# Patient Record
Sex: Male | Born: 1935 | Race: White | Hispanic: No | Marital: Married | Smoking: Former smoker
Health system: Southern US, Community
[De-identification: ages and names within clinical notes are randomized; demographics above are authoritative.]

## PROBLEM LIST (undated history)

## (undated) DIAGNOSIS — I5023 Acute on chronic systolic (congestive) heart failure: Secondary | ICD-10-CM

## (undated) DIAGNOSIS — J449 Chronic obstructive pulmonary disease, unspecified: Secondary | ICD-10-CM

## (undated) DIAGNOSIS — C189 Malignant neoplasm of colon, unspecified: Secondary | ICD-10-CM

## (undated) DIAGNOSIS — I219 Acute myocardial infarction, unspecified: Secondary | ICD-10-CM

## (undated) DIAGNOSIS — E785 Hyperlipidemia, unspecified: Secondary | ICD-10-CM

## (undated) DIAGNOSIS — I2510 Atherosclerotic heart disease of native coronary artery without angina pectoris: Secondary | ICD-10-CM

## (undated) DIAGNOSIS — I2589 Other forms of chronic ischemic heart disease: Secondary | ICD-10-CM

## (undated) HISTORY — DX: Hyperlipidemia, unspecified: E78.5

## (undated) HISTORY — DX: Other forms of chronic ischemic heart disease: I25.89

## (undated) HISTORY — PX: APPENDECTOMY: SHX54

## (undated) HISTORY — DX: Acute on chronic systolic (congestive) heart failure: I50.23

## (undated) HISTORY — DX: Atherosclerotic heart disease of native coronary artery without angina pectoris: I25.10

## (undated) HISTORY — PX: OTHER SURGICAL HISTORY: SHX169

## (undated) HISTORY — DX: Acute myocardial infarction, unspecified: I21.9

## (undated) HISTORY — DX: Malignant neoplasm of colon, unspecified: C18.9

## (undated) HISTORY — PX: TONSILLECTOMY: SUR1361

## (undated) HISTORY — DX: Chronic obstructive pulmonary disease, unspecified: J44.9

---

## 2006-07-06 DIAGNOSIS — C189 Malignant neoplasm of colon, unspecified: Secondary | ICD-10-CM

## 2006-07-06 HISTORY — DX: Malignant neoplasm of colon, unspecified: C18.9

## 2007-02-14 ENCOUNTER — Encounter: Admission: RE | Admit: 2007-02-14 | Discharge: 2007-02-14 | Payer: Self-pay | Admitting: Endocrinology

## 2007-04-29 ENCOUNTER — Encounter: Admission: RE | Admit: 2007-04-29 | Discharge: 2007-05-03 | Payer: Self-pay | Admitting: Surgery

## 2007-05-03 ENCOUNTER — Encounter (INDEPENDENT_AMBULATORY_CARE_PROVIDER_SITE_OTHER): Payer: Self-pay | Admitting: Surgery

## 2007-05-03 ENCOUNTER — Inpatient Hospital Stay (HOSPITAL_COMMUNITY): Admission: RE | Admit: 2007-05-03 | Discharge: 2007-05-07 | Payer: Self-pay | Admitting: Surgery

## 2007-05-12 ENCOUNTER — Ambulatory Visit: Payer: Self-pay | Admitting: Hematology & Oncology

## 2007-09-20 ENCOUNTER — Ambulatory Visit: Payer: Self-pay | Admitting: Hematology & Oncology

## 2007-09-22 LAB — CBC WITH DIFFERENTIAL/PLATELET
Eosinophils Absolute: 0.1 10*3/uL (ref 0.0–0.5)
HCT: 40.7 % (ref 38.7–49.9)
HGB: 14.1 g/dL (ref 13.0–17.1)
LYMPH%: 55.6 % — ABNORMAL HIGH (ref 14.0–48.0)
MONO#: 0.5 10*3/uL (ref 0.1–0.9)
NEUT#: 1.6 10*3/uL (ref 1.5–6.5)
Platelets: 128 10*3/uL — ABNORMAL LOW (ref 145–400)
RBC: 4.49 10*6/uL (ref 4.20–5.71)
WBC: 5.2 10*3/uL (ref 4.0–10.0)

## 2007-09-22 LAB — COMPREHENSIVE METABOLIC PANEL
Albumin: 3.4 g/dL — ABNORMAL LOW (ref 3.5–5.2)
CO2: 24 mEq/L (ref 19–32)
Glucose, Bld: 126 mg/dL — ABNORMAL HIGH (ref 70–99)
Sodium: 141 mEq/L (ref 135–145)
Total Bilirubin: 0.6 mg/dL (ref 0.3–1.2)
Total Protein: 6.3 g/dL (ref 6.0–8.3)

## 2007-09-22 LAB — CEA: CEA: <0.5 ng/mL (ref 0.0–5.0)

## 2007-09-26 ENCOUNTER — Ambulatory Visit (HOSPITAL_COMMUNITY): Admission: RE | Admit: 2007-09-26 | Discharge: 2007-09-26 | Payer: Self-pay | Admitting: Hematology & Oncology

## 2008-01-11 ENCOUNTER — Ambulatory Visit: Payer: Self-pay | Admitting: Hematology & Oncology

## 2008-01-16 ENCOUNTER — Ambulatory Visit (HOSPITAL_COMMUNITY): Admission: RE | Admit: 2008-01-16 | Discharge: 2008-01-16 | Payer: Self-pay | Admitting: Hematology & Oncology

## 2008-01-16 LAB — COMPREHENSIVE METABOLIC PANEL
ALT: 18 U/L (ref 0–53)
Albumin: 2.9 g/dL — ABNORMAL LOW (ref 3.5–5.2)
CO2: 29 mEq/L (ref 19–32)
Calcium: 9.3 mg/dL (ref 8.4–10.5)
Chloride: 107 mEq/L (ref 96–112)
Sodium: 140 mEq/L (ref 135–145)
Total Protein: 6.2 g/dL (ref 6.0–8.3)

## 2008-01-16 LAB — CBC WITH DIFFERENTIAL/PLATELET
BASO%: 0.7 % (ref 0.0–2.0)
Eosinophils Absolute: 0.2 10*3/uL (ref 0.0–0.5)
HCT: 41.5 % (ref 38.7–49.9)
MCHC: 34.1 g/dL (ref 32.0–35.9)
MONO#: 0.6 10*3/uL (ref 0.1–0.9)
NEUT#: 1.5 10*3/uL (ref 1.5–6.5)
RBC: 4.5 10*6/uL (ref 4.20–5.71)
WBC: 4.9 10*3/uL (ref 4.0–10.0)
lymph#: 2.6 10*3/uL (ref 0.9–3.3)

## 2008-01-16 LAB — CEA: CEA: 0.6 ng/mL (ref 0.0–5.0)

## 2008-01-24 ENCOUNTER — Ambulatory Visit: Payer: Self-pay | Admitting: Hematology & Oncology

## 2008-05-11 ENCOUNTER — Ambulatory Visit: Payer: Self-pay | Admitting: Hematology & Oncology

## 2008-05-14 ENCOUNTER — Ambulatory Visit (HOSPITAL_BASED_OUTPATIENT_CLINIC_OR_DEPARTMENT_OTHER): Admission: RE | Admit: 2008-05-14 | Discharge: 2008-05-14 | Payer: Self-pay | Admitting: Hematology & Oncology

## 2008-05-14 LAB — CBC WITH DIFFERENTIAL (CANCER CENTER ONLY)
BASO#: 0.1 10*3/uL (ref 0.0–0.2)
EOS%: 5.8 % (ref 0.0–7.0)
Eosinophils Absolute: 0.3 10*3/uL (ref 0.0–0.5)
HCT: 42.3 % (ref 38.7–49.9)
HGB: 14.2 g/dL (ref 13.0–17.1)
LYMPH#: 2.9 10*3/uL (ref 0.9–3.3)
MCH: 30.9 pg (ref 28.0–33.4)
MCHC: 33.7 g/dL (ref 32.0–35.9)
NEUT%: 29.1 % — ABNORMAL LOW (ref 40.0–80.0)
RBC: 4.6 10*6/uL (ref 4.20–5.70)

## 2008-05-14 LAB — CMP (CANCER CENTER ONLY)
ALT(SGPT): 19 U/L (ref 10–47)
CO2: 30 mEq/L (ref 18–33)
Calcium: 9.2 mg/dL (ref 8.0–10.3)
Chloride: 108 mEq/L (ref 98–108)
Creat: 1.4 mg/dl — ABNORMAL HIGH (ref 0.6–1.2)
Glucose, Bld: 93 mg/dL (ref 73–118)
Total Protein: 6.9 g/dL (ref 6.4–8.1)

## 2008-05-14 LAB — CEA: CEA: 0.5 ng/mL (ref 0.0–5.0)

## 2008-10-04 DIAGNOSIS — I219 Acute myocardial infarction, unspecified: Secondary | ICD-10-CM

## 2008-10-04 HISTORY — DX: Acute myocardial infarction, unspecified: I21.9

## 2008-10-22 ENCOUNTER — Ambulatory Visit: Payer: Self-pay | Admitting: Hematology & Oncology

## 2008-10-23 ENCOUNTER — Ambulatory Visit (HOSPITAL_BASED_OUTPATIENT_CLINIC_OR_DEPARTMENT_OTHER): Admission: RE | Admit: 2008-10-23 | Discharge: 2008-10-23 | Payer: Self-pay | Admitting: Hematology & Oncology

## 2008-10-23 ENCOUNTER — Ambulatory Visit: Payer: Self-pay | Admitting: Diagnostic Radiology

## 2008-10-23 LAB — CMP (CANCER CENTER ONLY)
AST: 27 U/L (ref 11–38)
Albumin: 3.2 g/dL — ABNORMAL LOW (ref 3.3–5.5)
Alkaline Phosphatase: 128 U/L — ABNORMAL HIGH (ref 26–84)
BUN, Bld: 18 mg/dL (ref 7–22)
Potassium: 5 mEq/L — ABNORMAL HIGH (ref 3.3–4.7)
Sodium: 144 mEq/L (ref 128–145)

## 2008-10-23 LAB — CBC WITH DIFFERENTIAL (CANCER CENTER ONLY)
BASO#: 0 10*3/uL (ref 0.0–0.2)
BASO%: 0.6 % (ref 0.0–2.0)
HCT: 46 % (ref 38.7–49.9)
HGB: 15.3 g/dL (ref 13.0–17.1)
LYMPH#: 3.4 10*3/uL — ABNORMAL HIGH (ref 0.9–3.3)
MONO#: 0.4 10*3/uL (ref 0.1–0.9)
NEUT%: 23.9 % — ABNORMAL LOW (ref 40.0–80.0)
WBC: 5.2 10*3/uL (ref 4.0–10.0)

## 2009-01-03 LAB — HM COLONOSCOPY: HM Colonoscopy: NORMAL

## 2009-03-25 ENCOUNTER — Encounter: Admission: RE | Admit: 2009-03-25 | Discharge: 2009-03-25 | Payer: Self-pay | Admitting: Endocrinology

## 2009-03-26 ENCOUNTER — Encounter: Admission: RE | Admit: 2009-03-26 | Discharge: 2009-03-26 | Payer: Self-pay | Admitting: Endocrinology

## 2009-04-05 ENCOUNTER — Ambulatory Visit: Payer: Self-pay | Admitting: Internal Medicine

## 2009-04-05 ENCOUNTER — Inpatient Hospital Stay (HOSPITAL_COMMUNITY): Admission: AD | Admit: 2009-04-05 | Discharge: 2009-04-10 | Payer: Self-pay | Admitting: Internal Medicine

## 2009-04-06 ENCOUNTER — Encounter (INDEPENDENT_AMBULATORY_CARE_PROVIDER_SITE_OTHER): Payer: Self-pay | Admitting: Internal Medicine

## 2009-04-06 LAB — CONVERTED CEMR LAB
Cholesterol: 130 mg/dL
HDL: 40 mg/dL
LDL Cholesterol: 81 mg/dL

## 2009-04-08 DIAGNOSIS — I2510 Atherosclerotic heart disease of native coronary artery without angina pectoris: Secondary | ICD-10-CM

## 2009-04-08 HISTORY — DX: Atherosclerotic heart disease of native coronary artery without angina pectoris: I25.10

## 2009-04-09 ENCOUNTER — Encounter: Payer: Self-pay | Admitting: Cardiovascular Disease

## 2009-04-11 ENCOUNTER — Telehealth: Payer: Self-pay | Admitting: Cardiovascular Disease

## 2009-04-18 ENCOUNTER — Telehealth: Payer: Self-pay | Admitting: Cardiovascular Disease

## 2009-04-23 DIAGNOSIS — Z87898 Personal history of other specified conditions: Secondary | ICD-10-CM

## 2009-04-23 DIAGNOSIS — I5042 Chronic combined systolic (congestive) and diastolic (congestive) heart failure: Secondary | ICD-10-CM | POA: Insufficient documentation

## 2009-04-23 DIAGNOSIS — E785 Hyperlipidemia, unspecified: Secondary | ICD-10-CM

## 2009-04-23 DIAGNOSIS — J439 Emphysema, unspecified: Secondary | ICD-10-CM

## 2009-04-23 DIAGNOSIS — I219 Acute myocardial infarction, unspecified: Secondary | ICD-10-CM | POA: Insufficient documentation

## 2009-04-24 ENCOUNTER — Encounter: Payer: Self-pay | Admitting: Internal Medicine

## 2009-04-29 ENCOUNTER — Ambulatory Visit: Payer: Self-pay | Admitting: Cardiovascular Disease

## 2009-04-29 DIAGNOSIS — I2510 Atherosclerotic heart disease of native coronary artery without angina pectoris: Secondary | ICD-10-CM

## 2009-05-01 ENCOUNTER — Telehealth: Payer: Self-pay | Admitting: Cardiovascular Disease

## 2009-05-06 DIAGNOSIS — J449 Chronic obstructive pulmonary disease, unspecified: Secondary | ICD-10-CM

## 2009-05-06 HISTORY — DX: Chronic obstructive pulmonary disease, unspecified: J44.9

## 2009-05-07 ENCOUNTER — Encounter: Payer: Self-pay | Admitting: Cardiovascular Disease

## 2009-05-14 ENCOUNTER — Telehealth: Payer: Self-pay | Admitting: Cardiovascular Disease

## 2009-05-20 ENCOUNTER — Ambulatory Visit: Payer: Self-pay | Admitting: Cardiovascular Disease

## 2009-05-20 ENCOUNTER — Encounter: Payer: Self-pay | Admitting: Cardiovascular Disease

## 2009-05-20 ENCOUNTER — Ambulatory Visit: Payer: Self-pay | Admitting: Pulmonary Disease

## 2009-05-20 ENCOUNTER — Inpatient Hospital Stay (HOSPITAL_COMMUNITY): Admission: AD | Admit: 2009-05-20 | Discharge: 2009-05-23 | Payer: Self-pay | Admitting: Cardiovascular Disease

## 2009-05-21 ENCOUNTER — Encounter: Payer: Self-pay | Admitting: Cardiovascular Disease

## 2009-05-22 ENCOUNTER — Encounter: Payer: Self-pay | Admitting: Cardiovascular Disease

## 2009-05-23 LAB — CONVERTED CEMR LAB
Hemoglobin: 12.4 g/dL
MCV: 94.2 fL
Platelets: 144 10*3/uL

## 2009-05-29 ENCOUNTER — Ambulatory Visit: Payer: Self-pay | Admitting: Cardiovascular Disease

## 2009-05-29 DIAGNOSIS — I5023 Acute on chronic systolic (congestive) heart failure: Secondary | ICD-10-CM | POA: Insufficient documentation

## 2009-06-04 ENCOUNTER — Encounter: Payer: Self-pay | Admitting: Cardiovascular Disease

## 2009-06-04 LAB — CONVERTED CEMR LAB
Calcium: 8.5 mg/dL (ref 8.4–10.5)
GFR calc non Af Amer: 44.21 mL/min (ref >60)
Potassium: 4.3 meq/L (ref 3.5–5.1)
Sodium: 135 meq/L (ref 135–145)

## 2009-06-06 ENCOUNTER — Ambulatory Visit: Payer: Self-pay | Admitting: Hematology & Oncology

## 2009-06-07 ENCOUNTER — Ambulatory Visit: Payer: Self-pay | Admitting: Diagnostic Radiology

## 2009-06-07 ENCOUNTER — Ambulatory Visit (HOSPITAL_BASED_OUTPATIENT_CLINIC_OR_DEPARTMENT_OTHER): Admission: RE | Admit: 2009-06-07 | Discharge: 2009-06-07 | Payer: Self-pay | Admitting: Hematology & Oncology

## 2009-06-07 LAB — CBC WITH DIFFERENTIAL (CANCER CENTER ONLY)
BASO%: 0.5 % (ref 0.0–2.0)
HCT: 41.5 % (ref 38.7–49.9)
LYMPH#: 2.2 10*3/uL (ref 0.9–3.3)
MONO#: 0.6 10*3/uL (ref 0.1–0.9)
NEUT#: 2.4 10*3/uL (ref 1.5–6.5)
Platelets: 98 10*3/uL — ABNORMAL LOW (ref 145–400)
RDW: 12.5 % (ref 10.5–14.6)
WBC: 5.6 10*3/uL (ref 4.0–10.0)

## 2009-06-07 LAB — CMP (CANCER CENTER ONLY)
AST: 28 U/L (ref 11–38)
Albumin: 3.3 g/dL (ref 3.3–5.5)
BUN, Bld: 15 mg/dL (ref 7–22)
Calcium: 9.3 mg/dL (ref 8.0–10.3)
Chloride: 100 mEq/L (ref 98–108)
Glucose, Bld: 96 mg/dL (ref 73–118)
Potassium: 4.1 mEq/L (ref 3.3–4.7)

## 2009-06-10 ENCOUNTER — Telehealth (INDEPENDENT_AMBULATORY_CARE_PROVIDER_SITE_OTHER): Payer: Self-pay | Admitting: *Deleted

## 2009-06-14 ENCOUNTER — Ambulatory Visit: Payer: Self-pay | Admitting: Pulmonary Disease

## 2009-06-14 DIAGNOSIS — J90 Pleural effusion, not elsewhere classified: Secondary | ICD-10-CM | POA: Insufficient documentation

## 2009-06-14 DIAGNOSIS — G4710 Hypersomnia, unspecified: Secondary | ICD-10-CM | POA: Insufficient documentation

## 2009-07-03 ENCOUNTER — Ambulatory Visit: Payer: Self-pay | Admitting: Internal Medicine

## 2009-07-03 DIAGNOSIS — Z87891 Personal history of nicotine dependence: Secondary | ICD-10-CM

## 2009-07-03 DIAGNOSIS — J438 Other emphysema: Secondary | ICD-10-CM

## 2009-07-10 ENCOUNTER — Ambulatory Visit (HOSPITAL_COMMUNITY): Admission: RE | Admit: 2009-07-10 | Discharge: 2009-07-10 | Payer: Self-pay | Admitting: Cardiovascular Disease

## 2009-07-10 ENCOUNTER — Encounter: Payer: Self-pay | Admitting: Cardiovascular Disease

## 2009-07-10 ENCOUNTER — Ambulatory Visit: Payer: Self-pay | Admitting: Cardiology

## 2009-07-10 ENCOUNTER — Ambulatory Visit: Payer: Self-pay

## 2009-07-10 ENCOUNTER — Ambulatory Visit: Payer: Self-pay | Admitting: Cardiovascular Disease

## 2009-07-10 LAB — CONVERTED CEMR LAB
BUN: 24 mg/dL — ABNORMAL HIGH (ref 6–23)
Chloride: 105 meq/L (ref 96–112)
GFR calc non Af Amer: 51.56 mL/min (ref >60)
Potassium: 3.9 meq/L (ref 3.5–5.1)
Sodium: 140 meq/L (ref 135–145)

## 2009-07-11 ENCOUNTER — Encounter: Payer: Self-pay | Admitting: Cardiovascular Disease

## 2009-07-17 ENCOUNTER — Telehealth: Payer: Self-pay | Admitting: Pulmonary Disease

## 2009-07-29 ENCOUNTER — Telehealth: Payer: Self-pay | Admitting: Cardiovascular Disease

## 2009-07-29 ENCOUNTER — Telehealth (INDEPENDENT_AMBULATORY_CARE_PROVIDER_SITE_OTHER): Payer: Self-pay | Admitting: *Deleted

## 2009-08-13 ENCOUNTER — Ambulatory Visit: Payer: Self-pay | Admitting: Pulmonary Disease

## 2009-08-16 ENCOUNTER — Telehealth (INDEPENDENT_AMBULATORY_CARE_PROVIDER_SITE_OTHER): Payer: Self-pay | Admitting: *Deleted

## 2009-09-16 ENCOUNTER — Telehealth: Payer: Self-pay | Admitting: Cardiovascular Disease

## 2009-10-01 ENCOUNTER — Ambulatory Visit: Payer: Self-pay | Admitting: Internal Medicine

## 2009-10-01 LAB — CONVERTED CEMR LAB
ALT: 15 units/L (ref 0–53)
AST: 22 units/L (ref 0–37)
Alkaline Phosphatase: 83 units/L (ref 39–117)
Bilirubin, Direct: 0.1 mg/dL (ref 0.0–0.3)
Total Bilirubin: 0.8 mg/dL (ref 0.3–1.2)

## 2009-12-12 ENCOUNTER — Ambulatory Visit: Payer: Self-pay | Admitting: Pulmonary Disease

## 2010-01-09 ENCOUNTER — Ambulatory Visit: Payer: Self-pay | Admitting: Cardiovascular Disease

## 2010-01-17 ENCOUNTER — Telehealth: Payer: Self-pay | Admitting: Cardiovascular Disease

## 2010-01-28 ENCOUNTER — Ambulatory Visit: Payer: Self-pay | Admitting: Internal Medicine

## 2010-04-02 ENCOUNTER — Telehealth: Payer: Self-pay | Admitting: Cardiovascular Disease

## 2010-04-09 ENCOUNTER — Telehealth: Payer: Self-pay | Admitting: Internal Medicine

## 2010-04-10 ENCOUNTER — Encounter: Payer: Self-pay | Admitting: Internal Medicine

## 2010-04-14 ENCOUNTER — Ambulatory Visit: Payer: Self-pay | Admitting: Pulmonary Disease

## 2010-05-05 ENCOUNTER — Ambulatory Visit: Payer: Self-pay | Admitting: Cardiovascular Disease

## 2010-05-20 ENCOUNTER — Ambulatory Visit: Payer: Self-pay | Admitting: Internal Medicine

## 2010-05-20 DIAGNOSIS — L259 Unspecified contact dermatitis, unspecified cause: Secondary | ICD-10-CM

## 2010-05-23 LAB — CONVERTED CEMR LAB
HDL: 44.3 mg/dL (ref >39.00)
LDL Cholesterol: 90 mg/dL (ref 0–99)
Total CHOL/HDL Ratio: 3
Triglycerides: 62 mg/dL (ref 0.0–149.0)
VLDL: 12.4 mg/dL (ref 0.0–40.0)

## 2010-06-04 IMAGING — CR DG CHEST 2V
2 series · 2 of 2 positions shown · non-contrast
Comparison: Chest radiograph 04/05/2009

CLINICAL DATA: Congestive heart failure

CHEST - 2 VIEW

[w chest pa]
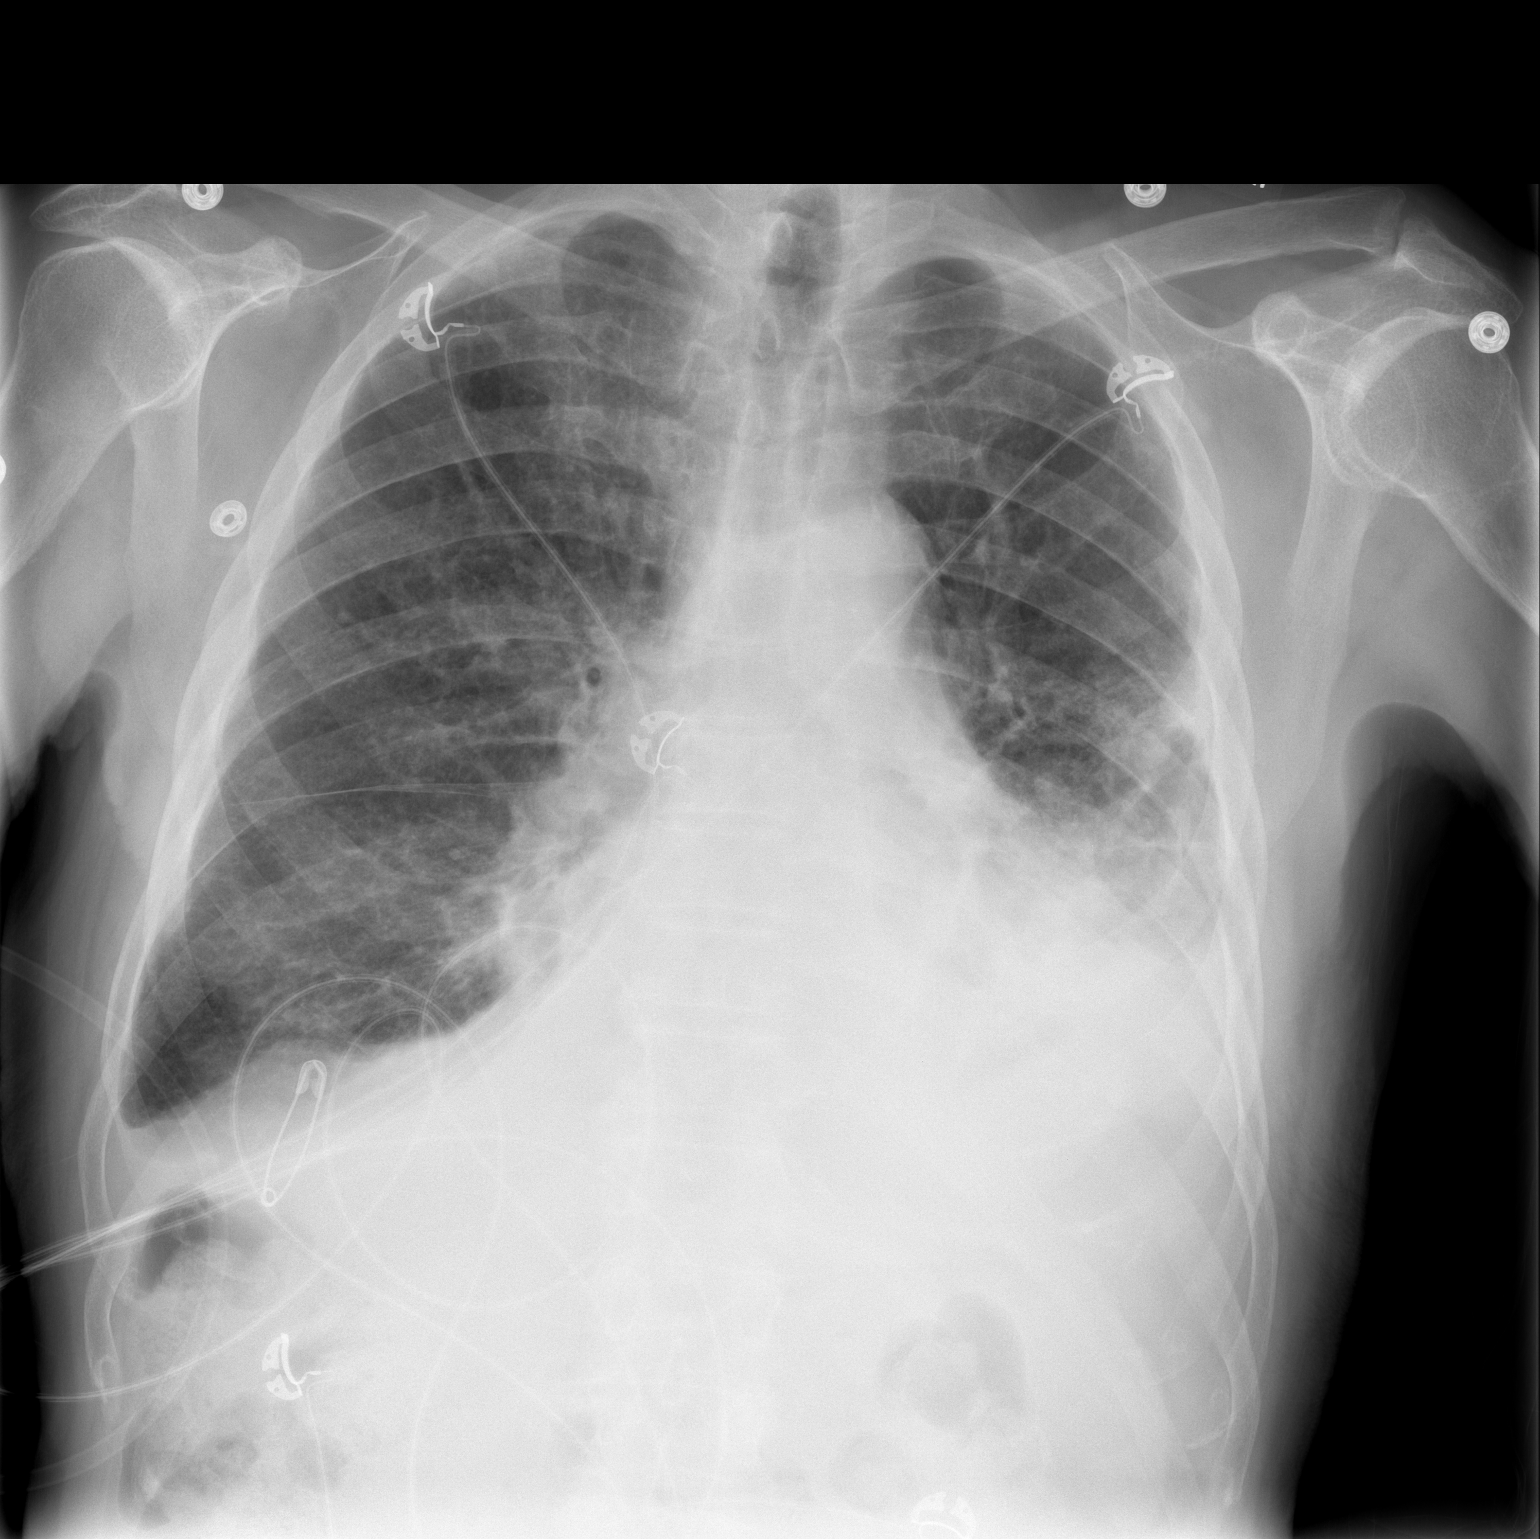

[w chest lat]
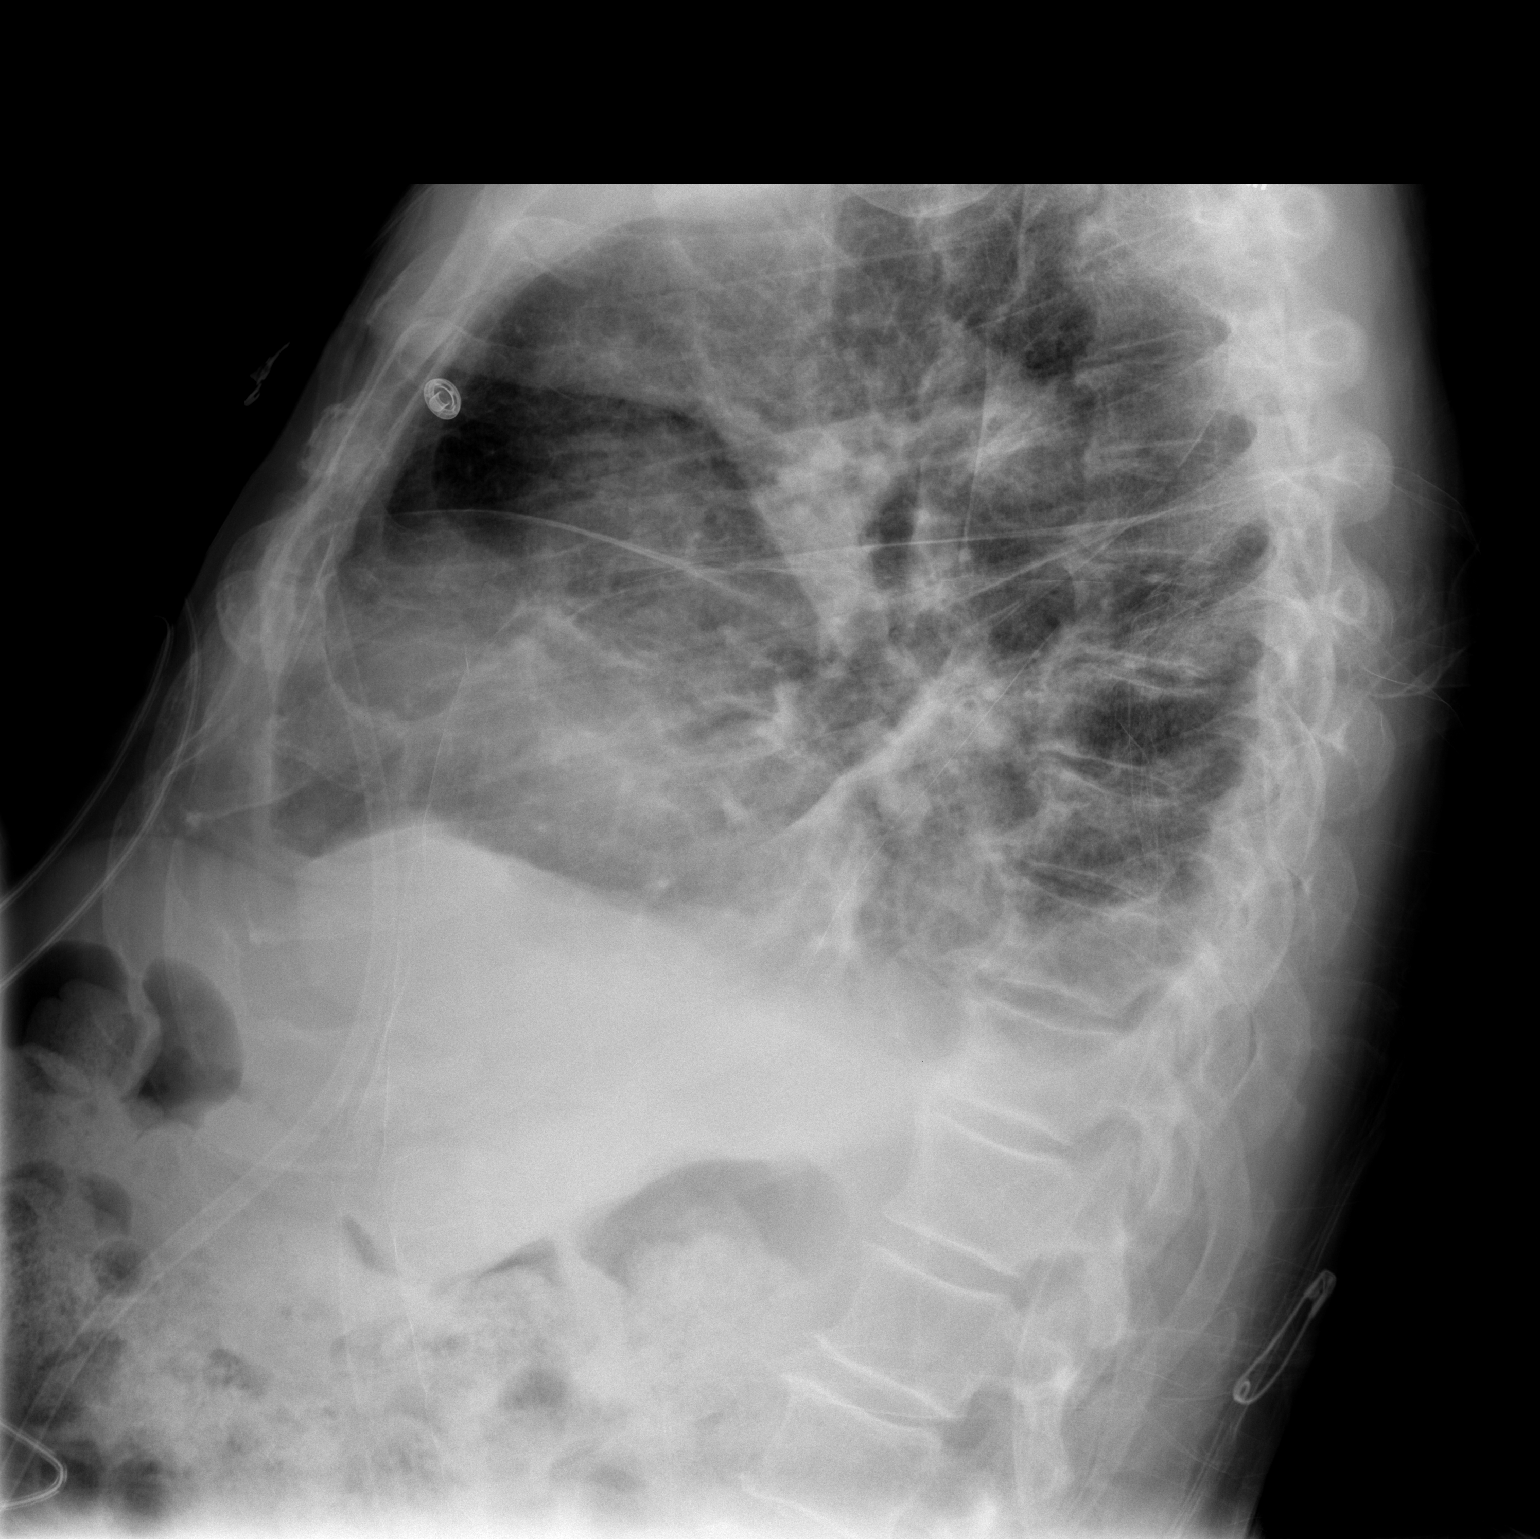

[2 of 2 positions shown; findings below may reference images not displayed]

FINDINGS: Stable enlarged cardiac silhouette.  There is left lower
lobe atelectasis and effusion not changed from prior.  Interval
improvement in atelectasis the right lung base.  No pneumothorax.
IMPRESSION: 1.  Improved aeration of the right lung base.
2.  Persistent dense left lower lobe atelectasis and effusion.

## 2010-07-04 ENCOUNTER — Telehealth: Payer: Self-pay | Admitting: Internal Medicine

## 2010-08-05 NOTE — Progress Notes (Signed)
Summary: refill  Phone Note Call from Patient   Caller: Spouse Call For: Cybil Senegal Summary of Call: Wants to know if he needs a refill on his benzonatate. Initial call taken by: Darletta Moll,  July 17, 2009 10:23 AM  Follow-up for Phone Call        Pt wife states that the pt cough is much improved, he still coughs but not very often. She wanted to know if they needed to refill benzonatate. I advised if his cough is better then he doesnt need the med. Pt wife wanted me to ask RA what his recs were. Please advise. Carron Curie CMA  July 17, 2009 11:03 AM   Additional Follow-up for Phone Call Additional follow up Details #1::        OK x 3 months Additional Follow-up by: Comer Locket. Vassie Loll MD,  July 17, 2009 12:15 PM    Additional Follow-up for Phone Call Additional follow up Details #2::    wife aware med at the pharmacy, she also wanted to know if he needed to take med even if he was not coughing and i told wife to have pt use as needed for cough and discuss further with dr Vassie Loll at [facility] in feb Follow-up by: Philipp Deputy CMA,  July 17, 2009 2:17 PM  New/Updated Medications: BENZONATATE 200 MG CAPS (BENZONATATE) 1 by mouth at bedtime as needed for cough Prescriptions: BENZONATATE 200 MG CAPS (BENZONATATE) 1 by mouth at bedtime as needed for cough  #30 x 2   Entered by:   Philipp Deputy CMA   Authorized by:   Comer Locket Vassie Loll MD   Signed by:   Philipp Deputy CMA on 07/17/2009   Method used:   Electronically to        [facility]. [phone]* (retail)       [address] [place], [place]  [address]       Ph: [phone]       Fax: [phone]   RxID:   [id number]

## 2010-08-05 NOTE — Progress Notes (Signed)
Summary: RX QUESTIONS  Phone Note From Pharmacy Call back at [phone]   Caller: [facility] Call For: [facility]  Summary of Call: QUESTIONS ABOUT PRESCRIPTIONS THAT WERE RECENTLY CALLED IN. Initial call taken by: Harlon Flor,  September 16, 2009 2:12 PM  Follow-up for Phone Call        [facility] pharmacy. They did not know why Jonny Ruiz called and will have him call us back. Dossie Arbour, RN, BSN  September 16, 2009 3:54 PM Spoke with pharmacy. Clarified that  Lasix is 20 mg by mouth daily Follow-up by: Dossie Arbour, RN, BSN,  September 16, 2009 4:43 PM

## 2010-08-05 NOTE — Progress Notes (Signed)
Summary: CALLING ABOUT MEDICATION INHALER  Phone Note Call from Patient Call back at Home Phone [phone]   Caller: Patient Summary of Call: THE MEDICATION SPIRIVA THE PT WOULD LIKE THE PILLS NOT THE INHALER Initial call taken by: Judie Grieve,  July 29, 2009 9:34 AM  Follow-up for Phone Call        Spoke with pt's wife and asked her to contact Dr. Reginia Naas office for this as it is a pulmonary medicine. She agrees to do this Follow-up by: Dossie Arbour, RN, BSN,  July 29, 2009 9:56 AM

## 2010-08-05 NOTE — Assessment & Plan Note (Signed)
Summary: 4 MOS F/U // # / CD   Vital Signs:  Patient profile:   77 year old male Height:      64 inches (162.56 cm) Weight:      156.75 pounds (71.25 kg) BMI:     27.00 O2 Sat:      92 % on Room air Temp:     98.1 degrees F (36.72 degrees C) oral Pulse rate:   75 / minute BP sitting:   108 / 60  (left arm) Cuff size:   regular  Vitals Entered By: Brenton Grills MA (January 28, 2010 9:58 AM)  O2 Flow:  Room air CC: 4 mo F/U/aj   Primary Care Provider:  Rene Paci, MD  CC:  4 mo F/U/aj.  History of Present Illness: here for 3-6 mo followup - wife and dtr asst with hx as needed   1) COPD hx - feels breathing is baseline today much improved since hosp fall 2010 -  attributes Spiriva to his improved symptoms - no need for rescue MDI use chronic mild cough; no SOB, DOE or PND reports compliance with ongoing medical treatment and no changes in medication dose or frequency. denies adverse side effects related to current therapy.   2) NSTEMI 10/10 with CAD and Isch CM (chronic HF)- denies cp, pnd; improvement in chronic peiph edema - reports compliance with ongoing medical treatment and no changes in medication dose or frequency. denies adverse side effects related to current therapy. ?if able to reduce costs (in donute hole, esp plavix)  3) hx colon ca, s/p surg resection 2008 no bowel changes or diarrhea; no weight changes or hematocezia released by onc this year -   4) dyslipidemia- reports compliance with ongoing medical treatment and no changes in medication dose or frequency. denies adverse side effects related to current therapy. no muscle or GI problems   Clinical Review Panels:  Prevention   Last Colonoscopy:  Repeat colonoscopy by Dr. Kinnie Scales Results: Normal, scedule to repeat in 3 years (01/03/2009)  Immunizations   Last Tetanus Booster:  Td (07/03/2009)   Last Flu Vaccine:  Historical (04/04/2009)   Last Pneumovax:  Pneumovax (Medicare)  (07/03/2009)  Lipid Management   Cholesterol:  130 (04/06/2009)   LDL (bad choesterol):  81 (04/06/2009)   HDL (good cholesterol):  40 (04/06/2009)   Triglycerides:  43 (04/06/2009)  CBC   WBC:  12.4 (05/23/2009)   RBC:  3.88 (05/23/2009)   Hgb:  12.4 (05/23/2009)   Hct:  36.6 (05/23/2009)   Platelets:  144 (05/23/2009)   MCV  94.2 (05/23/2009)   RDW  14.5 (05/23/2009)  Complete Metabolic Panel   Glucose:  86 (07/10/2009)   Sodium:  140 (07/10/2009)   Potassium:  4.6 (10/01/2009)   Chloride:  105 (07/10/2009)   CO2:  28 (07/10/2009)   BUN:  24 (07/10/2009)   Creatinine:  1.5 (10/01/2009)   Albumin:  3.3 (10/01/2009)   Total Protein:  6.5 (10/01/2009)   Calcium:  9.2 (07/10/2009)   Total Bili:  0.8 (10/01/2009)   Alk Phos:  83 (10/01/2009)   SGPT (ALT):  15 (10/01/2009)   SGOT (AST):  22 (10/01/2009)   Current Medications (verified): 1)  Aspirin Ec 325 Mg Tbec (Aspirin) .... Take One Tablet By Mouth Daily 2)  Plavix 75 Mg Tabs (Clopidogrel Bisulfate) .Marland Kitchen.. 1 Tab Once Daily 3)  Crestor 10 Mg Tabs (Rosuvastatin Calcium) .Marland Kitchen.. 1 Tab At Bedtime 4)  Ventolin Hfa 108 (90 Base) Mcg/act  Aers (Albuterol Sulfate) .Marland KitchenMarland KitchenMarland Kitchen  1-2 Puffs Every 4-6 Hours As Needed 5)  Vitamin B-12 100 Mcg Tabs (Cyanocobalamin) .Marland Kitchen.. 1 Tab Once Daily 6)  Vitamin C 500 Mg Tabs (Ascorbic Acid) .Marland Kitchen.. 1 Tab Once Daily 7)  Vitamin D 2000 Unit Tabs (Cholecalciferol) .... Take 1 Tablet By Mouth Once A Day 8)  Vitamin E 400 Unit Caps (Vitamin E) .Marland Kitchen.. 1 Cap Once Daily 9)  Spiriva Handihaler 18 Mcg Caps (Tiotropium Bromide Monohydrate) .... Once A Day 10)  Furosemide 20 Mg Tabs (Furosemide) .... Take One Tablet By Mouth Daily. 11)  Potassium Chloride Crys Cr 20 Meq Cr-Tabs (Potassium Chloride Crys Cr) .... Take One Tablet By Mouth Daily 12)  Benzonatate 200 Mg Caps (Benzonatate) .Marland Kitchen.. 1 By Mouth At Bedtime As Needed For Cough  Allergies (verified): No Known Drug Allergies  Past History:  Past Medical  History: NonSTEMI  10/10  CAD, Native vessel s/p overlapping Xience DES stents mid Circumflex 04/08/09. CARDIOMYOPATHY, ISCHEMIC (ICD-414.8) DYSLIPIDEMIA (ICD-272.4) COPD (ICD-496) with exacerbation 11/10  BENIGN PROSTATIC HYPERTROPHY, HX OF  ADENOCARCINOMA, COLON, HX OF    MD roster: cards- McAlhany pulm- Vassie Loll GI- Medoff onc - Ennever  Review of Systems  The patient denies weight loss, hoarseness, chest pain, and prolonged cough.    Physical Exam  General:  alert, well-developed, well-nourished, and cooperative to examination.   wife and dtr at side Lungs:  normal respiratory effort, no intercostal retractions or use of accessory muscles; decreased breath sounds at bases bilaterally - but no crackles and no wheezes.    Heart:  normal rate, regular rhythm, no murmur, and no rub. BLE with trace chronic edema.  Psych:  Oriented X3, memory intact for recent and remote, normally interactive, good eye contact, not anxious appearing, not depressed appearing, and not agitated.      Impression & Recommendations:  Problem # 1:  CAD, NATIVE VESSEL (ICD-414.01)  His updated medication list for this problem includes:    Aspirin Ec 325 Mg Tbec (Aspirin) .Marland Kitchen... Take one tablet by mouth daily    Plavix 75 Mg Tabs (Clopidogrel bisulfate) .Marland Kitchen... 1 tab once daily    Furosemide 20 Mg Tabs (Furosemide) .Marland Kitchen... Take one tablet by mouth daily.  per cards OV earlier this month: Stable. Wlll need one full year of ASA/Plavix. (stents October 2010). He has not tolerated beta blockers secondary to hypotension.  family interested in stopping Plavix asap (if able) due to cost - will ask cards to f/u in same oct when due -  Labs Reviewed: Chol: 130 (04/06/2009)   HDL: 40 (04/06/2009)   LDL: 81 (04/06/2009)   TG: 43 (04/06/2009)  Problem # 2:  DYSLIPIDEMIA (ICD-272.4)  His updated medication list for this problem includes:    Crestor 10 Mg Tabs (Rosuvastatin calcium) .Marland Kitchen... 1 tab at bedtime  Labs  Reviewed: SGOT: 22 (10/01/2009)   SGPT: 15 (10/01/2009)   HDL:40 (04/06/2009)  LDL:81 (04/06/2009)  Chol:130 (04/06/2009)  Trig:43 (04/06/2009)  Problem # 3:  COPD (ICD-496)  much improved on spiriva - no alb rescue mdi needed- f/u pulm as ongoing His updated medication list for this problem includes:    Ventolin Hfa 108 (90 Base) Mcg/act Aers (Albuterol sulfate) .Marland Kitchen... 1-2 puffs every 4-6 hours as needed    Spiriva Handihaler 18 Mcg Caps (Tiotropium bromide monohydrate) ..... Once a day  Pulmonary Functions Reviewed: O2 sat: 92 (01/28/2010)     Time spent with patient and family 25 minutes, more than 50% of this time was spent counseling patient on medication review and  last labs  Vaccines Reviewed: Pneumovax: Pneumovax (Medicare) (07/03/2009)   Flu Vax: Historical (04/04/2009)  Complete Medication List: 1)  Aspirin Ec 325 Mg Tbec (Aspirin) .... Take one tablet by mouth daily 2)  Plavix 75 Mg Tabs (Clopidogrel bisulfate) .Marland Kitchen.. 1 tab once daily 3)  Crestor 10 Mg Tabs (Rosuvastatin calcium) .Marland Kitchen.. 1 tab at bedtime 4)  Ventolin Hfa 108 (90 Base) Mcg/act Aers (Albuterol sulfate) .Marland Kitchen.. 1-2 puffs every 4-6 hours as needed 5)  Vitamin B-12 100 Mcg Tabs (Cyanocobalamin) .Marland Kitchen.. 1 tab once daily 6)  Vitamin C 500 Mg Tabs (Ascorbic acid) .Marland Kitchen.. 1 tab once daily 7)  Vitamin D 2000 Unit Tabs (Cholecalciferol) .... Take 1 tablet by mouth once a day 8)  Vitamin E 400 Unit Caps (Vitamin e) .Marland Kitchen.. 1 cap once daily 9)  Spiriva Handihaler 18 Mcg Caps (Tiotropium bromide monohydrate) .... Once a day 10)  Furosemide 20 Mg Tabs (Furosemide) .... Take one tablet by mouth daily. 11)  Potassium Chloride Crys Cr 20 Meq Cr-tabs (Potassium chloride crys cr) .... Take one tablet by mouth daily 12)  Benzonatate 200 Mg Caps (Benzonatate) .Marland Kitchen.. 1 by mouth at bedtime as needed for cough  Patient Instructions: 1)  it was good to see you today. 2)  no medication changes - keep doing what you are doing 3)  Please schedule a  follow-up appointment in 4-6 months, sooner if problems.  will plan cholesterol labs at that visit (unless done elsewhere before then) so come fasting as needed  4)  will contact your cardiologist about stopping the Plavix in October (12 months after stent placed) - call them if questions about this prior to that time

## 2010-08-05 NOTE — Assessment & Plan Note (Signed)
Summary: 3-6 MTH FU---STC   Vital Signs:  Patient profile:   77 year old male Height:      64 inches (162.56 cm) Weight:      150.6 pounds (68.45 kg) O2 Sat:      97 % on Room air Temp:     97.8 degrees F (36.56 degrees C) oral Pulse rate:   811 / minute BP sitting:   102 / 60  (left arm) Cuff size:   regular  Vitals Entered By: Orlan Leavens (October 01, 2009 10:24 AM)  O2 Flow:  Room air CC: 3-6 MONTH FOLLOW-UP Is Patient Diabetic? No Pain Assessment Patient in pain? no        Primary Care Provider:  Rene Paci, MD  CC:  3-6 MONTH FOLLOW-UP.  History of Present Illness: here for 3-6 mo followup -  1) COPD hx - feels breathing is baseline today much improved since hosp this fall -  attributes Spiriva to his improved symptoms  chorinc cough - but improved; no SOB, DOE or PND reports compliance with ongoing medical treatment and no changes in medication dose or frequency. denies adverse side effects related to current therapy.   2) NSTEMI 10/10 with CAD and Isch CM (chronic HF) denies cp, pnd; improvement in chronic peiph edema - reports compliance with ongoing medical treatment and no changes in medication dose or frequency. denies adverse side effects related to current therapy.   3) hx colon ca, s/p surg rescetion 2008 no bowel changes or diarrhea released by onc this year -   4) dyslipidemia- reports compliance with ongoing medical treatment and no changes in medication dose or frequency. denies adverse side effects related to current therapy.   Clinical Review Panels:  Prevention   Last Colonoscopy:  Repeat colonoscopy by Dr. Kinnie Scales Results: Normal, scedule to repeat in 3 years (01/03/2009)  Immunizations   Last Tetanus Booster:  Td (07/03/2009)   Last Flu Vaccine:  Historical (04/04/2009)   Last Pneumovax:  Pneumovax (Medicare) (07/03/2009)  Lipid Management   Cholesterol:  130 (04/06/2009)   LDL (bad choesterol):  81 (04/06/2009)   HDL (good  cholesterol):  40 (04/06/2009)   Triglycerides:  43 (04/06/2009)  CBC   WBC:  12.4 (05/23/2009)   RBC:  3.88 (05/23/2009)   Hgb:  12.4 (05/23/2009)   Hct:  36.6 (05/23/2009)   Platelets:  144 (05/23/2009)   MCV  94.2 (05/23/2009)   RDW  14.5 (05/23/2009)  Complete Metabolic Panel   Glucose:  86 (07/10/2009)   Sodium:  140 (07/10/2009)   Potassium:  3.9 (07/10/2009)   Chloride:  105 (07/10/2009)   CO2:  28 (07/10/2009)   BUN:  24 (07/10/2009)   Creatinine:  1.4 (07/10/2009)   Calcium:  9.2 (07/10/2009)   Current Medications (verified): 1)  Aspirin Ec 325 Mg Tbec (Aspirin) .... Take One Tablet By Mouth Daily 2)  Plavix 75 Mg Tabs (Clopidogrel Bisulfate) .Marland Kitchen.. 1 Tab Once Daily 3)  Crestor 10 Mg Tabs (Rosuvastatin Calcium) .Marland Kitchen.. 1 Tab At Bedtime 4)  Ventolin Hfa 108 (90 Base) Mcg/act  Aers (Albuterol Sulfate) .Marland Kitchen.. 1-2 Puffs Every 4-6 Hours As Needed 5)  Vitamin B-12 100 Mcg Tabs (Cyanocobalamin) .Marland Kitchen.. 1 Tab Once Daily 6)  Vitamin C 500 Mg Tabs (Ascorbic Acid) .Marland Kitchen.. 1 Tab Once Daily 7)  Vitamin D 1000 Unit Tabs (Cholecalciferol) .Marland Kitchen.. 1 Tab Once Daily 8)  Vitamin E 400 Unit Caps (Vitamin E) .Marland Kitchen.. 1 Cap Once Daily 9)  Spiriva Handihaler 18 Mcg Caps (Tiotropium  Bromide Monohydrate) .... Once A Day 10)  Furosemide 20 Mg Tabs (Furosemide) .... Take One Tablet By Mouth Daily. 11)  Potassium Chloride Crys Cr 20 Meq Cr-Tabs (Potassium Chloride Crys Cr) .... Take One Tablet By Mouth Daily 12)  Benzonatate 200 Mg Caps (Benzonatate) .Marland Kitchen.. 1 By Mouth At Bedtime As Needed For Cough  Allergies (verified): No Known Drug Allergies  Past History:  Past Medical History: NonSTEMI  10/10  CAD, Native vessel s/p overlapping Xience DES stents mid Circumflex 04/08/09. CARDIOMYOPATHY, ISCHEMIC (ICD-414.8) DYSLIPIDEMIA (ICD-272.4) COPD (ICD-496) with exacerbation 11/10 BENIGN PROSTATIC HYPERTROPHY, HX OF  ADENOCARCINOMA, COLON, HX OF    MD roster: cards- [facility] pulm- Vassie Loll GI- Medoff onc -  Ennever  Review of Systems  The patient denies fever, chest pain, syncope, and headaches.    Physical Exam  General:  alert, well-developed, well-nourished, and cooperative to examination.   wife and dtr at side Lungs:  normal respiratory effort, no intercostal retractions or use of accessory muscles; decreased breath sounds at bases bilaterally - but no crackles and no wheezes.    Heart:  normal rate, regular rhythm, no murmur, and no rub. BLE with trace chronic edema.  Neurologic:  alert & oriented X3 and cranial nerves II-XII symetrically intact.  strength normal in all extremities, sensation intact to light touch, and gait normal. speech fluent without dysarthria or aphasia; follows commands with good comprehension.  Psych:  Oriented X3, memory intact for recent and remote, normally interactive, good eye contact, not anxious appearing, not depressed appearing, and not agitated.      Impression & Recommendations:  Problem # 1:  SYSTOLIC HEART FAILURE, ACUTE ON CHRONIC (ICD-428.23)  compensated at this time -  cont med mgmt - check labs to ensure not overdiuresed given low normal BP with sig less edema than usual on exam cont mgmt as per cards His updated medication list for this problem includes:    Aspirin Ec 325 Mg Tbec (Aspirin) .Marland Kitchen... Take one tablet by mouth daily    Plavix 75 Mg Tabs (Clopidogrel bisulfate) .Marland Kitchen... 1 tab once daily    Furosemide 20 Mg Tabs (Furosemide) .Marland Kitchen... Take one tablet by mouth daily.  Orders: TLB-Creatinine, Blood ([id number]) TLB-Potassium (K+) ([id number])  Echocardiogram:  1. Left ventricle: LVEF is approximately 30 to 35% with inferior      akinesis, posterior hypokinesis; inferoseptal hypokinesis. The      cavity size was normal. Wall thickness was normal.   2. Mitral valve: Mild prolapse, involving the anterior leaflet. Mild      regurgitation.   3. Right ventricle: Systolic function was moderately reduced.   4. Pulmonary arteries: PA peak  pressure: 47mm Hg (S). (04/06/2009)  Problem # 2:  DYSLIPIDEMIA (ICD-272.4) cont same, check LFTs for saftey monitoring His updated medication list for this problem includes:    Crestor 10 Mg Tabs (Rosuvastatin calcium) .Marland Kitchen... 1 tab at bedtime    HDL:40 (04/06/2009)  LDL:81 (04/06/2009)  Chol:130 (04/06/2009)  Trig:43 (04/06/2009)  Problem # 3:  COPD (ICD-496)  His updated medication list for this problem includes:    Ventolin Hfa 108 (90 Base) Mcg/act Aers (Albuterol sulfate) .Marland Kitchen... 1-2 puffs every 4-6 hours as needed    Spiriva Handihaler 18 Mcg Caps (Tiotropium bromide monohydrate) ..... Once a day per pulm  Stay on spiriva  Take tessalon as needed for cough  Pulmonary Functions Reviewed: O2 sat: 97 (10/01/2009)     Vaccines Reviewed: Pneumovax: Pneumovax (Medicare) (07/03/2009)   Flu Vax: Historical (04/04/2009)  Complete Medication  List: 1)  Aspirin Ec 325 Mg Tbec (Aspirin) .... Take one tablet by mouth daily 2)  Plavix 75 Mg Tabs (Clopidogrel bisulfate) .Marland Kitchen.. 1 tab once daily 3)  Crestor 10 Mg Tabs (Rosuvastatin calcium) .Marland Kitchen.. 1 tab at bedtime 4)  Ventolin Hfa 108 (90 Base) Mcg/act Aers (Albuterol sulfate) .Marland Kitchen.. 1-2 puffs every 4-6 hours as needed 5)  Vitamin B-12 100 Mcg Tabs (Cyanocobalamin) .Marland Kitchen.. 1 tab once daily 6)  Vitamin C 500 Mg Tabs (Ascorbic acid) .Marland Kitchen.. 1 tab once daily 7)  Vitamin D 1000 Unit Tabs (Cholecalciferol) .Marland Kitchen.. 1 tab once daily 8)  Vitamin E 400 Unit Caps (Vitamin e) .Marland Kitchen.. 1 cap once daily 9)  Spiriva Handihaler 18 Mcg Caps (Tiotropium bromide monohydrate) .... Once a day 10)  Furosemide 20 Mg Tabs (Furosemide) .... Take one tablet by mouth daily. 11)  Potassium Chloride Crys Cr 20 Meq Cr-tabs (Potassium chloride crys cr) .... Take one tablet by mouth daily 12)  Benzonatate 200 Mg Caps (Benzonatate) .Marland Kitchen.. 1 by mouth at bedtime as needed for cough  Other Orders: TLB-Hepatic/Liver Function Pnl ([id number])  Patient Instructions: 1)  it was good to see you  today. 2)  test(s) ordered today - your results will be posted on the phone tree for review in 48-72 hours from the time of test completion; call [phone] and enter your 9 digit MRN (listed above on this page, just below your name); if any changes need to be made or there are abnormal results, you will be contacted directly.  3)  no medication changes - keep doing what you are doing 4)  Please schedule a follow-up appointment in 4-6 months, sooner if problems.

## 2010-08-05 NOTE — Assessment & Plan Note (Signed)
Summary: rov/eval to discontinue plavix/lg   Visit Type:  rov Primary Provider:  Rene Paci, MD  CC:  edema/ankles...pt c/o being broke out in rash...family wants to eval to see if pt can come off plavix....pt denies any complaints today.  History of Present Illness: 77 yo WM with history of CAD, COPD, BPH and hyperlipidemia admitted to [facility] 04/06/09 with SOB and shoulder pain. He ruled in for MI with serial enzymes. Cardiac cath 04/08/09 with sub-total occlusion of distal Circumflex. Dr. Juanda Chance placed two overlapping drug eluting stents in the mid Circumflex.  He has been taking his Plavix, ASA and statin. He was not started on a beta blocker prior to discharge because of hypotension. He was discharged on Lisinopril and Lasix but both of these medications were stopped because of hypotension. He was admitted to the hospital 05/20/09 with respiratory distress felt to be secondary to COPD exacerbation as well as CHF exacerbation. We diuresed him and pulmonary also saw him and recommended antibiotics, steroids with taper as outpatient, nebulizers, spriva. His diuretics were stopped before discharge because of renal insufficiency. I started him back on a low dose of Lasix (20 mg). He has been on Spiriva per Dr. Vassie Loll. He has been seen by both Dr. Vassie Loll from Pulmonary and Dr. Felicity Coyer.    He is doing well today. He reports that his breathing is much better.  He develops some lower extremity edema over the course of the day that resolves at night.  He has been taking all of his medications. No chest pain, near syncope or syncope. Echocardiogram this spring with continued LV dysfunction with an EF of 30-35%, moderate MR. He has been itching since starting Plavix and wishes to stop the Plavix. It has been a year since his overlapping DES were placed.   Current Medications (verified): 1)  Aspirin Ec 325 Mg Tbec (Aspirin) .... Take One Tablet By Mouth Daily 2)  Plavix 75 Mg Tabs (Clopidogrel Bisulfate) .Marland Kitchen..  1 Tab Once Daily 3)  Crestor 10 Mg Tabs (Rosuvastatin Calcium) .Marland Kitchen.. 1 Tab At Bedtime 4)  Ventolin Hfa 108 (90 Base) Mcg/act  Aers (Albuterol Sulfate) .Marland Kitchen.. 1-2 Puffs Every 4-6 Hours As Needed 5)  Vitamin B-12 100 Mcg Tabs (Cyanocobalamin) .Marland Kitchen.. 1 Tab Once Daily 6)  Vitamin C 500 Mg Tabs (Ascorbic Acid) .Marland Kitchen.. 1 Tab Once Daily 7)  Vitamin D 2000 Unit Tabs (Cholecalciferol) .... Take 1 Tablet By Mouth Once A Day 8)  Vitamin E 400 Unit Caps (Vitamin E) .Marland Kitchen.. 1 Cap Once Daily 9)  Spiriva Handihaler 18 Mcg Caps (Tiotropium Bromide Monohydrate) .... Every Other Day 10)  Furosemide 20 Mg Tabs (Furosemide) .... Take One Tablet By Mouth Daily. 11)  Potassium Chloride Crys Cr 20 Meq Cr-Tabs (Potassium Chloride Crys Cr) .... Take One Tablet By Mouth Daily 12)  Benzonatate 200 Mg Caps (Benzonatate) .Marland Kitchen.. 1 By Mouth At Bedtime As Needed For Cough 13)  Aerochamber Z-Stat Plus  Misc (Spacer/aero-Holding Chambers) .... As Directed With Mdi  Allergies (verified): No Known Drug Allergies  Past History:  Past Medical History: Reviewed history from 01/28/2010 and no changes required. NonSTEMI  10/10  CAD, Native vessel s/p overlapping Xience DES stents mid Circumflex 04/08/09. CARDIOMYOPATHY, ISCHEMIC (ICD-414.8) DYSLIPIDEMIA (ICD-272.4) COPD (ICD-496) with exacerbation 11/10  BENIGN PROSTATIC HYPERTROPHY, HX OF  ADENOCARCINOMA, COLON, HX OF    MD roster: cards- [facility] pulm- Vassie Loll GI- Medoff onc - Ennever  Social History: Reviewed history from 07/03/2009 and no changes required. 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  No  alcohol.   Remote use of tobacco, none recently He had one daughter die at 38.  Review of Systems  The patient denies fatigue, malaise, fever, weight gain/loss, vision loss, decreased hearing, hoarseness, chest pain, palpitations,  shortness of breath, prolonged cough, wheezing, sleep apnea, coughing up blood, abdominal pain, blood in stool, nausea, vomiting, diarrhea, heartburn, incontinence, blood in urine, muscle weakness, joint pain, leg swelling, rash, skin lesions, headache, fainting, dizziness, depression, anxiety, enlarged lymph nodes, easy bruising or bleeding, and environmental allergies.         Itching.   Vital Signs:  Patient profile:   77 year old male Height:      64 inches Weight:      162.4 pounds BMI:     27.98 Pulse rate:   72 / minute Pulse rhythm:   irregular BP sitting:   102 / 70  (left arm) Cuff size:   large  Vitals Entered By: Danielle Rankin, CMA (May 05, 2010 9:43 AM)  Physical Exam  General:  General: Well developed, well nourished, NAD Musculoskeletal: Muscle strength 5/5 all ext Psychiatric:Flat affect Neck: No JVD, no carotid bruits, no thyromegaly, no lymphadenopathy. Lungs:Clear bilaterally, no wheezes, rhonci, crackles.  CV: RRR, with systolic murmur at apex,  No gallops rubs Abdomen: soft, NT, ND, BS present Extremities: Trace to 1+ bilateral lower extremity  edema, pulses 1-2+.   Impression & Recommendations:  Problem # 1:  CAD, NATIVE VESSEL (ICD-414.01) Stable. It has been 13 months since drug eluting stents were placed. He wishes to stop Plavix. I think that should be safe at this time.  Will stop Plavix. Continue ASA325 mg by mouth Qdaily. He is not on a beta blocker secondary to his lung disease.  He is on a statin.   The following medications were removed from the medication list:    Plavix 75 Mg Tabs (Clopidogrel bisulfate) .Marland Kitchen... 1 tab once daily His updated medication list for this problem includes:    Aspirin Ec 325 Mg Tbec (Aspirin) .Marland Kitchen... Take one tablet by mouth daily  Problem # 2:  SYSTOLIC HEART FAILURE, ACUTE ON CHRONIC (ICD-428.23) Volume ok. Continue low dose diuretic. No beta blocker because of COPD. He is not on an Ace-inhibitor secondary to renal  insufficiency. He has not wanted to consider an ICD.   The following medications were removed from the medication list:    Plavix 75 Mg Tabs (Clopidogrel bisulfate) .Marland Kitchen... 1 tab once daily His updated medication list for this problem includes:    Aspirin Ec 325 Mg Tbec (Aspirin) .Marland Kitchen... Take one tablet by mouth daily    Furosemide 20 Mg Tabs (Furosemide) .Marland Kitchen... Take one tablet by mouth daily.  Patient Instructions: 1)  Your physician recommends that you schedule a follow-up appointment in: 6 months 2)  Your physician has recommended you make the following change in your medication: STOP PLAVIX.  Appended Document: rov/eval to discontinue plavix/lg    Clinical Lists Changes  Medications: Changed medication from CRESTOR 10 MG TABS (ROSUVASTATIN CALCIUM) 1 tab at bedtime to SIMVASTATIN 40 MG TABS (SIMVASTATIN) Take one tablet by mouth daily at bedtime - Signed Rx of SIMVASTATIN 40 MG TABS (SIMVASTATIN) Take one tablet by mouth daily at bedtime;  #30 x 8;  Signed;  Entered by: Whitney Maeola Sarah RN;  Authorized by: Verne Carrow, MD;  Method used: Electronically to  [facility] [address] [phone]*, [address] [place], [place]  [address], Ph: [phone], Fax: [phone]    Prescriptions: SIMVASTATIN 40 MG TABS (SIMVASTATIN) Take one tablet by mouth daily at bedtime  #30 x 8   Entered by:   Whitney Maeola Sarah RN   Authorized by:   Verne Carrow, MD   Signed by:   Ellender Hose RN on 05/13/2010   Method used:   Electronically to        [facility]. [phone]* (retail)       [address] [place], [place]  [address]       Ph: [phone]       Fax: [phone]   RxID:   [id number]

## 2010-08-05 NOTE — Letter (Signed)
Summary: Cardiology Admission Orders  Cardiology Admission Orders   Imported By: Kassie Mends 07/23/2009 13:55:30  _____________________________________________________________________  External Attachment:    Type:   Image     Comment:   External Document

## 2010-08-05 NOTE — Assessment & Plan Note (Signed)
Summary: NP follow up - COPD   Primary Provider/Referring Provider:  Rene Paci, MD  CC:  4 month follow up - states breahting is doing well and no complaints today.  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 Adm 11/15-18 for CHF , lt  effusion. BNP 763 Diuresed, placed on Spiriva & short burst of steroids.at discharge.  He was hyponatremic with a sodium of 122 on  admission, but this actually improved to 129 by discharge.  The TSH was  within normal limits at 1.6.   CT angio neg PE 9/10. BL pleural plaques noted. CT chest 06/07/09   1. Stable high density mediastinal and hilar lymph nodes 2.  Resolution of right pleural effusion 3.  Stable to increase in volume of left effusion 4.  New nonspecific 4 mm nodule in the right base.  June 14, 2009 9:56 AM  c/o dry cough, does not move around much, sleeps all the time. Is able to walk in the store & around the house spirometry - ratio nml, FEV1 67% Reviewed nocturnal oximetry >>desaturation for a few minutes only. No desatn on exertion today. Na has improved to 135.  August 13, 2009 2:17 PM  c/o dizziness this am when he wakes up, cough better . No wheezing,pedal edema, chest pain  December 12, 2009--Presents for 4 month follow up - states breahting is doing well, no complaints today. Wife and daughter with him today. He has been dong very well. His breathing has been much better since starting on Spiriva. They are very pleased. They are leaving for [place] in few weeks to stay with son for 3 weeks. Denies chest pain, dyspnea, orthopnea, hemoptysis, fever, n/v/d, edema, headache.    Medications Prior to Update: 1)  Aspirin Ec 325 Mg Tbec (Aspirin) .... Take One Tablet By Mouth Daily 2)  Plavix 75 Mg Tabs (Clopidogrel Bisulfate) .Marland Kitchen.. 1 Tab Once Daily 3)  Crestor 10 Mg Tabs (Rosuvastatin Calcium) .Marland Kitchen.. 1 Tab At Bedtime 4)  Ventolin Hfa 108  (90 Base) Mcg/act  Aers (Albuterol Sulfate) .Marland Kitchen.. 1-2 Puffs Every 4-6 Hours As Needed 5)  Vitamin B-12 100 Mcg Tabs (Cyanocobalamin) .Marland Kitchen.. 1 Tab Once Daily 6)  Vitamin C 500 Mg Tabs (Ascorbic Acid) .Marland Kitchen.. 1 Tab Once Daily 7)  Vitamin D 1000 Unit Tabs (Cholecalciferol) .Marland Kitchen.. 1 Tab Once Daily 8)  Vitamin E 400 Unit Caps (Vitamin E) .Marland Kitchen.. 1 Cap Once Daily 9)  Spiriva Handihaler 18 Mcg Caps (Tiotropium Bromide Monohydrate) .... Once A Day 10)  Furosemide 20 Mg Tabs (Furosemide) .... Take One Tablet By Mouth Daily. 11)  Potassium Chloride Crys Cr 20 Meq Cr-Tabs (Potassium Chloride Crys Cr) .... Take One Tablet By Mouth Daily 12)  Benzonatate 200 Mg Caps (Benzonatate) .Marland Kitchen.. 1 By Mouth At Bedtime As Needed For Cough  Current Medications (verified): 1)  Aspirin Ec 325 Mg Tbec (Aspirin) .... Take One Tablet By Mouth Daily 2)  Plavix 75 Mg Tabs (Clopidogrel Bisulfate) .Marland Kitchen.. 1 Tab Once Daily 3)  Crestor 10 Mg Tabs (Rosuvastatin Calcium) .Marland Kitchen.. 1 Tab At Bedtime 4)  Ventolin Hfa 108 (90 Base) Mcg/act  Aers (Albuterol Sulfate) .Marland Kitchen.. 1-2 Puffs Every 4-6 Hours As Needed 5)  Vitamin B-12 100 Mcg Tabs (Cyanocobalamin) .Marland Kitchen.. 1 Tab Once Daily 6)  Vitamin C 500 Mg Tabs (Ascorbic Acid) .Marland Kitchen.. 1 Tab Once Daily 7)  Vitamin D 2000 Unit Tabs (Cholecalciferol) .... Take 1 Tablet By Mouth  Once A Day 8)  Vitamin E 400 Unit Caps (Vitamin E) .Marland Kitchen.. 1 Cap Once Daily 9)  Spiriva Handihaler 18 Mcg Caps (Tiotropium Bromide Monohydrate) .... Once A Day 10)  Furosemide 20 Mg Tabs (Furosemide) .... Take One Tablet By Mouth Daily. 11)  Potassium Chloride Crys Cr 20 Meq Cr-Tabs (Potassium Chloride Crys Cr) .... Take One Tablet By Mouth Daily 12)  Benzonatate 200 Mg Caps (Benzonatate) .Marland Kitchen.. 1 By Mouth At Bedtime As Needed For Cough  Allergies (verified): No Known Drug Allergies  Past History:  Past Medical History: Last updated: 10/01/2009 NonSTEMI  10/10  CAD, Native vessel s/p overlapping Xience DES stents mid Circumflex  04/08/09. CARDIOMYOPATHY, ISCHEMIC (ICD-414.8) DYSLIPIDEMIA (ICD-272.4) COPD (ICD-496) with exacerbation 11/10 BENIGN PROSTATIC HYPERTROPHY, HX OF  ADENOCARCINOMA, COLON, HX OF    MD roster: cards- [facility] pulm- Vassie Loll GI- Medoff onc Myna Hidalgo  Past Surgical History: Last updated: 07/03/2009 Sigmoid colectomy.  SURGEON:  Velora Heckler, MD, FACS  Tonsillectomy (childhood) Appendectomy (childhood)    PTCA/stent  Family History: Last updated: 04/29/2009 Noncontributory.  Mother deceased ? cause Father deceaesed at age 8, ? 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  No  alcohol.   Remote use of tobacco, none recently He had one daughter die at 38.  Review of Systems      See HPI  Vital Signs:  Patient profile:   77 year old male Height:      64 inches Weight:      159 pounds BMI:     27.39 O2 Sat:      94 % on Room air Temp:     97.6 degrees F oral Pulse rate:   66 / minute BP sitting:   104 / 66  (left arm) Cuff size:   regular  Vitals Entered By: Boone Master CNA/MA (December 12, 2009 11:05 AM)  O2 Flow:  Room air CC: 4 month follow up - states breahting is doing well, no complaints today Comments Medications reviewed with patient Daytime contact number verified with patient. Boone Master CNA/MA  December 12, 2009 11:07 AM    Physical Exam  Additional Exam:  Gen. Pleasant, well-nourished, in no distress, normal affect, hard of hearing ENT - no lesions, no post nasal drip Neck: No JVD, no thyromegaly, no carotid bruits Lungs: no use of accessory muscles, no dullness to percussion, decreased lt base without rales or rhonchi  Cardiovascular: Rhythm regular, heart sounds  normal, no murmurs or gallops, 1+ peripheral edema Musculoskeletal: No deformities, no cyanosis or  clubbing      Impression & Recommendations:  Problem # 1:  COPD (ICD-496) Compensated on Spiriva.  follow up Dr. Vassie Loll in 4 months Pneumovax is UTD.   Medications Added to Medication List This Visit: 1)  Vitamin D 2000 Unit Tabs (Cholecalciferol) .... Take 1 tablet by mouth once a day  Complete Medication List: 1)  Aspirin Ec 325 Mg Tbec (Aspirin) .... Take one tablet by mouth daily 2)  Plavix 75 Mg Tabs (Clopidogrel bisulfate) .Marland Kitchen.. 1 tab once daily 3)  Crestor 10 Mg Tabs (Rosuvastatin calcium) .Marland Kitchen.. 1 tab at bedtime 4)  Ventolin Hfa 108 (90 Base) Mcg/act Aers (Albuterol sulfate) .Marland Kitchen.. 1-2 puffs every 4-6 hours as needed 5)  Vitamin B-12 100 Mcg Tabs (Cyanocobalamin) .Marland Kitchen.. 1 tab  once daily 6)  Vitamin C 500 Mg Tabs (Ascorbic acid) .Marland Kitchen.. 1 tab once daily 7)  Vitamin D 2000 Unit Tabs (Cholecalciferol) .... Take 1 tablet by mouth once a day 8)  Vitamin E 400 Unit Caps (Vitamin e) .Marland Kitchen.. 1 cap once daily 9)  Spiriva Handihaler 18 Mcg Caps (Tiotropium bromide monohydrate) .... Once a day 10)  Furosemide 20 Mg Tabs (Furosemide) .... Take one tablet by mouth daily. 11)  Potassium Chloride Crys Cr 20 Meq Cr-tabs (Potassium chloride crys cr) .... Take one tablet by mouth daily 12)  Benzonatate 200 Mg Caps (Benzonatate) .Marland Kitchen.. 1 by mouth at bedtime as needed for cough  Other Orders: Est. Patient Level II (29562)  Patient Instructions: 1)  Continue on Spiriva daily  2)  follow up 4 months Dr. Vassie Loll  3)  Call sooner if needed.

## 2010-08-05 NOTE — Letter (Signed)
Summary: [facility]  [facility]   Imported By: Marylou Mccoy 07/24/2009 18:46:52  _____________________________________________________________________  External Attachment:    Type:   Image     Comment:   External Document

## 2010-08-05 NOTE — Progress Notes (Signed)
Summary: PLEASE CONTACT PCP/PHAMRACY NOTIFIED  Phone Note Refill Request Call back at Home Phone [phone] Message from:  Patient on [facility] [address]  Refills Requested: Medication #1:  SPIRIVA HANDIHALER 18 MCG CAPS once a day pt wants to pick up by 11am  Initial call taken by: Omer Jack,  January 17, 2010 8:57 AM    Prescriptions: SPIRIVA HANDIHALER 18 MCG CAPS (TIOTROPIUM BROMIDE MONOHYDRATE) once a day  #0 x 0   Entered by:   Danielle Rankin, CMA   Authorized by:   Verne Carrow, MD   Signed by:   Danielle Rankin, CMA on 01/17/2010   Method used:   Telephoned to ...       [facility] [address] [phone]* (retail)       [address] [place], [place]  [address]       Ph: [phone]       Fax: [phone]   RxID:   [id number]

## 2010-08-05 NOTE — Assessment & Plan Note (Signed)
Summary: 4-6 mth fu---stc   Vital Signs:  Patient profile:   77 year old male Height:      64 inches (162.56 cm) Weight:      161.8 pounds (73.55 kg) O2 Sat:      96 % on Room air Temp:     97.9 degrees F (36.61 degrees C) oral Pulse rate:   72 / minute BP sitting:   100 / 58  (left arm) Cuff size:   regular  Vitals Entered By: Orlan Leavens RMA (May 20, 2010 10:11 AM)  O2 Flow:  Room air CC: 6 month follow-up Is Patient Diabetic? No Pain Assessment Patient in pain? no        Primary Care Provider:  Rene Paci, MD  CC:  6 month follow-up.  History of Present Illness: here for 3-6 mo followup - wife and dtr asst with hx as needed   1) COPD hx - feels breathing is baseline today much improved since hosp fall 2010 -  attributes Spiriva to his improved symptoms - no need for rescue MDI use chronic mild cough; no SOB, DOE or PND - cont mild wheeze when bending forward or prone- reports compliance with ongoing medical treatment and no changes in medication dose or frequency. denies adverse side effects related to current therapy.   2) NSTEMI 04/2009 with CAD and Isch CM (chronic HF)- denies cp, pnd; improvement in chronic peiph edema - reports compliance with ongoing medical treatment and no changes in medication dose or frequency. denies adverse side effects related to current therapy.  3) hx colon ca, s/p surg resection 2008 no bowel changes or diarrhea; no weight changes or hematocezia released by onc this year 2011-   4) dyslipidemia- reports compliance with ongoing medical treatment and no changes in medication dose or frequency. denies adverse side effects related to current therapy. no muscle or GI problems   Clinical Review Panels:  Lipid Management   Cholesterol:  130 (04/06/2009)   LDL (bad choesterol):  81 (04/06/2009)   HDL (good cholesterol):  40 (04/06/2009)   Triglycerides:  43 (04/06/2009)  CBC   WBC:  12.4 (05/23/2009)   RBC:  3.88  (05/23/2009)   Hgb:  12.4 (05/23/2009)   Hct:  36.6 (05/23/2009)   Platelets:  144 (05/23/2009)   MCV  94.2 (05/23/2009)   RDW  14.5 (05/23/2009)  Complete Metabolic Panel   Glucose:  86 (07/10/2009)   Sodium:  140 (07/10/2009)   Potassium:  4.6 (10/01/2009)   Chloride:  105 (07/10/2009)   CO2:  28 (07/10/2009)   BUN:  24 (07/10/2009)   Creatinine:  1.5 (10/01/2009)   Albumin:  3.3 (10/01/2009)   Total Protein:  6.5 (10/01/2009)   Calcium:  9.2 (07/10/2009)   Total Bili:  0.8 (10/01/2009)   Alk Phos:  83 (10/01/2009)   SGPT (ALT):  15 (10/01/2009)   SGOT (AST):  22 (10/01/2009)   Current Medications (verified): 1)  Aspirin Ec 325 Mg Tbec (Aspirin) .... Take One Tablet By Mouth Daily 2)  Simvastatin 40 Mg Tabs (Simvastatin) .... Take One Tablet By Mouth Daily At Bedtime 3)  Ventolin Hfa 108 (90 Base) Mcg/act  Aers (Albuterol Sulfate) .Marland Kitchen.. 1-2 Puffs Every 4-6 Hours As Needed 4)  Vitamin B-12 100 Mcg Tabs (Cyanocobalamin) .Marland Kitchen.. 1 Tab Once Daily 5)  Vitamin C 500 Mg Tabs (Ascorbic Acid) .Marland Kitchen.. 1 Tab Once Daily 6)  Vitamin D 2000 Unit Tabs (Cholecalciferol) .... Take 1 Tablet By Mouth Once A Day 7)  Vitamin E 400 Unit Caps (Vitamin E) .Marland Kitchen.. 1 Cap Once Daily 8)  Spiriva Handihaler 18 Mcg Caps (Tiotropium Bromide Monohydrate) .... Every Other Day 9)  Furosemide 20 Mg Tabs (Furosemide) .... Take One Tablet By Mouth Daily. 10)  Potassium Chloride Crys Cr 20 Meq Cr-Tabs (Potassium Chloride Crys Cr) .... Take One Tablet By Mouth Daily 11)  Benzonatate 200 Mg Caps (Benzonatate) .Marland Kitchen.. 1 By Mouth At Bedtime As Needed For Cough 12)  Aerochamber Z-Stat Plus  Misc (Spacer/aero-Holding Chambers) .... As Directed With Mdi  Allergies (verified): No Known Drug Allergies  Past History:  Past Medical History: NonSTEMI  10/10  CAD, Native vessel s/p overlapping Xience DES stents mid Circumflex 04/08/09. CARDIOMYOPATHY, ISCHEMIC (ICD-414.8) DYSLIPIDEMIA (ICD-272.4) COPD  with exacerbation 11/10    BENIGN PROSTATIC HYPERTROPHY, HX OF  ADENOCARCINOMA, COLON, HX OF    MD roster: cards- McAlhany pulm- Vassie Loll GI- Medoff onc - Ennever  Review of Systems  The patient denies chest pain, syncope, and headaches.         c/o dry skin and itch x 1 week on back  Physical Exam  General:  alert, well-developed, well-nourished, and cooperative to examination.   wife and dtr at side Lungs:  normal respiratory effort, no intercostal retractions or use of accessory muscles; decreased breath sounds at bases bilaterally - but no crackles and no wheezes.    Heart:  normal rate, regular rhythm, no murmur, and no rub. BLE with trace chronic edema.  Skin:  dry skin with mild excema on back/trunk   Impression & Recommendations:  Problem # 1:  DYSLIPIDEMIA (ICD-272.4)  His updated medication list for this problem includes:    Simvastatin 40 Mg Tabs (Simvastatin) .Marland Kitchen... Take one tablet by mouth daily at bedtime  Orders: TLB-Lipid Panel (80061-LIPID)  Labs Reviewed: SGOT: 22 (10/01/2009)   SGPT: 15 (10/01/2009)   HDL:40 (04/06/2009)  LDL:81 (04/06/2009)  Chol:130 (04/06/2009)  Trig:43 (04/06/2009)  Problem # 2:  ECZEMA (ICD-692.9)  His updated medication list for this problem includes:    Triamcinolone Acetonide 0.1 % Lotn (Triamcinolone acetonide) .Marland Kitchen... Apply to affected skin itch three times a day as needed  Orders: Prescription Created Electronically [phone])  Problem # 3:  COPD (ICD-496)  His updated medication list for this problem includes:    Ventolin Hfa 108 (90 Base) Mcg/act Aers (Albuterol sulfate) .Marland Kitchen... 1-2 puffs every 4-6 hours as needed    Spiriva Handihaler 18 Mcg Caps (Tiotropium bromide monohydrate) ..... Every other day  reviewed albuterol DMI with spacer for rescue ONLY OK to take spiriva every other day per pulm will also add H2B for "pseudowheeze" symptoms related to prone position to treat poss VCD/reflux  Pulmonary Functions Reviewed: O2 sat: 96 (05/20/2010)      Vaccines Reviewed: Pneumovax: Pneumovax (Medicare) (07/03/2009)   Flu Vax: Historical given @ [facility] (L) deltoid Lot # [id number] (04/10/2010)  Complete Medication List: 1)  Aspirin Ec 325 Mg Tbec (Aspirin) .... Take one tablet by mouth daily 2)  Simvastatin 40 Mg Tabs (Simvastatin) .... Take one tablet by mouth daily at bedtime 3)  Ventolin Hfa 108 (90 Base) Mcg/act Aers (Albuterol sulfate) .Marland Kitchen.. 1-2 puffs every 4-6 hours as needed 4)  Vitamin B-12 100 Mcg Tabs (Cyanocobalamin) .Marland Kitchen.. 1 tab once daily 5)  Vitamin C 500 Mg Tabs (Ascorbic acid) .Marland Kitchen.. 1 tab once daily 6)  Vitamin D 2000 Unit Tabs (Cholecalciferol) .... Take 1 tablet by mouth once a day 7)  Vitamin E 400 Unit Caps (Vitamin e) .Marland KitchenMarland KitchenMarland Kitchen  1 cap once daily 8)  Spiriva Handihaler 18 Mcg Caps (Tiotropium bromide monohydrate) .... Every other day 9)  Furosemide 20 Mg Tabs (Furosemide) .... Take one tablet by mouth daily. 10)  Potassium Chloride Crys Cr 20 Meq Cr-tabs (Potassium chloride crys cr) .... Take one tablet by mouth daily 11)  Benzonatate 200 Mg Caps (Benzonatate) .Marland Kitchen.. 1 by mouth at bedtime as needed for cough 12)  Aerochamber Z-stat Plus Misc (Spacer/aero-holding chambers) .... As directed with mdi 13)  Ranitidine Hcl 150 Mg Tabs (Ranitidine hcl) .Marland Kitchen.. 1 by mouth once daily 14)  Triamcinolone Acetonide 0.1 % Lotn (Triamcinolone acetonide) .... Apply to affected skin itch three times a day as needed  Patient Instructions: 1)  it was good to see you today. 2)  start ranitadine for reflux to see if this helps "wheeze" symptoms  3)  also steroid lotion for itchy skin as discussed - keep mosturized after baths/shower and each AM for dry skin 4)  no other medication changes -  5)  Please schedule a follow-up appointment in 4-6 months, sooner if problems.   Prescriptions: TRIAMCINOLONE ACETONIDE 0.1 % LOTN (TRIAMCINOLONE ACETONIDE) apply to affected skin itch three times a day as needed  #1 x 1   Entered and Authorized  by:   Newt Lukes MD   Signed by:   Newt Lukes MD on 05/20/2010   Method used:   Electronically to        [facility] [address] [phone]* (retail)       [address] [place], [place]  [address]       Ph: [phone]       Fax: [phone]   RxID:   [id number] RANITIDINE HCL 150 MG TABS (RANITIDINE HCL) 1 by mouth once daily  #30 x 1   Entered and Authorized by:   Newt Lukes MD   Signed by:   Newt Lukes MD on 05/20/2010   Method used:   Electronically to        [facility] [address] [phone]* (retail)       [address] [place], [place]  [address]       Ph: [phone]       Fax: [phone]   RxID:   [id number]    Orders Added: 1)  TLB-Lipid Panel [80061-LIPID] 2)  Est. Patient Level IV [16010] 3)  Prescription Created Electronically [phone]

## 2010-08-05 NOTE — Progress Notes (Signed)
Summary: refill med  Phone Note Refill Request Call back at Home Phone [phone] Message from:  Patient on August 16, 2009 2:38 PM  Refills Requested: Medication #1:  POTASSIUM CHLORIDE CRYS CR 20 MEQ CR-TABS Take one tablet by mouth daily [facility]    Method Requested: Fax to Local Pharmacy Initial call taken by: Lorne Skeens,  August 16, 2009 2:39 PM  Follow-up for Phone Call        Rx faxed to pharmacy Follow-up by: Oswald Hillock,  August 16, 2009 3:17 PM    Prescriptions: POTASSIUM CHLORIDE CRYS CR 20 MEQ CR-TABS (POTASSIUM CHLORIDE CRYS CR) Take one tablet by mouth daily  #90 x 1   Entered by:   Oswald Hillock   Authorized by:   Verne Carrow, MD   Signed by:   Oswald Hillock on 08/16/2009   Method used:   Electronically to        [facility]. [phone]* (retail)       [address] [place], [place]  [address]       Ph: [phone]       Fax: [phone]   RxID:   [id number]

## 2010-08-05 NOTE — Assessment & Plan Note (Signed)
Summary: 2 months w/cxr/apc   Primary Provider/Referring Provider:  Rene Paci, MD  CC:  2 month follow up with CXR.  states breathing is doing well overall-No complaints. .  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 Adm 11/15-18 for CHF , lt  effusion. BNP 763 Diuresed, placed on Spiriva & short burst of steroids.at discharge.  He was hyponatremic with a sodium of 122 on  admission, but this actually improved to 129 by discharge.  The TSH was  within normal limits at 1.6.   CT angio neg PE 9/10. BL pleural plaques noted. CT chest 06/07/09   1. Stable high density mediastinal and hilar lymph nodes 2.  Resolution of right pleural effusion 3.  Stable to increase in volume of left effusion 4.  New nonspecific 4 mm nodule in the right base.  June 14, 2009 9:56 AM  c/o dry cough, does not move around much, sleeps all the time. Is able to walk in the store & around the house spirometry - ratio nml, FEV1 67% Reviewed nocturnal oximetry >>desaturation for a few minutes only. No desatn on exertion today. Na has improved to 135.  August 13, 2009 2:17 PM  c/o dizziness this am when he wakes up, cough better . No wheezing,pedal edema, chest pain   Current Medications (verified): 1)  Aspirin Ec 325 Mg Tbec (Aspirin) .... Take One Tablet By Mouth Daily 2)  Plavix 75 Mg Tabs (Clopidogrel Bisulfate) .Marland Kitchen.. 1 Tab Once Daily 3)  Crestor 10 Mg Tabs (Rosuvastatin Calcium) .Marland Kitchen.. 1 Tab At Bedtime 4)  Ventolin Hfa 108 (90 Base) Mcg/act  Aers (Albuterol Sulfate) .Marland Kitchen.. 1-2 Puffs Every 4-6 Hours As Needed 5)  Vitamin B-12 100 Mcg Tabs (Cyanocobalamin) .Marland Kitchen.. 1 Tab Once Daily 6)  Vitamin C 500 Mg Tabs (Ascorbic Acid) .Marland Kitchen.. 1 Tab Once Daily 7)  Vitamin D 1000 Unit Tabs (Cholecalciferol) .Marland Kitchen.. 1 Tab Once Daily 8)  Vitamin E 400 Unit Caps (Vitamin E) .Marland Kitchen.. 1 Cap Once Daily 9)  Spiriva Handihaler 18 Mcg Caps  (Tiotropium Bromide Monohydrate) .... Once A Day 10)  Furosemide 20 Mg Tabs (Furosemide) .... Take One Tablet By Mouth Daily. 11)  Potassium Chloride Crys Cr 20 Meq Cr-Tabs (Potassium Chloride Crys Cr) .... Take One Tablet By Mouth Daily 12)  Benzonatate 200 Mg Caps (Benzonatate) .Marland Kitchen.. 1 By Mouth At Bedtime As Needed For Cough  Allergies (verified): No Known Drug Allergies  Past History:  Past Medical History: Last updated: 07/10/2009 NonSTEMI  10/10  CAD, Native vessel s/p overlapping Xience DES stents mid Circumflex 04/08/09. 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  No  alcohol.   Remote use of tobacco, none recently He had one daughter die at 53.  Review of Systems  The patient denies anorexia, fever, weight loss, weight gain, vision loss, decreased hearing, hoarseness, chest pain, syncope, dyspnea on exertion, peripheral edema, prolonged cough, headaches, hemoptysis, abdominal pain, melena, hematochezia, severe indigestion/heartburn, hematuria, muscle weakness, difficulty walking, depression, unusual weight change, and abnormal bleeding.    Vital Signs:  Patient profile:  77 year old male Height:      64 inches Weight:      154 pounds BMI:     26.53 O2 Sat:      98 % on Room air Temp:     97.6 degrees F oral Pulse rate:   72 / minute BP sitting:   104 / 56  (right arm) Cuff size:   regular  Vitals Entered By: Gweneth Dimitri RN (August 13, 2009 1:55 PM)  O2 Flow:  Room air CC: 2 month follow up with CXR.  states breathing is  doing well overall-No complaints.  Comments Medications reviewed with patient Daytime contact number verified with patient. Gweneth Dimitri RN  August 13, 2009 1:55 PM    Physical Exam  Additional Exam:  Gen. Pleasant, well-nourished, in no distress, normal affect, hard of hearing ENT - no lesions, no post nasal drip Neck: No JVD, no thyromegaly, no carotid bruits Lungs: no use of accessory muscles, no dullness to percussion, decreased lt base without rales or rhonchi  Cardiovascular: Rhythm regular, heart sounds  normal, no murmurs or gallops, 1+ peripheral edema Musculoskeletal: No deformities, no cyanosis or clubbing      CXR  Procedure date:  08/13/2009  Findings:      IMPRESSION: Small left pleural effusion.  COPD.  Impression & Recommendations:  Problem # 1:  COPD (ICD-496) Stay on spiriva  Take tessalon as needed for cough  Problem # 2:  SYSTOLIC HEART FAILURE, ACUTE ON CHRONIC (ICD-428.23)  His updated medication list for this problem includes:    Aspirin Ec 325 Mg Tbec (Aspirin) .Marland Kitchen... Take one tablet by mouth daily    Plavix 75 Mg Tabs (Clopidogrel bisulfate) .Marland Kitchen... 1 tab once daily    Furosemide 20 Mg Tabs (Furosemide) .Marland Kitchen... Take one tablet by mouth daily.  Orders: Est. Patient Level III ([address])  Problem # 3:  PLEURAL EFFUSION, LEFT (ICD-511.9) Assessment: Unchanged  Orders: Est. Patient Level III ([address])  Problem # 4:  HYPERSOMNIA, PERSISTENT (ICD-307.44)  multifactorial - age, med conditions, meds, Na corrected No obvious sleep disordered breathing on hisotry  Orders: Est. Patient Level III (09811)  Other Orders: T-2 View CXR (71020TC)  Patient Instructions: 1)  Copy sent to: 2)  Please schedule a follow-up appointment in 4 months. 3)  Stay on spiriva  4)  Take tessalon as needed for cough

## 2010-08-05 NOTE — Miscellaneous (Signed)
Summary: Flu Vaccination/[facility]  [facility]   Imported By: Sherian Rein 04/14/2010 14:27:47  _____________________________________________________________________  External Attachment:    Type:   Image     Comment:   External Document

## 2010-08-05 NOTE — Letter (Signed)
Summary: [facility]  [facility]   Imported By: Kassie Mends 07/09/2009 08:08:53  _____________________________________________________________________  External Attachment:    Type:   Image     Comment:   External Document

## 2010-08-05 NOTE — Progress Notes (Signed)
Summary: prescript  Phone Note Call from Patient   Caller: Spouse Call For: alva Summary of Call: need spiriva capules without inhaler [facility] Initial call taken by: Rickard Patience,  July 29, 2009 10:05 AM  Follow-up for Phone Call        called, spoke with pt's wife.  Per pt's wife, she was told by the hospital that the inhaler with the spiriva capsules is what costs so much not the capsules.  She is wondering if she can get the capsules without the inhaler.  Informed her that the inhaler automatically comes with the capsules and this is the only way the rx is supplied.  She was not happy with this answer, so I called [facility] (the pts pharm) to verify this.  Spoke with Gary-pharmacist, and he verified this stating that a patient cannot get the capsules without the inhaler.  I called the pt's wife back and informed her of this.  She verbalized understanding. Follow-up by: Gweneth Dimitri RN,  July 29, 2009 10:43 AM

## 2010-08-05 NOTE — Assessment & Plan Note (Signed)
Summary: 4 months/apc   Visit Type:  Follow-up Primary Provider/Referring Provider:  Rene Paci, MD  CC:  Pt here for follow up. Pt c/o wheezing and dry "hacking" cough.  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 Adm 11/15-18 for CHF , lt  effusion. BNP 763 Diuresed, placed on Spiriva & short burst of steroids.at discharge.  He was hyponatremic with a sodium of 122 on  admission, but this actually improved to 129 by discharge. TSH wnl.   CT angio neg PE 9/10. BL pleural plaques noted. CT chest 06/07/09   1. Stable high density mediastinal and hilar lymph nodes 2.  Resolution of right pleural effusion 3.  Stable to increase in volume of left effusion 4.  New nonspecific 4 mm nodule in the right base.  June 14, 2009 9:56 AM  c/o dry cough, does not move around much, sleeps all the time. Is able to walk in the store & around the house spirometry - ratio nml, FEV1 67% Reviewed nocturnal oximetry >>desaturation for a few minutes only. No desatn on exertion today. Na has improved to 135.  April 14, 2010  FU - accompanied by wife & daughter. He denies new complaints. Can we use spiriva every other day ? -too expensive.  Denies chest pain, dyspnea, orthopnea, hemoptysis, fever, n/v/d, edema, headache.  April 14, 2010 3:03 PM  spiriva too expensive,   Preventive Screening-Counseling & Management  Alcohol-Tobacco     Smoking Status: quit  Current Medications (verified): 1)  Aspirin Ec 325 Mg Tbec (Aspirin) .... Take One Tablet By Mouth Daily 2)  Plavix 75 Mg Tabs (Clopidogrel Bisulfate) .Marland Kitchen.. 1 Tab Once Daily 3)  Crestor 10 Mg Tabs (Rosuvastatin Calcium) .Marland Kitchen.. 1 Tab At Bedtime 4)  Ventolin Hfa 108 (90 Base) Mcg/act  Aers (Albuterol Sulfate) .Marland Kitchen.. 1-2 Puffs Every 4-6 Hours As Needed 5)  Vitamin B-12 100 Mcg Tabs (Cyanocobalamin) .Marland Kitchen.. 1 Tab Once Daily 6)  Vitamin C 500 Mg Tabs  (Ascorbic Acid) .Marland Kitchen.. 1 Tab Once Daily 7)  Vitamin D 2000 Unit Tabs (Cholecalciferol) .... Take 1 Tablet By Mouth Once A Day 8)  Vitamin E 400 Unit Caps (Vitamin E) .Marland Kitchen.. 1 Cap Once Daily 9)  Spiriva Handihaler 18 Mcg Caps (Tiotropium Bromide Monohydrate) .... Once A Day 10)  Furosemide 20 Mg Tabs (Furosemide) .... Take One Tablet By Mouth Daily. 11)  Potassium Chloride Crys Cr 20 Meq Cr-Tabs (Potassium Chloride Crys Cr) .... Take One Tablet By Mouth Daily 12)  Benzonatate 200 Mg Caps (Benzonatate) .Marland Kitchen.. 1 By Mouth At Bedtime As Needed For Cough 13)  Aerochamber Z-Stat Plus  Misc (Spacer/aero-Holding Chambers) .... As Directed With Mdi  Allergies (verified): No Known Drug Allergies  Past History:  Past Medical History: Last updated: 01/28/2010 NonSTEMI  10/10  CAD, Native vessel s/p overlapping Xience DES stents mid Circumflex 04/08/09. 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  No  alcohol.   Remote use of tobacco, none recently He had one daughter die at 51.  Social History: Smoking Status:  quit  Review of  Systems       The patient complains of dyspnea on exertion.  The patient denies anorexia, fever, weight loss, weight gain, vision loss, decreased hearing, hoarseness, chest pain, syncope, peripheral edema, prolonged cough, headaches, hemoptysis, abdominal pain, melena, hematochezia, severe indigestion/heartburn, hematuria, muscle weakness, suspicious skin lesions, difficulty walking, depression, unusual weight change, abnormal bleeding, enlarged lymph nodes, and  angioedema.    Vital Signs:  Patient profile:   77 year old male Height:      64 inches Weight:      161.6 pounds BMI:     27.84 O2 Sat:      94 % on Room air Temp:     97.8 degrees F oral Pulse rate:   70 / minute BP sitting:   110 / 70  (right arm) Cuff size:   regular  Vitals Entered By: Zackery Barefoot CMA (April 14, 2010 2:42 PM)  O2 Flow:  Room air CC: Pt here for follow up. Pt c/o wheezing, dry "hacking" cough Comments Medications reviewed with patient Verified contact number and pharmacy with patient Zackery Barefoot CMA  April 14, 2010 2:43 PM    Physical Exam  Additional Exam:  Gen. Pleasant, well-nourished, in no distress, normal affect, hard of hearing ENT - no lesions, no post nasal drip Neck: No JVD, no thyromegaly, no carotid bruits Lungs: no use of accessory muscles, no dullness to percussion, decreased lt base without rales or rhonchi  Cardiovascular: Rhythm regular, heart sounds  normal, no murmurs or gallops, 1+ peripheral edema Musculoskeletal: No deformities, no cyanosis or clubbing      Impression & Recommendations:  Problem # 1:  COPD (ICD-496) We discussed albuterol DMI with spacer for rescue ONLY OK to take spiriva every other day  We observed & educated family re: spacer technique  Medications Added to Medication List This Visit: 1)  Aerochamber Z-stat Plus Misc (Spacer/aero-holding chambers) .... As directed with mdi  Other Orders: Est. Patient Level III (19147)  Patient Instructions: 1)  Copy sent to: 2)  Please schedule a follow-up appointment in 4 months with TP 3)  We discussed albuterol DMI with spacer for rescue ONLY 4)  OK to take spiriva every other day  Prescriptions: AEROCHAMBER Z-STAT PLUS  MISC (SPACER/AERO-HOLDING CHAMBERS) as directed with MDI  #1 x 0   Entered by:   Zackery Barefoot CMA   Authorized by:   Comer Locket. Vassie Loll MD   Signed by:   Zackery Barefoot CMA on 04/14/2010   Method used:   Printed then faxed to  ...       [facility] [address] [phone]* (retail)       [address] [place], [place]  [address]       Ph: [phone]       Fax: [phone]   RxID:   [id number]

## 2010-08-05 NOTE — Assessment & Plan Note (Signed)
Summary: 6 month rov.sl   Visit Type:  6 mo f/u Primary Provider:  Rene Paci, MD  CC:  edema/ankles...no other complaints today.  History of Present Illness: 77 yo WM with history of CAD, COPD, BPH and hyperlipidemia admitted to [facility] 04/06/09 with SOB and shoulder pain. He ruled in for MI with serial enzymes. Cardiac cath 04/08/09 with sub-total occlusion of distal Circumflex. Dr. Juanda Chance placed two overlapping drug eluting stents in the mid Circumflex.  He has been taking his Plavix, ASA and statin. He was not started on a beta blocker prior to discharge because of hypotension. He was discharged on Lisinopril and Lasix but both of these medications were stopped because of hypotension. He was admitted to the hospital 05/20/09 with respiratory distress felt to be secondary to COPD exacerbation as well as CHF exacerbation. We diuresed him and pulmonary also saw him and recommended antibiotics, steroids with taper as outpatient, nebulizers, spriva. His diuretics were stopped before discharge because of renal insufficiency. I saw him in the office on 05/29/09 and he was looking much better. I started him back on a low dose of Lasix (20 mg). He has been on Spiriva per Dr. Vassie Loll. He has been seen by both Dr. Vassie Loll from Pulmonary and Dr. Felicity Coyer.    He is doing well today. He reports that his breathing is much better.  He develops some lower extremity edema over the course of the day that resolves at night.  He has been taking all of his medications. No chest pain, near syncope or syncope. Echocardiogram at last visit with continued LV dysfunction with an EF of 30-35%, moderate MR.    Current Medications (verified): 1)  Aspirin Ec 325 Mg Tbec (Aspirin) .... Take One Tablet By Mouth Daily 2)  Plavix 75 Mg Tabs (Clopidogrel Bisulfate) .Marland Kitchen.. 1 Tab Once Daily 3)  Crestor 10 Mg Tabs (Rosuvastatin Calcium) .Marland Kitchen.. 1 Tab At Bedtime 4)  Ventolin Hfa 108 (90 Base) Mcg/act  Aers (Albuterol Sulfate) .Marland Kitchen.. 1-2 Puffs  Every 4-6 Hours As Needed 5)  Vitamin B-12 100 Mcg Tabs (Cyanocobalamin) .Marland Kitchen.. 1 Tab Once Daily 6)  Vitamin C 500 Mg Tabs (Ascorbic Acid) .Marland Kitchen.. 1 Tab Once Daily 7)  Vitamin D 2000 Unit Tabs (Cholecalciferol) .... Take 1 Tablet By Mouth Once A Day 8)  Vitamin E 400 Unit Caps (Vitamin E) .Marland Kitchen.. 1 Cap Once Daily 9)  Spiriva Handihaler 18 Mcg Caps (Tiotropium Bromide Monohydrate) .... Once A Day 10)  Furosemide 20 Mg Tabs (Furosemide) .... Take One Tablet By Mouth Daily. 11)  Potassium Chloride Crys Cr 20 Meq Cr-Tabs (Potassium Chloride Crys Cr) .... Take One Tablet By Mouth Daily 12)  Benzonatate 200 Mg Caps (Benzonatate) .Marland Kitchen.. 1 By Mouth At Bedtime As Needed For Cough  Allergies (verified): No Known Drug Allergies  Past History:  Past Medical History: Reviewed history from 10/01/2009 and no changes required. NonSTEMI  10/10  CAD, Native vessel s/p overlapping Xience DES stents mid Circumflex 04/08/09. CARDIOMYOPATHY, ISCHEMIC (ICD-414.8) DYSLIPIDEMIA (ICD-272.4) COPD (ICD-496) with exacerbation 11/10 BENIGN PROSTATIC HYPERTROPHY, HX OF  ADENOCARCINOMA, COLON, HX OF    MD roster: cards- [facility] pulm- Vassie Loll GI- Medoff onc - Ennever  Social History: Reviewed history from 07/03/2009 and no changes required. 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  No  alcohol.   Remote use of tobacco, none recently He had one daughter die  at 51.  Review of Systems       The patient complains of leg swelling.  The patient denies fatigue, malaise, fever, weight gain/loss, vision loss, decreased hearing, hoarseness, chest pain, palpitations, shortness of breath, prolonged cough, wheezing, sleep apnea, coughing up blood, abdominal pain, blood in stool, nausea, vomiting, diarrhea, heartburn, incontinence, blood in urine, muscle weakness, joint pain, rash, skin  lesions, headache, fainting, dizziness, depression, anxiety, enlarged lymph nodes, easy bruising or bleeding, and environmental allergies.    Vital Signs:  Patient profile:   77 year old male Height:      64 inches Weight:      155 pounds BMI:     26.70 Pulse rate:   68 / minute Pulse rhythm:   irregular BP sitting:   100 / 70  (left arm) Cuff size:   small  Vitals Entered By: Danielle Rankin, CMA (January 09, 2010 12:01 PM)  Physical Exam  General:  General: Well developed, well nourished, NAD Musculoskeletal: Muscle strength 5/5 all ext Psychiatric:Flat affect Neck: No JVD, no carotid bruits, no thyromegaly, no lymphadenopathy. Lungs:Clear bilaterally, no wheezes, rhonci, crackles.  CV: RRR, with systolic murmur at apex,  No gallops rubs Abdomen: soft, NT, ND, BS present Extremities: Trace to 1+ bilateral lower extremity  edema, pulses 1-2+.    Echocardiogram  Procedure date:  07/10/2009  Findings:      Left ventricle: The cavity size was mildly dilated. Wall thickness       was normal. Systolic function was moderately to severely reduced.       The estimated ejection fraction was in the range of 30% to 35%.       The inferior, posterior, and inferoseptal walls were akinetic.       Doppler parameters are consistent with abnormal left ventricular       relaxation (grade 1 diastolic dysfunction).     - Aortic valve: There was no stenosis. Trivial regurgitation.     - Mitral valve: Mildly calcified annulus. There was eccentric       posteriorly-directed mitral regurgitation, likely due to tethering       of the posterior leaflet due to inferoposterior akinesis. The MR       appeared mild but could be moderate due to eccentricity.     - Left atrium: The atrium was mildly dilated.     - Right ventricle: The cavity size was normal. Systolic function was       normal.     - Right atrium: The atrium was mildly dilated.     - Pulmonary arteries: PA peak pressure: 28mm Hg (S).     -  Inferior vena cava: The vessel was normal in size; the       respirophasic diameter changes were in the normal range (= 50%);       findings are consistent with normal central venous pressure.     - Pericardium, extracardiac: A trivial pericardial effusion was       identified posterior to the heart.  EKG  Procedure date:  01/09/2010  Findings:      Sinus rhythm, rate 68 bpm. LAD. Poor R wave progression through the precordial leads.   Impression & Recommendations:  Problem # 1:  CAD, NATIVE VESSEL (ICD-414.01) Stable. Wlll need one full year of ASA/Plavix. (stents October 2010). He has not tolerated beta blockers secondary to hypotension.   His updated medication list for this problem includes:    Aspirin Ec 325 Mg Tbec (Aspirin) .Marland KitchenMarland KitchenMarland KitchenMarland Kitchen  Take one tablet by mouth daily    Plavix 75 Mg Tabs (Clopidogrel bisulfate) .Marland Kitchen... 1 tab once daily  Problem # 2:  SYSTOLIC HEART FAILURE, ACUTE ON CHRONIC (ICD-428.23) Volume status ok. Pt has not tolerated an ace-inhibitor secondary to hypotension. No beta blockers secondary to hypotension. Pt and family refuse consideration for ICD. Continue daily Lasix.   His updated medication list for this problem includes:    Aspirin Ec 325 Mg Tbec (Aspirin) .Marland Kitchen... Take one tablet by mouth daily    Plavix 75 Mg Tabs (Clopidogrel bisulfate) .Marland Kitchen... 1 tab once daily    Furosemide 20 Mg Tabs (Furosemide) .Marland Kitchen... Take one tablet by mouth daily.  Problem # 3:  CARDIOMYOPATHY, ISCHEMIC (ICD-414.8) See above.   His updated medication list for this problem includes:    Aspirin Ec 325 Mg Tbec (Aspirin) .Marland Kitchen... Take one tablet by mouth daily    Plavix 75 Mg Tabs (Clopidogrel bisulfate) .Marland Kitchen... 1 tab once daily    Furosemide 20 Mg Tabs (Furosemide) .Marland Kitchen... Take one tablet by mouth daily.  Problem # 4:  COPD (ICD-496) Per Dr. Vassie Loll.  Doing much better.   His updated medication list for this problem includes:    Ventolin Hfa 108 (90 Base) Mcg/act Aers (Albuterol sulfate) .Marland Kitchen...  1-2 puffs every 4-6 hours as needed    Spiriva Handihaler 18 Mcg Caps (Tiotropium bromide monohydrate) ..... Once a day  Patient Instructions: 1)  Your physician recommends that you schedule a follow-up appointment in: 6 months 2)  Your physician recommends that you continue on your current medications as directed. Please refer to the Current Medication list given to you today.

## 2010-08-05 NOTE — Miscellaneous (Signed)
[facility]   Imported By: Kassie Mends 07/08/2009 08:27:22  _____________________________________________________________________  External Attachment:    Type:   Image     Comment:   External Document

## 2010-08-05 NOTE — Progress Notes (Signed)
Summary: needs to be filled w/Valerie Felicity Coyer PCP  Phone Note Refill Request Call back at Home Phone [phone] Message from:  Patient on April 02, 2010 8:51 AM  Refills Requested: Medication #1:  Hollie Beach 18 MCG CAPS once a day [facility] [address]    Method Requested: Fax to Local Pharmacy Initial call taken by: Lorne Skeens,  April 02, 2010 8:51 AM

## 2010-08-05 NOTE — Assessment & Plan Note (Signed)
Summary: 6 WKS   Visit Type:  6 wk f/u Primary Provider:  Rene Paci, MD  CC:  edema/legs/ankles..no other complaints today.  History of Present Illness: 77 yo WM with history of CAD, COPD, BPH and hyperlipidemia admitted to [facility] 04/06/09 with SOB and shoulder pain. He ruled in for MI with serial enzymes. Cardiac cath 04/08/09 with sub-total occlusion of distal Circumflex. Dr. Juanda Chance placed two overlapping drug eluting stents in the mid Circumflex.  He has been taking his Plavix, ASA and statin. He was not started on a beta blocker prior to discharge because of hypotension. He was discharged on Lisinopril and Lasix but both of these medications were stopped because of hypotension. He was admitted ot the hospital 05/20/09 with respiratory distress felt to be secondary to COPD exacerbation as well as CHF exacerbation. We diuresed him and pulmonary also saw him and recommended antibiotics, steroids with taper as outpatient, nebulizers, spriva. His diuretics were stopped before discharge because of renal insufficiency. I saw him in the office on 05/29/09 and he was looking much better. I started him back on a low dose of Lasix (20 mg). He has been on Spiriva per Dr. Vassie Loll. He has been seen by both Dr. Vassie Loll from Pulmonary and Dr. Felicity Coyer to establish primary care.   He is doing well today. He reports that his breathing is much better although he wife notes that he gets dyspneic with minimal exertion. He develops some lower extremity edema over the course of the day that resolves at night. He has not been elevated his legs as recommended. He has been taking all of his medications. No chest pain, near syncope or syncope. Echocardiogram today was reviewed briefly during his visit (full report to follow) and shows continued LV dysfunction with an EF of 30-35%.    Current Medications (verified): 1)  Aspirin Ec 325 Mg Tbec (Aspirin) .... Take One Tablet By Mouth Daily 2)  Plavix 75 Mg Tabs (Clopidogrel  Bisulfate) .Marland Kitchen.. 1 Tab Once Daily 3)  Crestor 10 Mg Tabs (Rosuvastatin Calcium) .Marland Kitchen.. 1 Tab At Bedtime 4)  Ventolin Hfa 108 (90 Base) Mcg/act  Aers (Albuterol Sulfate) .Marland Kitchen.. 1-2 Puffs Every 4-6 Hours As Needed 5)  Vitamin B-12 100 Mcg Tabs (Cyanocobalamin) .Marland Kitchen.. 1 Tab Once Daily 6)  Vitamin C 500 Mg Tabs (Ascorbic Acid) .Marland Kitchen.. 1 Tab Once Daily 7)  Vitamin D 1000 Unit Tabs (Cholecalciferol) .Marland Kitchen.. 1 Tab Once Daily 8)  Vitamin E 400 Unit Caps (Vitamin E) .Marland Kitchen.. 1 Cap Once Daily 9)  Spiriva Handihaler 18 Mcg Caps (Tiotropium Bromide Monohydrate) .... Once A Day 10)  Furosemide 20 Mg Tabs (Furosemide) .... Take One Tablet By Mouth Daily. 11)  Potassium Chloride Crys Cr 20 Meq Cr-Tabs (Potassium Chloride Crys Cr) .... Take One Tablet By Mouth Daily 12)  Benzonatate 200 Mg Caps (Benzonatate) .... At Bedtime  Allergies: No Known Drug Allergies  Past History:  Past Medical History: NonSTEMI  10/10  CAD, Native vessel s/p overlapping Xience DES stents mid Circumflex 04/08/09. CARDIOMYOPATHY, ISCHEMIC (ICD-414.8) DYSLIPIDEMIA (ICD-272.4) COPD (ICD-496) with exacerbation 11/10 BENIGN PROSTATIC HYPERTROPHY, HX OF (ICD-V13.8) ADENOCARCINOMA, COLON, HX OF (ICD-V10.05)   MD roster: cards- [facility]if[facility] James pulm- Vassie Loll GI- Medoff onc - Ennever  Social History: Reviewed history from 07/03/2009 and no changes required. 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  No  alcohol.   Remote use of tobacco,  none recently He had one daughter die at 76.  Review of Systems       The patient complains of decreased hearing, shortness of breath, and leg swelling.  The patient denies fatigue, malaise, fever, weight gain/loss, vision loss, hoarseness, chest pain, palpitations, prolonged cough, wheezing, sleep apnea, coughing up blood, abdominal pain, blood in stool, nausea, vomiting,  diarrhea, heartburn, incontinence, blood in urine, muscle weakness, joint pain, rash, skin lesions, headache, fainting, dizziness, depression, anxiety, enlarged lymph nodes, easy bruising or bleeding, and environmental allergies.    Vital Signs:  Patient profile:   77 year old male Height:      64 inches Weight:      152 pounds BMI:     26.19 Pulse rate:   72 / minute Pulse rhythm:   regular BP sitting:   104 / 60  (left arm) Cuff size:   large  Vitals Entered By: Danielle Rankin, CMA (July 10, 2009 11:48 AM)  Physical Exam  General:  General: Well developed, well nourished, NAD HEENT: OP clear, mucus membranes moist SKIN: warm, dry Neuro: No focal deficits Musculoskeletal: Muscle strength 5/5 all ext Psychiatric:Flat affect Neck: No JVD, no carotid bruits, no thyromegaly, no lymphadenopathy. Lungs:Clear bilaterally, no wheezes, rhonci, crackles.  CV: RRR, No loud murmurs,  gallops rubs Abdomen: soft, NT, ND, BS present Extremities: Trace to 1+ bilateral lower extremity  edema, pulses 1-2+.    Impression & Recommendations:  Problem # 1:  CAD, NATIVE VESSEL (ICD-414.01) Stable. No change in therapy. He has not been on a beta blocker or Ace-inhibitor secondary to hypotension.   His updated medication list for this problem includes:    Aspirin Ec 325 Mg Tbec (Aspirin) .Marland Kitchen... Take one tablet by mouth daily    Plavix 75 Mg Tabs (Clopidogrel bisulfate) .Marland Kitchen... 1 tab once daily  Problem # 2:  SYSTOLIC HEART FAILURE, ACUTE ON CHRONIC (ICD-428.23) Volume status ok today. Will continue Lasix 20 mg per day. Will check BMET today. He is to continue to follow his weight daily and let us know if it increases by two pounds on two consecutive days or if there is a trend upward. We have discussed the possibility of an ICD given his systolic dysfunction that has not improved since his revascularization with stents 3 months ago. He will discuss this at home with his family and let us know if he  wishes to proceed with further discussions. I would refer him to one of our EP colleagues if he chooses to proceed. His wife is seen by Dr. Graciela Husbands.   His updated medication list for this problem includes:    Aspirin Ec 325 Mg Tbec (Aspirin) .Marland Kitchen... Take one tablet by mouth daily    Plavix 75 Mg Tabs (Clopidogrel bisulfate) .Marland Kitchen... 1 tab once daily    Furosemide 20 Mg Tabs (Furosemide) .Marland Kitchen... Take one tablet by mouth daily.  Problem # 3:  EMPHYSEMA (ICD-492.8) Followed by Dr. Vassie Loll. Most likely a significant part of his dyspnea.  Problem # 4:  CARDIOMYOPATHY, ISCHEMIC (ICD-414.8) See discussion in number 2.   His updated medication list for this problem includes:    Aspirin Ec 325 Mg Tbec (Aspirin) .Marland Kitchen... Take one tablet by mouth daily    Plavix 75 Mg Tabs (Clopidogrel bisulfate) .Marland Kitchen... 1 tab once daily    Furosemide 20 Mg Tabs (Furosemide) .Marland Kitchen... Take one tablet by mouth daily.  Orders: TLB-BMP (Basic Metabolic Panel-BMET) (80048-METABOL)  Patient Instructions: 1)  Your physician recommends that you schedule a  follow-up appointment in: 6 months 2)  Your physician recommends that you continue on your current medications as directed. Please refer to the Current Medication list given to you today.

## 2010-08-05 NOTE — Progress Notes (Signed)
Summary: spiriva  Phone Note Refill Request Message from:  Fax from Pharmacy on April 09, 2010 11:27 AM  Refills Requested: Medication #1:  Hollie Beach 18 MCG CAPS once a day [facility] [address]   Method Requested: Electronic Initial call taken by: Orlan Leavens RMA,  April 09, 2010 11:28 AM    Prescriptions: SPIRIVA HANDIHALER 18 MCG CAPS (TIOTROPIUM BROMIDE MONOHYDRATE) once a day  #30 x 2   Entered by:   Orlan Leavens RMA   Authorized by:   Newt Lukes MD   Signed by:   Orlan Leavens RMA on 04/09/2010   Method used:   Electronically to        [facility] [phone]* (retail)       [address] [place], [place]  [address]       Ph: [phone]       Fax: [phone]   RxID:   [id number]

## 2010-08-07 NOTE — Progress Notes (Signed)
Summary: prescription  Phone Note Call from Patient Call back at Home Phone [phone]   Caller: spouse/Beatrice Call For: Dr. Vassie Loll Summary of Call: Patients wife phoned and stated that the pharmacy told her to call our office becasue he is out of refills on his cough syrup Benzonatate. They would like a new prescription called into [facility] on [address]. Patient can be reached at [phone] Initial call taken by: Vedia Coffer,  July 04, 2010 8:57 AM  Follow-up for Phone Call         Dr. Vassie Loll pt---Pt requsting a refill on benzonatate. Last refill given in Jan 2011. Dr. Maple Hudson please advise if ok to give refill. Carron Curie CMA  July 04, 2010 9:40 AM   Additional Follow-up for Phone Call Additional follow up Details #1::        Per CDY-okay to refill x 2 .Reynaldo Minium CMA  July 04, 2010 9:55 AM   refill sent. pt awre. Carron Curie CMA  July 04, 2010 10:15 AM     Prescriptions: BENZONATATE 200 MG CAPS (BENZONATATE) 1 by mouth at bedtime as needed for cough  #30 x 2   Entered by:   Carron Curie CMA   Authorized by:   Comer Locket. Vassie Loll MD   Signed by:   Carron Curie CMA on 07/04/2010   Method used:   Electronically to        [facility] [address] [phone]* (retail)       [address] [place], [place]  [address]       Ph: [phone]       Fax: [phone]   RxID:   [id number]

## 2010-08-15 ENCOUNTER — Ambulatory Visit: Payer: Self-pay | Admitting: Adult Health

## 2010-08-18 ENCOUNTER — Telehealth: Payer: Self-pay | Admitting: Adult Health

## 2010-08-27 NOTE — Progress Notes (Signed)
Summary: nos appt  Phone Note Call from Patient   Caller: [email or web address]  Call For: Aaron [name] Summary of Call: LMTCB x2 to rsc nos from 2/10. Initial call taken by: Darletta Moll,  August 18, 2010 2:50 PM

## 2010-09-22 ENCOUNTER — Encounter: Payer: Self-pay | Admitting: Internal Medicine

## 2010-09-23 ENCOUNTER — Encounter: Payer: Self-pay | Admitting: Internal Medicine

## 2010-09-23 ENCOUNTER — Ambulatory Visit (INDEPENDENT_AMBULATORY_CARE_PROVIDER_SITE_OTHER): Payer: Medicare Other | Admitting: Internal Medicine

## 2010-09-23 DIAGNOSIS — Z6825 Body mass index (BMI) 25.0-25.9, adult: Secondary | ICD-10-CM

## 2010-09-23 DIAGNOSIS — E663 Overweight: Secondary | ICD-10-CM

## 2010-09-23 DIAGNOSIS — I2510 Atherosclerotic heart disease of native coronary artery without angina pectoris: Secondary | ICD-10-CM

## 2010-09-23 DIAGNOSIS — K219 Gastro-esophageal reflux disease without esophagitis: Secondary | ICD-10-CM

## 2010-09-23 DIAGNOSIS — C189 Malignant neoplasm of colon, unspecified: Secondary | ICD-10-CM

## 2010-09-23 DIAGNOSIS — E785 Hyperlipidemia, unspecified: Secondary | ICD-10-CM

## 2010-09-23 DIAGNOSIS — J4489 Other specified chronic obstructive pulmonary disease: Secondary | ICD-10-CM

## 2010-09-23 DIAGNOSIS — J449 Chronic obstructive pulmonary disease, unspecified: Secondary | ICD-10-CM

## 2010-09-23 MED ORDER — BENZONATATE 200 MG PO CAPS: 200.0000 mg | ORAL_CAPSULE | Freq: Every day | ORAL | Status: DC

## 2010-09-23 MED ORDER — RANITIDINE HCL 150 MG PO TABS: 150.0000 mg | ORAL_TABLET | Freq: Every day | ORAL | Status: DC

## 2010-09-23 NOTE — Assessment & Plan Note (Signed)
Cont nocturnal psuedowheeze - scheduled benzonate and H2B for acid suppression rec - Reviewed meds, no other changes

## 2010-09-23 NOTE — Patient Instructions (Addendum)
Good to see you today. Refill on medications as discussed today. Ok to increase activity as tolerated as discussed

## 2010-09-23 NOTE — Assessment & Plan Note (Signed)
Follows with cards - no active anginal symptoms - cont same meds

## 2010-09-23 NOTE — Assessment & Plan Note (Signed)
Lab Results  Component Value Date   LDLCALC 90 05/20/2010  i have reviewed last labs - no med changes rec at this time

## 2010-09-23 NOTE — Progress Notes (Signed)
  Subjective:    Patient ID: BRANT PEETS, male    DOB: August 07, 1924, 77 y.o.   MRN: [id number]  HPI  here for 3-6 mo followup - wife and dtr asst with hx as needed   1) COPD hx - feels breathing is baseline today much improved since hosp fall 2010 -  attributes Spiriva to his improved symptoms - no need for rescue MDI use chronic mild cough; no SOB, DOE or PND - cont mild wheeze when bending forward or prone- reports compliance with ongoing medical treatment and no changes in medication dose or frequency. denies adverse side effects related to current therapy.   2) NSTEMI 04/2009 with CAD and Isch CM (chronic HF)- denies cp, pnd; improvement in chronic peiph edema - reports compliance with ongoing medical treatment and no changes in medication dose or frequency. denies adverse side effects related to current therapy.  3) hx colon ca, s/p surg resection 2008 no bowel changes or diarrhea; no weight changes or hematocezia released by onc this year 2011-   4) dyslipidemia- reports compliance with ongoing medical treatment and no changes in medication dose or frequency. denies adverse side effects related to current therapy. no muscle or GI problems  Past Medical History  Diagnosis Date  . CAD (coronary artery disease) 04/08/09    Native vessel s/p overlapoing Xience DES stent mid Circumflex  . Other specified forms of chronic ischemic heart disease   . COPD (chronic obstructive pulmonary disease) 05/2009    with exacerbation  . Cancer     Colon  . Acute on chronic systolic heart failure   . Acute myocardial infarction, unspecified site, episode of care unspecified   . 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 Sigmoid colectomy     Done by Dr. Darnell Level  . Tonsillectomy     childhood  . Appendectomy     reports that he has quit smoking. He does not have any smokeless tobacco history on file. He reports that  he does not drink alcohol or use illicit drugs. family history is not on file. No Known Allergies   Review of Systems Patient denies chest pain, edema or dyspnea on exertion. No headache, rash or abdominal pain. No specific complaints in a complete review of systems (except as listed above).   Objective:   Physical Exam BP 118/68  Pulse 70  Temp(Src) 98.4 F (36.9 C) (Oral)  Ht 5\' 4"  (1.626 m)  Wt 158 lb 12.8 oz (72.031 kg)  BMI 27.26 kg/m2 General appearance: cooperative, appears stated age and no distress Lungs: diminished breath sounds bilaterally and no whheze or crackle Heart: regular rate and rhythm and no rub Psyc: sedate but easily arousable - AAOx3. Not depressed or anxious appearing  Assessment & Plan:  See problem list Time spent with pt/family today 30 minutes, greater than 50% time spent counseling patient on COPD, CAD and medication review. Also review of prior records.

## 2010-09-23 NOTE — Assessment & Plan Note (Signed)
Hx dx and resection 2008 - no longer actively following with surg or onc -

## 2010-10-08 LAB — BASIC METABOLIC PANEL WITH GFR
BUN: 14 mg/dL (ref 6–23)
CO2: 23 meq/L (ref 19–32)
Calcium: 8.5 mg/dL (ref 8.4–10.5)
Chloride: 91 meq/L — ABNORMAL LOW (ref 96–112)
Creatinine, Ser: 0.99 mg/dL (ref 0.4–1.5)
GFR calc non Af Amer: >60 mL/min
Glucose, Bld: 101 mg/dL — ABNORMAL HIGH (ref 70–99)
Potassium: 4.3 meq/L (ref 3.5–5.1)
Sodium: 122 meq/L — ABNORMAL LOW (ref 135–145)

## 2010-10-08 LAB — STREP PNEUMONIAE ANTIBODY SEROTYPES
Strep pneumo Type 12: 10.69 ug/mL
Strep pneumo Type 19: 37.77 ug/mL
Strep pneumo Type 9: 13.26 ug/mL
Strep pneumoniae Type 1 Abs: 0.61 ug/mL
Strep pneumoniae Type 23F Abs: 0.35 ug/mL
Strep pneumoniae Type 3 Abs: 3.39 ug/mL
Strep pneumoniae Type 6B Abs: 1.46 ug/mL
Strep pneumoniae Type 7F Abs: 5.95 ug/mL
Strep pneumoniae Type 8 Abs: >19.13 ug/mL

## 2010-10-08 LAB — CBC
HCT: 36.6 % — ABNORMAL LOW (ref 39.0–52.0)
HCT: 39.5 % (ref 39.0–52.0)
HCT: 36.6 % — ABNORMAL LOW (ref 39.0–52.0)
Hemoglobin: 12.4 g/dL — ABNORMAL LOW (ref 13.0–17.0)
Hemoglobin: 13.5 g/dL (ref 13.0–17.0)
MCHC: 33.9 g/dL (ref 30.0–36.0)
MCHC: 34.2 g/dL (ref 30.0–36.0)
MCHC: 33.7 g/dL (ref 30.0–36.0)
MCV: 94.1 fL (ref 78.0–100.0)
MCV: 94.2 fL (ref 78.0–100.0)
MCV: 95.1 fL (ref 78.0–100.0)
Platelets: 127 10*3/uL — ABNORMAL LOW (ref 150–400)
Platelets: 144 10*3/uL — ABNORMAL LOW (ref 150–400)
Platelets: 105 10*3/uL — ABNORMAL LOW (ref 150–400)
RBC: 3.88 MIL/uL — ABNORMAL LOW (ref 4.22–5.81)
RBC: 4.2 MIL/uL — ABNORMAL LOW (ref 4.22–5.81)
RDW: 14.5 % (ref 11.5–15.5)
RDW: 14.5 % (ref 11.5–15.5)
WBC: 12.4 10*3/uL — ABNORMAL HIGH (ref 4.0–10.5)
WBC: 3.6 10*3/uL — ABNORMAL LOW (ref 4.0–10.5)
WBC: 5.4 10*3/uL (ref 4.0–10.5)

## 2010-10-08 LAB — BASIC METABOLIC PANEL
BUN: 14 mg/dL (ref 6–23)
BUN: 17 mg/dL (ref 6–23)
BUN: 25 mg/dL — ABNORMAL HIGH (ref 6–23)
CO2: 29 mEq/L (ref 19–32)
CO2: 29 mEq/L (ref 19–32)
CO2: 30 mEq/L (ref 19–32)
Calcium: 8.8 mg/dL (ref 8.4–10.5)
Chloride: 88 mEq/L — ABNORMAL LOW (ref 96–112)
Chloride: 92 mEq/L — ABNORMAL LOW (ref 96–112)
Creatinine, Ser: 1.57 mg/dL — ABNORMAL HIGH (ref 0.4–1.5)
Creatinine, Ser: 1.59 mg/dL — ABNORMAL HIGH (ref 0.4–1.5)
GFR calc Af Amer: 51 mL/min — ABNORMAL LOW (ref >60)
GFR calc non Af Amer: 58 mL/min — ABNORMAL LOW (ref >60)
Glucose, Bld: 167 mg/dL — ABNORMAL HIGH (ref 70–99)
Glucose, Bld: 89 mg/dL (ref 70–99)
Potassium: 3.5 mEq/L (ref 3.5–5.1)
Potassium: 3.6 mEq/L (ref 3.5–5.1)

## 2010-10-08 LAB — CARDIAC PANEL(CRET KIN+CKTOT+MB+TROPI)
CK, MB: 8.2 ng/mL — ABNORMAL HIGH (ref 0.3–4.0)
Relative Index: 7 — ABNORMAL HIGH (ref 0.0–2.5)
Total CK: 117 U/L (ref 7–232)

## 2010-10-08 LAB — BRAIN NATRIURETIC PEPTIDE
Pro B Natriuretic peptide (BNP): 763 pg/mL — ABNORMAL HIGH (ref 0.0–100.0)
Pro B Natriuretic peptide (BNP): 797 pg/mL — ABNORMAL HIGH (ref 0.0–100.0)
Pro B Natriuretic peptide (BNP): 1149 pg/mL — ABNORMAL HIGH (ref 0.0–100.0)

## 2010-10-08 LAB — STREP PNEUMONIAE URINARY ANTIGEN: Strep Pneumo Urinary Antigen: NEGATIVE

## 2010-10-08 LAB — GLUCOSE, CAPILLARY: Glucose-Capillary: 95 mg/dL (ref 70–99)

## 2010-10-08 LAB — TSH: TSH: 1.6 u[IU]/mL (ref 0.350–4.500)

## 2010-10-08 LAB — MAGNESIUM: Magnesium: 2.2 mg/dL (ref 1.5–2.5)

## 2010-10-08 LAB — LEGIONELLA ANTIGEN, URINE

## 2010-10-09 LAB — COMPREHENSIVE METABOLIC PANEL
ALT: 48 U/L (ref 0–53)
AST: 27 U/L (ref 0–37)
Alkaline Phosphatase: 156 U/L — ABNORMAL HIGH (ref 39–117)
CO2: 30 mEq/L (ref 19–32)
Chloride: 104 mEq/L (ref 96–112)
GFR calc Af Amer: 55 mL/min — ABNORMAL LOW (ref >60)
GFR calc non Af Amer: 46 mL/min — ABNORMAL LOW (ref >60)
Potassium: 4.2 mEq/L (ref 3.5–5.1)
Sodium: 141 mEq/L (ref 135–145)
Total Bilirubin: 0.9 mg/dL (ref 0.3–1.2)

## 2010-10-09 LAB — POCT I-STAT 3, ART BLOOD GAS (G3+)
Acid-Base Excess: 1 mmol/L (ref 0.0–2.0)
Bicarbonate: 25.4 mEq/L — ABNORMAL HIGH (ref 20.0–24.0)
Bicarbonate: 25.7 mEq/L — ABNORMAL HIGH (ref 20.0–24.0)
TCO2: 27 mmol/L (ref 0–100)
TCO2: 27 mmol/L (ref 0–100)
pH, Arterial: 7.387 (ref 7.350–7.450)
pO2, Arterial: 34 mmHg — CL (ref 80.0–100.0)

## 2010-10-09 LAB — BASIC METABOLIC PANEL
BUN: 19 mg/dL (ref 6–23)
BUN: 25 mg/dL — ABNORMAL HIGH (ref 6–23)
BUN: 25 mg/dL — ABNORMAL HIGH (ref 6–23)
BUN: 29 mg/dL — ABNORMAL HIGH (ref 6–23)
CO2: 29 mEq/L (ref 19–32)
Calcium: 8.2 mg/dL — ABNORMAL LOW (ref 8.4–10.5)
Chloride: 101 mEq/L (ref 96–112)
Creatinine, Ser: 1.36 mg/dL (ref 0.4–1.5)
Creatinine, Ser: 1.48 mg/dL (ref 0.4–1.5)
Creatinine, Ser: 1.64 mg/dL — ABNORMAL HIGH (ref 0.4–1.5)
Creatinine, Ser: 1.85 mg/dL — ABNORMAL HIGH (ref 0.4–1.5)
GFR calc Af Amer: 43 mL/min — ABNORMAL LOW (ref >60)
GFR calc Af Amer: 45 mL/min — ABNORMAL LOW (ref >60)
GFR calc Af Amer: 49 mL/min — ABNORMAL LOW (ref >60)
GFR calc non Af Amer: 35 mL/min — ABNORMAL LOW (ref >60)
GFR calc non Af Amer: 38 mL/min — ABNORMAL LOW (ref >60)
GFR calc non Af Amer: 46 mL/min — ABNORMAL LOW (ref >60)
GFR calc non Af Amer: 50 mL/min — ABNORMAL LOW (ref >60)
Glucose, Bld: 101 mg/dL — ABNORMAL HIGH (ref 70–99)
Glucose, Bld: 92 mg/dL (ref 70–99)
Potassium: 3.7 mEq/L (ref 3.5–5.1)
Potassium: 4.2 mEq/L (ref 3.5–5.1)
Potassium: 4.5 mEq/L (ref 3.5–5.1)
Sodium: 136 mEq/L (ref 135–145)
Sodium: 141 mEq/L (ref 135–145)

## 2010-10-09 LAB — CBC
HCT: 33.7 % — ABNORMAL LOW (ref 39.0–52.0)
HCT: 38.3 % — ABNORMAL LOW (ref 39.0–52.0)
Hemoglobin: 11.4 g/dL — ABNORMAL LOW (ref 13.0–17.0)
Hemoglobin: 12.3 g/dL — ABNORMAL LOW (ref 13.0–17.0)
Hemoglobin: 13.1 g/dL (ref 13.0–17.0)
MCHC: 33.1 g/dL (ref 30.0–36.0)
MCHC: 33.5 g/dL (ref 30.0–36.0)
MCV: 95.9 fL (ref 78.0–100.0)
MCV: 96.1 fL (ref 78.0–100.0)
Platelets: 172 10*3/uL (ref 150–400)
Platelets: 186 10*3/uL (ref 150–400)
RBC: 3.52 MIL/uL — ABNORMAL LOW (ref 4.22–5.81)
RBC: 3.85 MIL/uL — ABNORMAL LOW (ref 4.22–5.81)
RBC: 3.86 MIL/uL — ABNORMAL LOW (ref 4.22–5.81)
RDW: 13.7 % (ref 11.5–15.5)
WBC: 6.4 10*3/uL (ref 4.0–10.5)
WBC: 6.5 10*3/uL (ref 4.0–10.5)
WBC: 6.8 10*3/uL (ref 4.0–10.5)
WBC: 8.8 10*3/uL (ref 4.0–10.5)

## 2010-10-09 LAB — URINALYSIS, MICROSCOPIC ONLY
Glucose, UA: NEGATIVE mg/dL
Hgb urine dipstick: NEGATIVE
Ketones, ur: NEGATIVE mg/dL
Protein, ur: NEGATIVE mg/dL
Urobilinogen, UA: 0.2 mg/dL (ref 0.0–1.0)

## 2010-10-09 LAB — URINE CULTURE
Colony Count: NO GROWTH
Culture: NO GROWTH

## 2010-10-09 LAB — CARDIAC PANEL(CRET KIN+CKTOT+MB+TROPI)
CK, MB: 2.2 ng/mL (ref 0.3–4.0)
CK, MB: 2.7 ng/mL (ref 0.3–4.0)
Relative Index: INVALID (ref 0.0–2.5)
Total CK: 74 U/L (ref 7–232)
Total CK: 95 U/L (ref 7–232)
Troponin I: 3.08 ng/mL (ref 0.00–0.06)

## 2010-10-09 LAB — APTT: aPTT: 27 seconds (ref 24–37)

## 2010-10-09 LAB — LIPID PANEL
HDL: 40 mg/dL (ref >39)
Total CHOL/HDL Ratio: 3.3 RATIO
Triglycerides: 43 mg/dL (ref <150)
VLDL: 9 mg/dL (ref 0–40)

## 2010-10-09 LAB — TSH: TSH: 1.418 u[IU]/mL (ref 0.350–4.500)

## 2010-10-09 LAB — BRAIN NATRIURETIC PEPTIDE: Pro B Natriuretic peptide (BNP): 2178 pg/mL — ABNORMAL HIGH (ref 0.0–100.0)

## 2010-11-06 ENCOUNTER — Ambulatory Visit (INDEPENDENT_AMBULATORY_CARE_PROVIDER_SITE_OTHER): Payer: Medicare Other | Admitting: Cardiovascular Disease

## 2010-11-06 ENCOUNTER — Encounter: Payer: Self-pay | Admitting: Cardiovascular Disease

## 2010-11-06 VITALS — BP 112/64 | HR 74 | Ht 61.0 in | Wt 122.0 lb

## 2010-11-06 DIAGNOSIS — I2589 Other forms of chronic ischemic heart disease: Secondary | ICD-10-CM

## 2010-11-06 DIAGNOSIS — E785 Hyperlipidemia, unspecified: Secondary | ICD-10-CM

## 2010-11-06 DIAGNOSIS — I2510 Atherosclerotic heart disease of native coronary artery without angina pectoris: Secondary | ICD-10-CM

## 2010-11-06 NOTE — Assessment & Plan Note (Signed)
Stable. Volume status ok. Continue current meds.

## 2010-11-06 NOTE — Assessment & Plan Note (Signed)
Stable No changes 

## 2010-11-06 NOTE — Progress Notes (Signed)
History of Present Illness:77 yo WM with history of CAD, COPD, BPH and hyperlipidemia admitted to [facility] 04/06/09 with SOB and shoulder pain. He ruled in for MI with serial enzymes. Cardiac cath 04/08/09 with sub-total occlusion of distal Circumflex. Dr. Juanda Chance placed two overlapping drug eluting stents in the mid Circumflex.  He has been taking his Plavix, ASA and statin. He was not started on a beta blocker prior to discharge because of hypotension. He was discharged on Lisinopril and Lasix but both of these medications were stopped because of hypotension. He was admitted to the hospital 05/20/09 with respiratory distress felt to be secondary to COPD exacerbation as well as CHF exacerbation. We diuresed him and pulmonary also saw him and recommended antibiotics, steroids with taper as outpatient, nebulizers, spriva. His diuretics were stopped before discharge because of renal insufficiency. I started him back on a low dose of Lasix (20 mg). He has been on Spiriva per Dr. Vassie Loll.  Primary Care:  Dr. Rene Paci.    He is doing well today. He reports that his breathing is much better.  He develops some lower extremity edema over the course of the day that resolves at night.  He has been taking all of his medications. No chest pain, near syncope or syncope. Echocardiogram 2011 with continued LV dysfunction with an EF of 30-35%, moderate MR.    Past Medical History  Diagnosis Date  . CAD (coronary artery disease) 04/08/09    Native vessel s/p overlapoing Xience DES stent mid Circumflex  . Other specified forms of chronic ischemic heart disease   . Acute on chronic systolic heart failure   . 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 COPD (chronic obstructive pulmonary disease) 05/2009  . Hyperlipidemia   . Colon cancer 2008    Colon  . Acute myocardial infarction, unspecified site, episode of care unspecified 10/2008    Past Surgical History  Procedure Date  . Sigmoid  colectomy     Done by Dr. Darnell Level  . Tonsillectomy     childhood  . Appendectomy     Current Outpatient Prescriptions  Medication Sig Dispense Refill  . albuterol (VENTOLIN HFA) 108 (90 BASE) MCG/ACT inhaler Inhale into the lungs every 6 (six) hours as needed.        . Ascorbic Acid (VITAMIN C) 500 MG tablet Take 500 mg by mouth daily.        Marland Kitchen aspirin 325 MG EC tablet Take 325 mg by mouth daily.        . benzonatate (TESSALON) 200 MG capsule Take 1 capsule (200 mg total) by mouth at bedtime.  30 capsule  6  . Cholecalciferol (VITAMIN D) 2000 UNIT CAPS Take by mouth daily.        . furosemide (LASIX) 20 MG tablet Take 20 mg by mouth daily.        . potassium chloride SA (K-DUR,KLOR-CON) 20 MEQ tablet Take 20 mEq by mouth daily.        . simvastatin (ZOCOR) 40 MG tablet Take 40 mg by mouth at bedtime.        Marland Kitchen Spacer/Aero-Holding Chambers (AEROCHAMBER Z-STAT PLUS CHAMBR) MISC by Does not apply route. Use as directed with MDI       . tiotropium (SPIRIVA HANDIHALER) 18 MCG inhalation capsule Place 18 mcg into inhaler and inhale every other day.        . vitamin B-12 (CYANOCOBALAMIN) 100 MCG tablet Take 100 mcg by mouth daily.        Marland Kitchen  vitamin E 400 UNIT capsule Take 400 Units by mouth daily.        . ranitidine (ZANTAC) 150 MG tablet Take 1 tablet (150 mg total) by mouth daily.  30 tablet  6    No Known Allergies  History   Social History  . Marital Status: Married    Spouse Name: N/A    Number of Children: N/A  . Years of Education: N/A   Occupational History  . Not on file.   Social History Main Topics  . Smoking status: Former Smoker    Types: Cigarettes  . Smokeless tobacco: Not on file  . Alcohol Use: No  . Drug Use: No  . Sexually Active: Not on file   Other Topics Concern  . Not on file   Social History Narrative  . No narrative on file    No family history on file.  Review of Systems:  As stated in the HPI and otherwise negative.   BP 112/64  Pulse 74   Ht 5\' 1"  (1.549 m)  Wt 122 lb (55.339 kg)  BMI 23.05 kg/m2  Physical Examination: General: Well developed, well nourished, NAD HEENT: OP clear, mucus membranes moist SKIN: warm, dry. No rashes. Neuro: No focal deficits Musculoskeletal: Muscle strength 5/5 all ext Psychiatric: Mood and affect normal Neck: No JVD, no carotid bruits, no thyromegaly, no lymphadenopathy. Lungs:Clear bilaterally, no wheezes, rhonci, crackles Cardiovascular: Regular rate and rhythm. No murmurs, gallops or rubs. Abdomen:Soft. Bowel sounds present. Non-tender.  Extremities: No lower extremity edema. Pulses are 2 + in the bilateral DP/PT.

## 2010-11-06 NOTE — Assessment & Plan Note (Signed)
Last check lipids November 2011. LDL at 90, total chol: 145. Repeat Lipids and LFTs in 6 months. Continue statin.

## 2010-11-06 NOTE — Patient Instructions (Signed)
Your physician wants you to follow-up in: 6 months. You will receive a reminder letter in the mail two months in advance. If you don't receive a letter, please call our office to schedule the follow-up appointment.  Your physician recommends that you return for FASTING lab work in: 6 months- this can be done the day of your follow up appointment with Dr. Clifton James.- lipid/liver [phone]).  Your physician recommends that you continue on your current medications as directed. Please refer to the Current Medication list given to you today.

## 2010-11-18 NOTE — Discharge Summary (Signed)
Aaron [name], Aaron [name]               ACCOUNT NO.:  [id number]   MEDICAL RECORD NO.:  [id number]          PATIENT TYPE:  INP   LOCATION:  1539                         FACILITY:  [facility]   PHYSICIAN:  Velora Heckler, MD      DATE OF BIRTH:  [birth year]   DATE OF ADMISSION:  05/03/2007  DATE OF DISCHARGE:  05/07/2007                               DISCHARGE SUMMARY   REASON FOR ADMISSION:  Sigmoid colon carcinoma.   HISTORY OF PRESENT ILLNESS:  The patient is a 77 year old white male  from [place], [place].  The patient was referred by Dr. Ritta Slot, with newly-diagnosed adenocarcinoma of the colon.  This was  found on screening colonoscopy April 21, 2007.  Dr. Kinnie Scales identified  a 4-cm mass at the rectosigmoid junction.  Biopsies confirmed invasive  adenocarcinoma.  The patient was prepared preoperatively and is now  brought to the hospital to undergo sigmoid colectomy.   HOSPITAL COURSE:  The patient was admitted on October 28.  He was  prepared and taken directly to the operating room where he underwent  sigmoid colectomy.  Postoperative course was uncomplicated.  The patient  began a clear liquid diet on the second postoperative day.  He was  advanced rapidly to a regular diet, which he tolerated well.  Final  pathology showed adenocarcinoma with negative lymph nodes, making it a  T2, N0 tumor.  The patient progressed nicely and was prepared for  discharge home, on the morning of May 07, 2007.   DISCHARGE PLANNING:  The patient discharged home May 07, 2007 in  good condition, tolerating a regular diet, and ambulating independently.   DISCHARGE MEDICATIONS:  Include Darvocet as needed for pain.  Home  health appliances were delivered to the home, including a bedside  commode and a four-point walker.  The patient will be seen back in my  office at [facility], in 5 days for wound check.   FINAL DIAGNOSIS:  Adenocarcinoma, sigmoid colon.   CONDITION  AT DISCHARGE:  Good.      Velora Heckler, MD  Electronically Signed     TMG/MEDQ  D:  05/27/2007  T:  05/27/2007  Job:  [id number]   cc:   Griffith Citron, M.D.  Fax: [phone]   Alfonse Alpers. Dagoberto Ligas, M.D.  Fax: [phone]

## 2010-11-18 NOTE — Op Note (Signed)
NAMEALEXIO, Aaron [name] NO.:  [id number]   MEDICAL RECORD NO.:  [id number]          PATIENT TYPE:  INP   LOCATION:  X007                         FACILITY:  [facility]   PHYSICIAN:  Velora Heckler, MD      DATE OF BIRTH:  08-09-1924   DATE OF PROCEDURE:  05/03/2007  DATE OF DISCHARGE:                               OPERATIVE REPORT   PREOPERATIVE DIAGNOSIS:  Sigmoid colon carcinoma.   POSTOPERATIVE DIAGNOSIS:  Sigmoid colon carcinoma.   PROCEDURE:  Sigmoid colectomy.   SURGEON:  Velora Heckler, MD, FACS   ASSISTANT:  Anselm Pancoast. Zachery Dakins, MD, FACS   ANESTHESIA:  General.   ESTIMATED BLOOD LOSS:  Minimal.   PREPARATION:  Betadine.   COMPLICATIONS:  None.   INDICATIONS:  The patient is a 77 year old white male from [place],  [place].  He presented on April 28, 2007, to my practice on  referral from Dr. Sharrell Ku with newly-diagnosed adenocarcinoma of  the colon.  The patient had undergone a screening colonoscopy on April 21, 2007.  Dr. Kinnie Scales identified a 4-cm mass at the rectosigmoid  junction.  Biopsies confirmed invasive adenocarcinoma.  The patient now  comes to surgery for resection.   BODY OF REPORT:  The procedure is done in OR #1 at the [facility].  The patient is brought to the operating room,  placed in a supine position on the operating room table.  Following  administration of general anesthesia, the patient is positioned in  lithotomy and then prepped and draped in the usual strict aseptic  fashion.  After ascertaining that an adequate level of anesthesia had  been obtained, a midline abdominal incision is made with a #10 blade.  Dissection is carried through subcutaneous tissues.  Fascia is incised  in the midline.  The peritoneal cavity is entered cautiously.  Abdomen  is explored.  Liver is normal without palpable abnormality.  Small bowel  appears normal.  Stomach is normal.  Spleen is normal.  The colon  appears to have adhesions to the cecum consistent with previous  appendectomy.  Remainder of the colon is normal to palpation.  There is  a mass, however, at the distal sigmoid colon just above the peritoneal  reflection.  This is consistent with the carcinoma identified on  colonoscopy.   A Balfour retractor is placed and small bowel is packed cephalad.  Peritoneum is incised lateral to the sigmoid colon along the white line,  allowing for mobilization.  Mesentery is also incised with the  electrocautery.  Dissection is carried into the retroperitoneum and the  left ureter is identified and preserved.  A point in the proximal  sigmoid colon is selected and transected with a GIA stapler.  Mesentery  is divided using the LigaSure.  Superior hemorrhoidal artery is divided  between [facility] clamps and ligated with 2-0 silk ties.  Dissection is  carried distally to the mesentery of the proximal rectum.  The bowel is  then transected between bowel clamps at the level of the rectosigmoid  junction, which is approximately 6-7 cm  distal to the distal margin of  the tumor.  Stay sutures are placed in the proximal rectum.  The  specimen is submitted fresh to pathology.  The stapled closure of the  specimen is the proximal end.  The opened end of the specimen is the  distal end.  Good hemostasis is noted along the mesenteric resection.   Next an end-to-end anastomosis is created between the proximal sigmoid  and the proximal rectum.  This was performed and a single layer with  interrupted 3-0 silk sutures.  There is no tension on the anastomosis.  Good hemostasis is noted.  The pelvis was irrigated copiously with warm  saline, which is evacuated.  Good hemostasis is noted.  All sponges are  removed from the peritoneal cavity.  Omentum is used to cover the bowel.  Abdominal wall fascia is closed with interrupted #1 Novofil simple  sutures.  Subcutaneous tissues are irrigated.  The patient is enrolled   in a clinical trial using a gentamicin-impregnated Gelfoam sponge.  The  sponges are cut in the fashion recommended by the manufacturer.  They  are briefly soaked in saline and allowed to remain in the bottom of a  dry basin for approximately 2 minutes prior to insertion into the  subcutaneous tissues.  The entire two sponges are inserted into the  subcutaneous tissues and then the skin is closed with stainless steel  staples.  The length of the incision measures 24.5 cm prior to closure.  Wound is washed and dried and dry gauze dressings are applied.  The  patient is awakened from anesthesia and brought to the recovery room in  stable condition.  The patient tolerated the procedure well.      Velora Heckler, MD  Electronically Signed     TMG/MEDQ  D:  05/03/2007  T:  05/04/2007  Job:  [id number]   cc:   Griffith Citron, M.D.  Fax: [phone]   Alfonse Alpers. Dagoberto Ligas, M.D.  Fax: [phone]

## 2010-11-25 ENCOUNTER — Telehealth: Payer: Self-pay | Admitting: Cardiovascular Disease

## 2010-11-25 NOTE — Telephone Encounter (Signed)
Refill fursoemide 20 mg. [facility] on [address]. [phone].

## 2010-11-26 MED ORDER — FUROSEMIDE 20 MG PO TABS: 20.0000 mg | ORAL_TABLET | Freq: Every day | ORAL | Status: DC

## 2011-01-01 ENCOUNTER — Encounter: Payer: Self-pay | Admitting: Internal Medicine

## 2011-01-20 ENCOUNTER — Encounter: Payer: Self-pay | Admitting: Internal Medicine

## 2011-01-20 ENCOUNTER — Ambulatory Visit (INDEPENDENT_AMBULATORY_CARE_PROVIDER_SITE_OTHER): Payer: Medicare Other | Admitting: Internal Medicine

## 2011-01-20 DIAGNOSIS — E785 Hyperlipidemia, unspecified: Secondary | ICD-10-CM

## 2011-01-20 DIAGNOSIS — J449 Chronic obstructive pulmonary disease, unspecified: Secondary | ICD-10-CM

## 2011-01-20 DIAGNOSIS — I2510 Atherosclerotic heart disease of native coronary artery without angina pectoris: Secondary | ICD-10-CM

## 2011-01-20 NOTE — Assessment & Plan Note (Signed)
Improved nocturnal pseudo wheeze on scheduled benzonate and H2B for acid suppression; continue same Reviewed pulm meds, no other changes  

## 2011-01-20 NOTE — Assessment & Plan Note (Signed)
MI 04/2009 - Follows with cards - no active anginal symptoms - cont same meds  

## 2011-01-20 NOTE — Assessment & Plan Note (Signed)
On simva - last lipids at goal The current medical regimen is effective;  continue present plan and medications.

## 2011-01-20 NOTE — Patient Instructions (Signed)
Good to see you today. We have reviewed your prior records including labs and tests today - next labs in November - here or with Dr. Clifton James Medications reviewed, no changes at this time. Please schedule followup in 4-6 months, call sooner if problems.

## 2011-01-20 NOTE — Progress Notes (Signed)
  Subjective:    Patient ID: Aaron [name], male    DOB: 13-Aug-1924, 77 y.o.   MRN: [id number]  HPI  here for follow up  wife and dtr asst with hx as needed   COPD -  breathing is baseline today hosp fall 2010 for exac -  on Spiriva without need for rescue MDI use chronic mild cough controlled with tessalon; no SOB, DOE or PND - reports compliance with ongoing medical treatment and no changes in medication dose or frequency. denies adverse side effects related to current therapy.   NSTEMI 04/2009 with CAD and Isch CM (chronic HF)- denies cp, pnd; mild chronic periph edema - reports compliance with ongoing medical treatment and no changes in medication dose or frequency. denies adverse side effects related to current therapy.  hx colon ca, s/p surg resection 2008 no bowel changes or diarrhea; no weight changes or hematocezia released by onc in 2011-   dyslipidemia- on simva - reports compliance with ongoing medical treatment and no changes in medication dose or frequency. denies adverse side effects related to current therapy. no muscle or GI problems  Past Medical History  Diagnosis Date  . CAD (coronary artery disease) 04/08/09    Native vessel s/p overlapoing Xience DES stent mid Circumflex  . Other specified forms of chronic ischemic heart disease   . Acute on chronic systolic heart failure   . COPD (chronic obstructive pulmonary disease) 05/2009  . Hyperlipidemia   . Colon cancer 2008    Colon  . Acute myocardial infarction, unspecified site, episode of care unspecified 10/2008    Review of Systems  Patient denies chest pain, edema or dyspnea on exertion. No headache, rash or abdominal pain.   Objective:   Physical Exam  BP 110/60  Pulse 68  Temp(Src) 97.7 F (36.5 C) (Oral)  Ht 5\' 4"  (1.626 m)  Wt 156 lb 6.4 oz (70.943 kg)  BMI 26.85 kg/m2  SpO2 93% General appearance: cooperative, appears stated age and no distress -family at side Lungs: diminished breath  sounds bilaterally and no whheze or crackle Heart: regular rate and rhythm and no rub Psyc: AAOx3. Not depressed or anxious appearing   Assessment & Plan:  See problem list. Medications and labs reviewed today.

## 2011-01-22 ENCOUNTER — Other Ambulatory Visit: Payer: Self-pay | Admitting: Internal Medicine

## 2011-04-15 ENCOUNTER — Encounter: Payer: Self-pay | Admitting: Internal Medicine

## 2011-04-15 LAB — BASIC METABOLIC PANEL
Calcium: 8.6
Creatinine, Ser: 1.27
GFR calc Af Amer: >60
GFR calc non Af Amer: 55 — ABNORMAL LOW
Glucose, Bld: 176 — ABNORMAL HIGH
Sodium: 139

## 2011-04-15 LAB — URINALYSIS, ROUTINE W REFLEX MICROSCOPIC
Hgb urine dipstick: NEGATIVE
Nitrite: NEGATIVE
Specific Gravity, Urine: 1.017
Urobilinogen, UA: 1

## 2011-04-15 LAB — COMPREHENSIVE METABOLIC PANEL
ALT: 18
AST: 25
Albumin: 3.2 — ABNORMAL LOW
Calcium: 9.4
GFR calc Af Amer: 52 — ABNORMAL LOW
Sodium: 145
Total Protein: 6.4

## 2011-04-15 LAB — DIFFERENTIAL
Eosinophils Relative: 3
Lymphocytes Relative: 53 — ABNORMAL HIGH
Lymphs Abs: 3
Monocytes Relative: 11
Neutro Abs: 1.8

## 2011-04-15 LAB — CBC
Hemoglobin: 14.2
MCHC: 33.1
MCHC: 34.4
Platelets: 134 — ABNORMAL LOW
RBC: 4.72
RDW: 13.5
RDW: 13.9

## 2011-04-15 LAB — HEMOGLOBIN A1C: Hgb A1c MFr Bld: 5.8

## 2011-04-15 LAB — TYPE AND SCREEN
ABO/RH(D): A POS
Antibody Screen: NEGATIVE

## 2011-04-15 LAB — ABO/RH: ABO/RH(D): A POS

## 2011-04-15 LAB — PROTIME-INR: INR: 1

## 2011-04-25 ENCOUNTER — Other Ambulatory Visit: Payer: Self-pay | Admitting: Internal Medicine

## 2011-05-12 ENCOUNTER — Other Ambulatory Visit: Payer: Self-pay

## 2011-05-12 MED ORDER — SIMVASTATIN 40 MG PO TABS: 40.0000 mg | ORAL_TABLET | Freq: Every day | ORAL | Status: DC

## 2011-05-12 MED ORDER — POTASSIUM CHLORIDE CRYS ER 20 MEQ PO TBCR: 20.0000 meq | EXTENDED_RELEASE_TABLET | Freq: Every day | ORAL | Status: DC

## 2011-05-19 ENCOUNTER — Other Ambulatory Visit (INDEPENDENT_AMBULATORY_CARE_PROVIDER_SITE_OTHER): Payer: Medicare Other

## 2011-05-19 ENCOUNTER — Ambulatory Visit (INDEPENDENT_AMBULATORY_CARE_PROVIDER_SITE_OTHER): Payer: Medicare Other | Admitting: Internal Medicine

## 2011-05-19 ENCOUNTER — Encounter: Payer: Self-pay | Admitting: Internal Medicine

## 2011-05-19 VITALS — BP 130/60 | HR 73 | Temp 97.9°F | Wt 163.4 lb

## 2011-05-19 DIAGNOSIS — L608 Other nail disorders: Secondary | ICD-10-CM

## 2011-05-19 DIAGNOSIS — E785 Hyperlipidemia, unspecified: Secondary | ICD-10-CM

## 2011-05-19 DIAGNOSIS — L602 Onychogryphosis: Secondary | ICD-10-CM

## 2011-05-19 DIAGNOSIS — J449 Chronic obstructive pulmonary disease, unspecified: Secondary | ICD-10-CM

## 2011-05-19 NOTE — Patient Instructions (Signed)
Good to see you today. Test(s) ordered today. Your results will be called to you after review (48-72hours after test completion). If any changes need to be made, you will be notified at that time. we'll make referral to podiatry. Our office will contact you regarding appointment(s) once made. Medications reviewed, no changes at this time. Please schedule followup in 6 months, call sooner if problems.

## 2011-05-19 NOTE — Assessment & Plan Note (Signed)
On simva - last lipids at goal The current medical regimen is effective;  continue present plan and medications. Check annually  

## 2011-05-19 NOTE — Progress Notes (Signed)
  Subjective:    Patient ID: Aaron [name], male    DOB: [birth year], 77 y.o.   MRN: [id number]  HPI  here for follow up  wife and dtr asst with hx as needed   Complains of long toenails. No toe pain or skin wounds. Unable to attend to his own nail care  COPD -  breathing is baseline today hosp fall 2010 for exac -  on Spiriva without need for rescue MDI use chronic mild cough controlled with tessalon; no shortness of breath, dyspnea on exertion or paroxysmal nocturnal dyspnea - reports compliance with ongoing medical treatment and no changes in medication dose or frequency. denies adverse side effects related to current therapy.   NSTEMI 04/2009 with CAD and Isch CM (chronic HF)- denies chest pain or angina; mild chronic periph edema - reports compliance with ongoing medical treatment and no changes in medication dose or frequency. denies adverse side effects related to current therapy.  hx colon ca, hx surg resection 2008 no bowel changes or diarrhea; no weight changes or hematocezia released by onc in 2011-   dyslipidemia- on simva - reports compliance with ongoing medical treatment and no changes in medication dose or frequency. denies adverse side effects related to current therapy. no muscle or GI problems  Past Medical History  Diagnosis Date  . CAD (coronary artery disease) 04/08/09    Native vessel s/p overlapoing Xience DES stent mid Circumflex  . Other specified forms of chronic ischemic heart disease   . Acute on chronic systolic heart failure   . COPD (chronic obstructive pulmonary disease) 05/2009  . Hyperlipidemia   . Colon cancer 2008    Colon  . Acute myocardial infarction, unspecified site, episode of care unspecified 10/2008    Review of Systems  Patient denies chest pain, edema or dyspnea on exertion. No headache, rash or abdominal pain.   Objective:   Physical Exam  BP 130/60  Pulse 73  Temp(Src) 97.9 F (36.6 C) (Oral)  Wt 163 lb 6.4 oz (74.118  kg)  SpO2 93% Wt Readings from Last 3 Encounters:  05/19/11 163 lb 6.4 oz (74.118 kg)  01/20/11 156 lb 6.4 oz (70.943 kg)  11/06/10 122 lb (55.339 kg)   General appearance: cooperative, appears stated age and no distress -family at side Lungs: diminished breath sounds bilaterally and no whheze or crackle Heart: regular rate and rhythm and no rub No BLE edema, mild varicose vein changes Psyc: AAOx3. Not depressed or anxious appearing Skin: B feet all toenails overgrown with fungal changes   Assessment & Plan:  See problem list. Medications and labs reviewed today.

## 2011-05-19 NOTE — Assessment & Plan Note (Signed)
Improved nocturnal pseudo wheeze on scheduled benzonate and H2B for acid suppression; continue same Reviewed pulm meds, no other changes

## 2011-05-20 LAB — LIPID PANEL
Cholesterol: 130 mg/dL (ref 0–200)
HDL: 41.6 mg/dL (ref >39.00)
Total CHOL/HDL Ratio: 3
Triglycerides: 64 mg/dL (ref 0.0–149.0)

## 2011-07-09 ENCOUNTER — Encounter: Payer: Self-pay | Admitting: Internal Medicine

## 2011-08-12 ENCOUNTER — Telehealth: Payer: Self-pay

## 2011-08-12 NOTE — Telephone Encounter (Signed)
Pt's daughter-in-law stating pt has been suffering with a mild cold x 1 week but he was found on the floor this morning, calling for help. Pt was extremely disoriented and c/o HA, dizziness, loss of balance/coordination and stating he "lost all his feeling". Daughter-in-law advised to take pt to local ER for Neuro eval ASAP. She agreed and will have MD notes sent to [facility] for review as well.

## 2011-08-12 NOTE — Telephone Encounter (Signed)
Agree as advised thanks 

## 2011-08-24 ENCOUNTER — Other Ambulatory Visit: Payer: Self-pay | Admitting: Internal Medicine

## 2011-09-24 ENCOUNTER — Other Ambulatory Visit: Payer: Self-pay | Admitting: Cardiovascular Disease

## 2011-09-24 MED ORDER — POTASSIUM CHLORIDE CRYS ER 20 MEQ PO TBCR: 20.0000 meq | EXTENDED_RELEASE_TABLET | Freq: Every day | ORAL | Status: DC

## 2011-10-01 ENCOUNTER — Other Ambulatory Visit: Payer: Self-pay | Admitting: Internal Medicine

## 2011-11-16 ENCOUNTER — Other Ambulatory Visit (INDEPENDENT_AMBULATORY_CARE_PROVIDER_SITE_OTHER): Payer: Medicare Other

## 2011-11-16 ENCOUNTER — Encounter: Payer: Self-pay | Admitting: Internal Medicine

## 2011-11-16 ENCOUNTER — Ambulatory Visit (INDEPENDENT_AMBULATORY_CARE_PROVIDER_SITE_OTHER): Payer: Medicare Other | Admitting: Internal Medicine

## 2011-11-16 VITALS — BP 116/62 | HR 76 | Temp 97.6°F | Wt 158.0 lb

## 2011-11-16 DIAGNOSIS — I2510 Atherosclerotic heart disease of native coronary artery without angina pectoris: Secondary | ICD-10-CM

## 2011-11-16 DIAGNOSIS — R5381 Other malaise: Secondary | ICD-10-CM

## 2011-11-16 DIAGNOSIS — E785 Hyperlipidemia, unspecified: Secondary | ICD-10-CM

## 2011-11-16 DIAGNOSIS — J4489 Other specified chronic obstructive pulmonary disease: Secondary | ICD-10-CM

## 2011-11-16 DIAGNOSIS — R5383 Other fatigue: Secondary | ICD-10-CM

## 2011-11-16 DIAGNOSIS — J449 Chronic obstructive pulmonary disease, unspecified: Secondary | ICD-10-CM

## 2011-11-16 LAB — CBC WITH DIFFERENTIAL/PLATELET
Basophils Absolute: 0 10*3/uL (ref 0.0–0.1)
Eosinophils Relative: 6.3 % — ABNORMAL HIGH (ref 0.0–5.0)
Lymphocytes Relative: 38.2 % (ref 12.0–46.0)
Lymphs Abs: 2.7 10*3/uL (ref 0.7–4.0)
Monocytes Relative: 12.9 % — ABNORMAL HIGH (ref 3.0–12.0)
Neutrophils Relative %: 42 % — ABNORMAL LOW (ref 43.0–77.0)
Platelets: 99 10*3/uL — ABNORMAL LOW (ref 150.0–400.0)
RDW: 14.4 % (ref 11.5–14.6)
WBC: 7.1 10*3/uL (ref 4.5–10.5)

## 2011-11-16 LAB — HEPATIC FUNCTION PANEL
ALT: 15 U/L (ref 0–53)
AST: 18 U/L (ref 0–37)
Alkaline Phosphatase: 103 U/L (ref 39–117)
Bilirubin, Direct: 0.1 mg/dL (ref 0.0–0.3)
Total Bilirubin: 0.5 mg/dL (ref 0.3–1.2)
Total Protein: 6.6 g/dL (ref 6.0–8.3)

## 2011-11-16 LAB — BASIC METABOLIC PANEL
BUN: 24 mg/dL — ABNORMAL HIGH (ref 6–23)
Calcium: 8.5 mg/dL (ref 8.4–10.5)
Creatinine, Ser: 1.4 mg/dL (ref 0.4–1.5)
GFR: 50.84 mL/min — ABNORMAL LOW (ref >60.00)
Glucose, Bld: 90 mg/dL (ref 70–99)
Potassium: 4.6 mEq/L (ref 3.5–5.1)

## 2011-11-16 NOTE — Patient Instructions (Signed)
Good to see you today. Test(s) ordered today. Your results will be called to you after review (48-72hours after test completion). If any changes need to be made, you will be notified at that time. Medications reviewed, no changes at this time. Please schedule followup in 6 months, call sooner if problems.

## 2011-11-16 NOTE — Assessment & Plan Note (Signed)
On simva - last lipids at goal The current medical regimen is effective;  continue present plan and medications. Check annually  

## 2011-11-16 NOTE — Assessment & Plan Note (Signed)
MI 04/2009 -Follows with cards every 6-53mo no active anginal symptoms - cont same meds

## 2011-11-16 NOTE — Assessment & Plan Note (Signed)
Improved nocturnal pseudo wheeze on scheduled benzonate and H2B for acid suppression; continue same Reviewed pulm meds, no other changes  

## 2011-11-16 NOTE — Progress Notes (Signed)
  Subjective:    Patient ID: Aaron [name], male    DOB: 1925-03-02, 78 y.o.   MRN: [id number]  HPI  here for follow up  wife and dtr asst with hx as needed   nearsync event 08/2011 in [place] in setting of head cold - local ER eval "checked out ok" and DC home from ER  Also reviewed chronic medical issues:  COPD -  breathing is baseline today hosp fall 2010 for exac -  on Spiriva without need for rescue MDI use chronic mild cough controlled with tessalon; no shortness of breath, dyspnea on exertion or paroxysmal nocturnal dyspnea - reports compliance with ongoing medical treatment and no changes in medication dose or frequency. denies adverse side effects related to current therapy.   NSTEMI 04/2009 with CAD and Isch CM (chronic HF)- denies chest pain or angina; mild chronic periph edema - reports compliance with ongoing medical treatment and no changes in medication dose or frequency. denies adverse side effects related to current therapy.  hx colon ca, hx surg resection 2008 no bowel changes or diarrhea; no weight changes or hematocezia released by onc in 2011-   dyslipidemia- on simva - reports compliance with ongoing medical treatment and no changes in medication dose or frequency. denies adverse side effects related to current therapy. no muscle or GI problems  Past Medical History  Diagnosis Date  . CAD (coronary artery disease) 04/08/09    Native vessel s/p overlapoing Xience DES stent mid Circumflex  . Other specified forms of chronic ischemic heart disease   . Acute on chronic systolic heart failure   . COPD (chronic obstructive pulmonary disease) 05/2009  . Hyperlipidemia   . Colon cancer 2008    Colon  . Acute myocardial infarction, unspecified site, episode of care unspecified 10/2008    Review of Systems  Constitutional: Positive for fatigue.  Respiratory: Negative for cough.   Cardiovascular: Negative for chest pain and palpitations.    Objective:   Physical  Exam  BP 116/62  Pulse 76  Temp(Src) 97.6 F (36.4 C) (Oral)  Wt 158 lb (71.668 kg)  SpO2 93% Wt Readings from Last 3 Encounters:  11/16/11 158 lb (71.668 kg)  05/19/11 163 lb 6.4 oz (74.118 kg)  01/20/11 156 lb 6.4 oz (70.943 kg)   General appearance: cooperative, appears stated age and no distress -wife and dtr at side Lungs: diminished breath sounds bilaterally and no whheze or crackle Heart: regular rate and rhythm and no rub trace ankle BLE edema, mild varicose vein changes Psyc: AAOx3. Not depressed or anxious appearing  Lab Results  Component Value Date   WBC 5.6 06/07/2009   HGB 14.1 06/07/2009   HCT 41.5 06/07/2009   PLT 98* 06/07/2009   GLUCOSE 86 07/10/2009   CHOL 130 05/19/2011   TRIG 64.0 05/19/2011   HDL 41.60 05/19/2011   LDLCALC 76 05/19/2011   ALT 15 10/01/2009   AST 22 10/01/2009   NA 140 07/10/2009   K 4.6 10/01/2009   CL 105 07/10/2009   CREATININE 1.5 10/01/2009   BUN 24* 07/10/2009   CO2 28 07/10/2009   TSH 1.600 *Test methodology is 3rd generation TSH** 05/20/2009   INR 1.1 04/08/2009   HGBA1C  Value: 5.8  05/03/2007     Assessment & Plan:  See problem list. Medications and labs reviewed today.  Fatigue - nonspecific - check labs

## 2011-11-19 ENCOUNTER — Other Ambulatory Visit: Payer: Self-pay | Admitting: Cardiovascular Disease

## 2011-11-19 MED ORDER — FUROSEMIDE 20 MG PO TABS: 20.0000 mg | ORAL_TABLET | Freq: Every day | ORAL | Status: DC

## 2011-12-17 ENCOUNTER — Other Ambulatory Visit: Payer: Self-pay | Admitting: *Deleted

## 2011-12-17 MED ORDER — FUROSEMIDE 20 MG PO TABS: 20.0000 mg | ORAL_TABLET | Freq: Every day | ORAL | Status: DC

## 2012-01-19 ENCOUNTER — Other Ambulatory Visit: Payer: Self-pay | Admitting: Cardiovascular Disease

## 2012-01-20 ENCOUNTER — Other Ambulatory Visit: Payer: Self-pay | Admitting: *Deleted

## 2012-01-20 MED ORDER — POTASSIUM CHLORIDE CRYS ER 20 MEQ PO TBCR: 20.0000 meq | EXTENDED_RELEASE_TABLET | Freq: Every day | ORAL | Status: DC

## 2012-02-09 ENCOUNTER — Other Ambulatory Visit: Payer: Self-pay

## 2012-02-09 MED ORDER — SIMVASTATIN 40 MG PO TABS: 40.0000 mg | ORAL_TABLET | Freq: Every day | ORAL | Status: DC

## 2012-03-15 ENCOUNTER — Other Ambulatory Visit: Payer: Self-pay | Admitting: Internal Medicine

## 2012-04-01 ENCOUNTER — Other Ambulatory Visit: Payer: Self-pay

## 2012-04-01 MED ORDER — FUROSEMIDE 20 MG PO TABS: 20.0000 mg | ORAL_TABLET | Freq: Every day | ORAL | Status: DC

## 2012-04-14 ENCOUNTER — Other Ambulatory Visit: Payer: Self-pay | Admitting: Internal Medicine

## 2012-05-16 ENCOUNTER — Encounter: Payer: Self-pay | Admitting: Internal Medicine

## 2012-05-16 ENCOUNTER — Other Ambulatory Visit (INDEPENDENT_AMBULATORY_CARE_PROVIDER_SITE_OTHER): Payer: Medicare Other

## 2012-05-16 ENCOUNTER — Ambulatory Visit (INDEPENDENT_AMBULATORY_CARE_PROVIDER_SITE_OTHER): Payer: Medicare Other | Admitting: Internal Medicine

## 2012-05-16 VITALS — BP 130/60 | HR 74 | Temp 97.7°F | Ht 64.0 in | Wt 162.6 lb

## 2012-05-16 DIAGNOSIS — E785 Hyperlipidemia, unspecified: Secondary | ICD-10-CM

## 2012-05-16 DIAGNOSIS — J4489 Other specified chronic obstructive pulmonary disease: Secondary | ICD-10-CM

## 2012-05-16 DIAGNOSIS — J449 Chronic obstructive pulmonary disease, unspecified: Secondary | ICD-10-CM

## 2012-05-16 LAB — LIPID PANEL
Cholesterol: 130 mg/dL (ref 0–200)
LDL Cholesterol: 73 mg/dL (ref 0–99)
Triglycerides: 35 mg/dL (ref 0.0–149.0)
VLDL: 7 mg/dL (ref 0.0–40.0)

## 2012-05-16 NOTE — Progress Notes (Signed)
Subjective:    Patient ID: Aaron [name], male    DOB: 15-Feb-1925, 78 y.o.   MRN: [id number]  HPI  here for follow up  wife and dtr asst with hx as needed   nearsync event 08/2011 in [place] in setting of head cold - local ER eval "checked out ok" and DC home from ER  Also reviewed chronic medical issues:  COPD -  breathing is baseline today hosp fall 2010 for exac -  on Spiriva without need for rescue MDI use chronic mild cough controlled with tessalon; no shortness of breath, dyspnea on exertion or paroxysmal nocturnal dyspnea - reports compliance with ongoing medical treatment and no changes in medication dose or frequency. denies adverse side effects related to current therapy.   NSTEMI 04/2009 with CAD and Isch CM (chronic HF)- denies chest pain or angina; mild chronic periph edema - reports compliance with ongoing medical treatment and no changes in medication dose or frequency. denies adverse side effects related to current therapy.  hx colon ca, hx surg resection 2008 no bowel changes or diarrhea; no weight changes or hematocezia released by onc in 2011-   dyslipidemia- on simva - reports compliance with ongoing medical treatment and no changes in medication dose or frequency. denies adverse side effects related to current therapy. no muscle or GI problems  Past Medical History  Diagnosis Date  . CAD (coronary artery disease) 04/08/09    Native vessel s/p overlapoing Xience DES stent mid Circumflex  . Other specified forms of chronic ischemic heart disease   . Acute on chronic systolic heart failure   . COPD (chronic obstructive pulmonary disease) 05/2009  . Hyperlipidemia   . Colon cancer 2008    Colon  . Acute myocardial infarction, unspecified site, episode of care unspecified 10/2008    Review of Systems  Constitutional: Positive for fatigue. Negative for unexpected weight change.  Respiratory: Negative for cough, shortness of breath and wheezing.     Cardiovascular: Negative for chest pain and palpitations.    Objective:   Physical Exam  BP 130/60  Pulse 74  Temp 97.7 F (36.5 C) (Oral)  Ht 5\' 4"  (1.626 m)  Wt 162 lb 9.6 oz (73.755 kg)  BMI 27.91 kg/m2  SpO2 93% Wt Readings from Last 3 Encounters:  05/16/12 162 lb 9.6 oz (73.755 kg)  11/16/11 158 lb (71.668 kg)  05/19/11 163 lb 6.4 oz (74.118 kg)   Constitutional:  He appears well-developed and well-nourished. No distress. Wife and dtr at side - HOH without change Neck: Normal range of motion. Neck supple. No JVD present. No thyromegaly present.  Cardiovascular: Normal rate, regular rhythm and normal heart sounds.  No murmur heard. trace R>L edema, no change Pulmonary/Chest: Effort normal - few rhonchi with sligt end exp wheeze L base. No respiratory distress.  Neurological: he is alert and oriented to person, place, and time. No cranial nerve deficit. Coordination normal.  Skin: Skin is warm and dry.  No erythema or ulceration.  Psychiatric: he has a normal mood and affect. behavior is normal. Judgment and thought content normal.     Lab Results  Component Value Date   WBC 7.1 11/16/2011   HGB 14.6 11/16/2011   HCT 44.4 11/16/2011   PLT 99.0* 11/16/2011   GLUCOSE 90 11/16/2011   CHOL 130 05/19/2011   TRIG 64.0 05/19/2011   HDL 41.60 05/19/2011   LDLCALC 76 05/19/2011   ALT 15 11/16/2011   AST 18 11/16/2011   NA 140 11/16/2011  K 4.6 11/16/2011   CL 102 11/16/2011   CREATININE 1.4 11/16/2011   BUN 24* 11/16/2011   CO2 27 11/16/2011   TSH 1.99 11/16/2011   INR 1.1 04/08/2009   HGBA1C  Value: 5.8 (NOTE)   The ADA recommends the following therapeutic goals for glycemic   control related to Hgb A1C measurement:   Goal of Therapy:   < 7.0% Hgb A1C   Action Suggested:  > 8.0% Hgb A1C   Ref:  Diabetes Care, 22, Suppl. 1, 1999 05/03/2007      Assessment & Plan:  See problem list. Medications and labs reviewed today.

## 2012-05-16 NOTE — Assessment & Plan Note (Signed)
Initially improved nocturnal pseudo wheeze on scheduled benzonate and H2B for acid suppression; continue same Encouraged compliance with daily spiriva and prn albuterol Reviewed pulm meds, no other changes

## 2012-05-16 NOTE — Patient Instructions (Addendum)
Good to see you today. Test(s) ordered today. Your results will be released to MyChart (or called to you) after review, usually within 72hours after test completion. If any changes need to be made, you will be notified at that same time. Medications reviewed, no changes at this time. Take Spiriva every day and use rescue Albuterol inhaler as needed - ok to stop benzoante if not helping nighttime cough Please schedule followup in 6 months, call sooner if problems.

## 2012-05-16 NOTE — Assessment & Plan Note (Signed)
On simva - last lipids at goal The current medical regimen is effective;  continue present plan and medications. Check annually  

## 2012-05-25 ENCOUNTER — Other Ambulatory Visit: Payer: Self-pay | Admitting: *Deleted

## 2012-05-25 MED ORDER — POTASSIUM CHLORIDE CRYS ER 20 MEQ PO TBCR: 20.0000 meq | EXTENDED_RELEASE_TABLET | Freq: Every day | ORAL | Status: DC

## 2012-05-30 ENCOUNTER — Ambulatory Visit
Admission: RE | Admit: 2012-05-30 | Discharge: 2012-05-30 | Disposition: A | Discharge status: Home / Self Care | Payer: Medicare Other | Source: Ambulatory Visit | Attending: Physician Assistant | Admitting: Physician Assistant

## 2012-05-30 ENCOUNTER — Encounter: Payer: Self-pay | Admitting: Physician Assistant

## 2012-05-30 ENCOUNTER — Ambulatory Visit (INDEPENDENT_AMBULATORY_CARE_PROVIDER_SITE_OTHER): Payer: Medicare Other | Admitting: Physician Assistant

## 2012-05-30 VITALS — BP 129/72 | HR 75 | Ht 62.0 in | Wt 161.0 lb

## 2012-05-30 DIAGNOSIS — M25569 Pain in unspecified knee: Secondary | ICD-10-CM

## 2012-05-30 DIAGNOSIS — I2510 Atherosclerotic heart disease of native coronary artery without angina pectoris: Secondary | ICD-10-CM

## 2012-05-30 DIAGNOSIS — I219 Acute myocardial infarction, unspecified: Secondary | ICD-10-CM

## 2012-05-30 DIAGNOSIS — E785 Hyperlipidemia, unspecified: Secondary | ICD-10-CM

## 2012-05-30 DIAGNOSIS — I2589 Other forms of chronic ischemic heart disease: Secondary | ICD-10-CM

## 2012-05-30 MED ORDER — NAPROXEN 375 MG PO TABS: 375.0000 mg | ORAL_TABLET | Freq: Two times a day (BID) | ORAL | Status: DC

## 2012-05-30 MED ORDER — TRAMADOL HCL 50 MG PO TABS: 25.0000 mg | ORAL_TABLET | Freq: Two times a day (BID) | ORAL | Status: DC | PRN

## 2012-05-30 NOTE — Progress Notes (Signed)
Appt. has been scheduled with Dr. Felicity Coyer on 11-26 @ 10:45 am, pt's daughter aware.

## 2012-05-30 NOTE — Patient Instructions (Addendum)
**Note De-Identified  Obfuscation** Your physician has recommended you make the following change in your medication: take Naproxen 375 mg twice daily for 1 week, take Ultram 25 mg twice  daily as needed for knee pain, take OTC Tylenol 500 mg every 6 to 8 hours as needed and OTC Pepsid 20 mg twice daily for 1 to 2 weeks while taking Naproxen  Your provider recommends that you have a x-ray of your knee today  Your physician recommends that you schedule a follow-up appointment in: 6 months

## 2012-05-30 NOTE — Progress Notes (Signed)
[address] [place], [place]  [address] Phone: [phone], Fax:  [phone]  Date:  05/30/2012   Name:  HAMAD WHYTE   DOB:  05-16-1925   MRN:  [id number]  PCP:  Rene Paci, MD  Primary Cardiologist:  Dr. Verne Carrow  Primary Electrophysiologist:  None    History of Present Illness: Aaron [name] is a 78 y.o. male who returns today for follow up.  He has a hx of CAD, ICM, COPD, BPH and HL. Patient suffered a non-STEMI in 04/2009.  LHC 10/10:  p[LAD 25%, CFX subtotally occluded, proximal-mid RCA 25%. PCI: Xience DES x2 to the CFX. Echo 1/11: EF 30-35% with inferior, posterior and inferoseptal HK, grade 1 diastolic dysfunction, trivial AI, mild to moderate MR-posteriorly directed, likely due to tethering of the posterior leaflet, mild LAE, mild RAE, PASP 28. Last seen by Dr. Verne Carrow 5/12.  Unfortunately, there was some confusion about scheduling his appointment today. He was under the impression that he was coming in to be seen for acute right knee pain. The patient's wife is quite upset that he was set up to see his cardiologist for this. I had the clinical services manager speak to the patient and his wife regarding this issue.  He denies chest pain, shortness of breath, syncope, orthopnea, PND.  Several days ago, he got up from a chair and developed right knee pain. He now has some swelling in his knee. Flexion seems to cause more pain. He is able to bear weight.  Labs (5/13):    K 4.6, creatinine 1.4, ALT 15, Hgb 14.6, platelet 99K, TSH 1.99  Labs (11/13):  LDL 73   Wt Readings from Last 3 Encounters:  05/30/12 161 lb (73.029 kg)  05/16/12 162 lb 9.6 oz (73.755 kg)  11/16/11 158 lb (71.668 kg)     Past Medical History  Diagnosis Date  . CAD (coronary artery disease) 04/08/09    Native vessel s/p overlapoing Xience DES stent mid Circumflex  . Other specified forms of chronic ischemic heart disease   . Acute on  chronic systolic heart failure   . COPD (chronic obstructive pulmonary disease) 05/2009  . Hyperlipidemia   . Colon cancer 2008    Colon  . Acute myocardial infarction, unspecified site, episode of care unspecified 10/2008    Current Outpatient Prescriptions  Medication Sig Dispense Refill  . albuterol (VENTOLIN HFA) 108 (90 BASE) MCG/ACT inhaler Inhale into the lungs every 6 (six) hours as needed.        . Ascorbic Acid (VITAMIN C) 500 MG tablet Take 500 mg by mouth daily.        Marland Kitchen aspirin 325 MG EC tablet Take 325 mg by mouth daily.        . Cholecalciferol (VITAMIN D) 2000 UNIT CAPS Take by mouth daily.        . furosemide (LASIX) 20 MG tablet Take 1 tablet (20 mg total) by mouth daily.  30 tablet  2  . potassium chloride SA (K-DUR,KLOR-CON) 20 MEQ tablet Take 1 tablet (20 mEq total) by mouth daily.  30 tablet  1  . simvastatin (ZOCOR) 40 MG tablet Take 1 tablet (40 mg total) by mouth at bedtime.  30 tablet  6  . SPIRIVA HANDIHALER 18 MCG inhalation capsule USE 1 INHALATION BY MOUTH DAILY  30 capsule  1  . vitamin B-12 (CYANOCOBALAMIN) 100 MCG tablet Take 100 mcg by mouth daily.        Marland Kitchen  vitamin E 400 UNIT capsule Take 400 Units by mouth daily.        Marland Kitchen Spacer/Aero-Holding Chambers (AEROCHAMBER Z-STAT PLUS CHAMBR) MISC by Does not apply route. Use as directed with MDI        Allergies:  No Known Allergies  Social History:  The patient  reports that he has quit smoking. His smoking use included Cigarettes. He does not have any smokeless tobacco history on file. He reports that he does not drink alcohol or use illicit drugs.   ROS:  Please see the history of present illness.   All other systems reviewed and negative.   PHYSICAL EXAM: VS:  BP 129/72  Pulse 75  Ht 5\' 2"  (1.575 m)  Wt 161 lb (73.029 kg)  BMI 29.45 kg/m2 Well nourished, well developed, in no acute distress HEENT: normal Neck: no JVD Cardiac:  normal S1, S2; RRR; no murmur Lungs:  clear to auscultation bilaterally,  no wheezing, rhonchi or rales Abd: soft, nontender, no hepatomegaly Ext: trace RLE edema MSK:  Mild to mod effusion R knee; increased pain with passive extension, diffuse tenderness to palpation of knee, pain limits exam Skin: warm and dry Neuro:  CNs 2-12 intact, no focal abnormalities noted  EKG:  NSR, HR 82, LAD, TWI 2, 3, aVF, V6, no change from prior tracings      ASSESSMENT AND PLAN:  1. Coronary Artery Disease:   Stable. No angina. Continue aspirin and statin.   2. Ischemic Cardiomyopathy:   Volume stable. Continue current therapy.  3. Hyperlipidemia:   Recent LDL optimal. Continue current therapy.  4. Knee Pain:   Suspect he has developed some type of internal derangement. He is in quite a bit of pain. I given him a prescription for naproxen 375 mg twice daily. He should take this only for one week. I have asked him to get Pepcid 20 mg BID and take for 1-2 weeks while taking naproxen.  I have also recommended Tylenol. I have given him a prescription for Ultram 25 mg twice daily as needed for severe pain. I will set him up for right knee x-ray. We will also arrange early followup with his PCP for further evaluation and management.  SignedTereso Newcomer, PA-C  11:44 AM 05/30/2012

## 2012-05-31 ENCOUNTER — Ambulatory Visit (INDEPENDENT_AMBULATORY_CARE_PROVIDER_SITE_OTHER): Payer: Medicare Other | Admitting: Internal Medicine

## 2012-05-31 ENCOUNTER — Encounter: Payer: Self-pay | Admitting: Internal Medicine

## 2012-05-31 ENCOUNTER — Telehealth: Payer: Self-pay | Admitting: *Deleted

## 2012-05-31 VITALS — BP 116/62 | HR 94 | Temp 97.0°F | Resp 14 | Ht 62.0 in | Wt 159.8 lb

## 2012-05-31 DIAGNOSIS — M1710 Unilateral primary osteoarthritis, unspecified knee: Secondary | ICD-10-CM

## 2012-05-31 DIAGNOSIS — M25569 Pain in unspecified knee: Secondary | ICD-10-CM

## 2012-05-31 DIAGNOSIS — M25561 Pain in right knee: Secondary | ICD-10-CM

## 2012-05-31 NOTE — Telephone Encounter (Signed)
Message copied by Tarri Fuller on Tue May 31, 2012  8:44 AM ------      Message from: [place], [place] T      Created: Tue May 31, 2012  8:40 AM       Fluid in knee joint.      Arthritic changes.      Nothing acute evident on xray.      Keep follow up with PCP as planned.      Tereso Newcomer, PA-C  8:37 AM 05/31/2012

## 2012-05-31 NOTE — Telephone Encounter (Signed)
s/w pt's daughter not sure which on though. When I said about the arthritic changes she asked did this all come at once and I said no that he probably had arthritis already. Daughter is aware of knee xray results verbalized understanding.

## 2012-05-31 NOTE — Progress Notes (Signed)
Subjective:    Patient ID: Aaron [name], male    DOB: 10-09-1924, 78 y.o.   MRN: [id number]  Knee Pain  The incident occurred 2 days ago. The incident occurred at home. The injury mechanism was a twisting injury (standing from chair after dinner). The pain is present in the right knee. The quality of the pain is described as aching and stabbing. The pain is moderate. The pain has been constant since onset. Associated symptoms include a loss of motion. Pertinent negatives include no inability to bear weight or muscle weakness. He reports no foreign bodies present. The symptoms are aggravated by movement. He has tried acetaminophen for the symptoms. The treatment provided mild relief.    Past Medical History  Diagnosis Date  . CAD (coronary artery disease) 04/08/09    Native vessel s/p overlapoing Xience DES stent mid Circumflex  . Other specified forms of chronic ischemic heart disease   . Acute on chronic systolic heart failure   . COPD (chronic obstructive pulmonary disease) 05/2009  . Hyperlipidemia   . Colon cancer 2008    Colon  . Acute myocardial infarction, unspecified site, episode of care unspecified 10/2008     Review of Systems     Objective:   Physical Exam BP 116/62  Pulse 94  Temp 97 F (36.1 C) (Oral)  Resp 14  Ht 5\' 2"  (1.575 m)  Wt 159 lb 12 oz (72.462 kg)  BMI 29.22 kg/m2  SpO2 94% Wt Readings from Last 3 Encounters:  05/31/12 159 lb 12 oz (72.462 kg)  05/30/12 161 lb (73.029 kg)  05/16/12 162 lb 9.6 oz (73.755 kg)   Constitutional:  He appears well-developed and well-nourished. No distress. Wife/dtr at side Cardiovascular: Normal rate, regular rhythm and normal heart sounds.  No murmur heard. no BLE edema Pulmonary/Chest: Effort normal and breath sounds normal. No respiratory distress. no wheezes.  Musculoskeletal: R knee - boggy synovitis - tender to palpation over joint line; FROM and ligamentous function intact   Lab Results  Component Value Date    WBC 7.1 11/16/2011   HGB 14.6 11/16/2011   HCT 44.4 11/16/2011   PLT 99.0* 11/16/2011   GLUCOSE 90 11/16/2011   CHOL 130 05/16/2012   TRIG 35.0 05/16/2012   HDL 49.90 05/16/2012   LDLCALC 73 05/16/2012   ALT 15 11/16/2011   AST 18 11/16/2011   NA 140 11/16/2011   K 4.6 11/16/2011   CL 102 11/16/2011   CREATININE 1.4 11/16/2011   BUN 24* 11/16/2011   CO2 27 11/16/2011   TSH 1.99 11/16/2011   INR 1.1 04/08/2009   HGBA1C  Value: 5.8 (NOTE)   The ADA recommends the following therapeutic goals for glycemic   control related to Hgb A1C measurement:   Goal of Therapy:   < 7.0% Hgb A1C   Action Suggested:  > 8.0% Hgb A1C   Ref:  Diabetes Care, 22, Suppl. 1, 1999 05/03/2007   Dg Knee Complete 4 Views Right  05/30/2012  *RADIOLOGY REPORT*  Clinical Data: Right knee pain after twisting injury 3 weeks ago.  RIGHT KNEE - COMPLETE 4+ VIEW  Comparison: None  Findings: Mild medial compartmental degenerative articular space loss, without significant marginal spurring.  Questionable small knee effusion.  IMPRESSION:  1.  I suspect a small knee effusion, although positive predictive value is reduced by the degree of flexion on the lateral projection. 2.  Minimal degenerative loss of medial compartmental articular space.   Original Report Authenticated By: Gaylyn Rong,  M.D.     Procedure Note: knee intra-articular injection, right the patient elects to proceed after verbal consent is obtained. the patient informed of possible risks and complications prior to procedure. Using sterile technique throughout, patient is injected with 1:3 depomedrol:lidocaine inferior medially into knee. the patient tolerated the procedure well. Ice 24-48h, heat thereafter as needed instructions aftercare provided.       Assessment & Plan:   R knee pain with small effusion - osteoarthritis flare following mild twist injury 48h ago Xray yesterday reviewed - no fracture  Injection today - improved Aftercare instructions provided  - ice packs, tylenol bid x 72h, then prn -  pt/family understand and will call if worse or unimproved

## 2012-05-31 NOTE — Patient Instructions (Signed)
It was good to see you today. Knee Injection Joint injections are shots. Your caregiver will place a needle into your knee joint. The needle is used to put medicine into the joint. These shots can be used to help treat different painful knee conditions such as osteoarthritis, bursitis, local flare-ups of rheumatoid arthritis, and pseudogout. Anti-inflammatory medicines such as corticosteroids and anesthetics are the most common medicines used for joint and soft tissue injections.   PROCEDURE  The skin over the kneecap will be cleaned with an antiseptic solution.   Your caregiver will inject a small amount of a local anesthetic (a medicine like Novocaine) just under the skin in the area that was cleaned.   After the area becomes numb, a second injection is done. This second injection usually includes an anesthetic and an anti-inflammatory medicine called a steroid or cortisone. The needle is carefully placed in between the kneecap and the knee, and the medicine is injected into the joint space.   After the injection is done, the needle is removed. Your caregiver may place a bandage over the injection site. The whole procedure takes no more than a couple of minutes.  BEFORE THE PROCEDURE   Wash all of the skin around the entire knee area. Try to remove any loose, scaling skin. There is no other specific preparation necessary unless advised otherwise by your caregiver. LET YOUR CAREGIVER KNOW ABOUT:    Allergies.   Medications taken including herbs, eye drops, over the counter medications, and creams.   Use of steroids (by mouth or creams).   Possible pregnancy, if applicable.   Previous problems with anesthetics or Novocaine.   History of blood clots (thrombophlebitis).   History of bleeding or blood problems.   Previous surgery.   Other health problems.  RISKS AND COMPLICATIONS Side effects from cortisone shots are rare. They include:    Slight bruising of the skin.   Shrinkage  of the normal fatty tissue under the skin where the shot was given.   Increase in pain after the shot.   Infection.   Weakening of tendons or tendon rupture.   Allergic reaction to the medicine.   Diabetics may have a temporary increase in their blood sugar after a shot.   Cortisone can temporarily weaken the immune system. While receiving these shots, you should not get certain vaccines. Also, avoid contact with anyone who has chickenpox or measles. Especially if you have never had these diseases or have not been previously immunized. Your immune system may not be strong enough to fight off the infection while the cortisone is in your system.  AFTER THE PROCEDURE    You can go home after the procedure.   You may need to put ice on the joint 15 to 20 minutes every 3 or 4 hours until the pain goes away.   You may need to put an elastic bandage on the joint.  HOME CARE INSTRUCTIONS    Only take over-the-counter or prescription medicines for pain, discomfort, or fever as directed by your caregiver.   You should avoid stressing the joint. Unless advised otherwise, avoid activities that put a lot of pressure on a knee joint, such as:   Jogging.   Bicycling.   Recreational climbing.   Hiking.   Laying down and elevating the leg/knee above the level of your heart can help to minimize swelling.  SEEK MEDICAL CARE IF:    You have repeated or worsening swelling.   There is drainage from the  puncture area.   You develop red streaking that extends above or below the site where the needle was inserted.  SEEK IMMEDIATE MEDICAL CARE IF:    You develop a fever.   You have pain that gets worse even though you are taking pain medicine.   The area is red and warm, and you have trouble moving the joint.  MAKE SURE YOU:    Understand these instructions.   Will watch your condition.   Will get help right away if you are not doing well or get worse.  Document Released: 09/13/2006  Document Revised: 09/14/2011 Document Reviewed: 06/10/2007 [facility] Patient Information 2013 [place], [place].

## 2012-06-20 ENCOUNTER — Other Ambulatory Visit: Payer: Self-pay | Admitting: Internal Medicine

## 2012-06-28 ENCOUNTER — Other Ambulatory Visit: Payer: Self-pay | Admitting: Cardiology

## 2012-06-28 MED ORDER — FUROSEMIDE 20 MG PO TABS: 20.0000 mg | ORAL_TABLET | Freq: Every day | ORAL | Status: DC

## 2012-07-12 ENCOUNTER — Ambulatory Visit: Payer: Medicare Other | Admitting: Cardiovascular Disease

## 2012-07-16 ENCOUNTER — Other Ambulatory Visit: Payer: Self-pay | Admitting: Cardiovascular Disease

## 2012-08-19 ENCOUNTER — Other Ambulatory Visit: Payer: Self-pay | Admitting: Cardiovascular Disease

## 2012-08-23 ENCOUNTER — Other Ambulatory Visit: Payer: Self-pay

## 2012-09-21 ENCOUNTER — Other Ambulatory Visit: Payer: Self-pay | Admitting: *Deleted

## 2012-09-21 MED ORDER — POTASSIUM CHLORIDE CRYS ER 20 MEQ PO TBCR: EXTENDED_RELEASE_TABLET | ORAL | Status: DC

## 2012-09-21 NOTE — Telephone Encounter (Signed)
Patient 's daughter calling to refill Potassium Chloride once daily by mouth to [facility] on [address]   [phone], [address]   Micki Riley, CMA

## 2012-09-27 ENCOUNTER — Other Ambulatory Visit: Payer: Self-pay | Admitting: Cardiovascular Disease

## 2012-10-11 ENCOUNTER — Other Ambulatory Visit: Payer: Self-pay | Admitting: Cardiology

## 2012-10-11 MED ORDER — SIMVASTATIN 40 MG PO TABS: 40.0000 mg | ORAL_TABLET | Freq: Every day | ORAL | Status: DC

## 2012-10-28 ENCOUNTER — Other Ambulatory Visit: Payer: Self-pay | Admitting: *Deleted

## 2012-10-28 MED ORDER — FUROSEMIDE 20 MG PO TABS: ORAL_TABLET | ORAL | Status: DC

## 2012-11-02 ENCOUNTER — Other Ambulatory Visit: Payer: Self-pay | Admitting: Internal Medicine

## 2012-11-14 ENCOUNTER — Encounter: Payer: Self-pay | Admitting: Internal Medicine

## 2012-11-14 ENCOUNTER — Ambulatory Visit (INDEPENDENT_AMBULATORY_CARE_PROVIDER_SITE_OTHER): Payer: Medicare Other | Admitting: Internal Medicine

## 2012-11-14 VITALS — BP 110/58 | HR 66 | Temp 98.6°F | Wt 155.1 lb

## 2012-11-14 DIAGNOSIS — I2510 Atherosclerotic heart disease of native coronary artery without angina pectoris: Secondary | ICD-10-CM

## 2012-11-14 DIAGNOSIS — E785 Hyperlipidemia, unspecified: Secondary | ICD-10-CM

## 2012-11-14 DIAGNOSIS — J449 Chronic obstructive pulmonary disease, unspecified: Secondary | ICD-10-CM

## 2012-11-14 NOTE — Progress Notes (Signed)
Subjective:    Patient ID: Aaron [name], male    DOB: [birth year], 79 y.o.   MRN: [id number]  HPI  79 y.o. Male presents to the office today for a 6 month f/u of chronic medical conditions.  He is here with his daughter and wife who have assisted with his history as the patient is hearing impaired.  CAD:  Patient reports that he has been doing well.  He denies chest pain, jaw pain, light headedness.  COPD:  Patient's wife and daughter report that his breathing has been doing very well.  He is compliant with his medications.  They report no wheezing, cough, or sputum production at this time.  OA:  Patient's knee pain has clinically resolved after injection given in the office 05/30/13.  Patient reports that his knees feel "good".    Hyperlipidemia:  Patient has been doing well with simvistatin.  He denies myalgias or other ADRs.  Wife and daughter report compliance with the medication.  Past Medical History  Diagnosis Date  . CAD (coronary artery disease) 04/08/09    Native vessel s/p overlapoing Xience DES stent mid Circumflex  . Other specified forms of chronic ischemic heart disease   . Acute on chronic systolic heart failure   . COPD (chronic obstructive pulmonary disease) 05/2009  . Hyperlipidemia   . Colon cancer 2008    Colon  . Acute myocardial infarction, unspecified site, episode of care unspecified 10/2008   No family history on file.    Review of Systems  HENT: Positive for hearing loss. Negative for congestion and postnasal drip.        Patient uses hearing aids.  He has near total hearing loss.  Respiratory: Negative for cough, chest tightness, shortness of breath and wheezing.   Cardiovascular: Positive for leg swelling. Negative for chest pain and palpitations.  Gastrointestinal: Negative for nausea, vomiting, abdominal pain, diarrhea, blood in stool and rectal pain.  Musculoskeletal: Negative for myalgias and gait problem.  Neurological: Negative for dizziness,  tremors, syncope, speech difficulty, weakness and light-headedness.  All other systems reviewed and are negative.       Objective:   Physical Exam  Nursing note and vitals reviewed. Constitutional: He is oriented to person, place, and time. He appears well-developed and well-nourished.  HENT:  Head: Normocephalic and atraumatic.  Hearing diminished with hearing aids.  Eyes: Conjunctivae are normal. No scleral icterus.  Neck: Normal range of motion. Neck supple. No thyromegaly present.  Cardiovascular: Normal rate, regular rhythm and normal heart sounds.   1+ pretibial pitting edema to the knees  Pulmonary/Chest: Effort normal and breath sounds normal. No respiratory distress. He has no wheezes. He has no rales. He exhibits no tenderness.  Lymphadenopathy:    He has no cervical adenopathy.  Neurological: He is alert and oriented to person, place, and time.  Skin: Skin is warm and dry.  Psychiatric: He has a normal mood and affect. His behavior is normal.   Filed Vitals:   11/14/12 1046  BP: 110/58  Pulse: 66  Temp: 98.6 F (37 C)  TempSrc: Oral  Weight: 155 lb 1.9 oz (70.362 kg)  SpO2: 93%   Wt Readings from Last 3 Encounters:  11/14/12 155 lb 1.9 oz (70.362 kg)  05/31/12 159 lb 12 oz (72.462 kg)  05/30/12 161 lb (73.029 kg)   BP Readings from Last 3 Encounters:  11/14/12 110/58  05/31/12 116/62  05/30/12 129/72   Lab Results  Component Value Date   WBC 7.1  11/16/2011   HGB 14.6 11/16/2011   HCT 44.4 11/16/2011   PLT 99.0* 11/16/2011   GLUCOSE 90 11/16/2011   CHOL 130 05/16/2012   TRIG 35.0 05/16/2012   HDL 49.90 05/16/2012   LDLCALC 73 05/16/2012   ALT 15 11/16/2011   AST 18 11/16/2011   NA 140 11/16/2011   K 4.6 11/16/2011   CL 102 11/16/2011   CREATININE 1.4 11/16/2011   BUN 24* 11/16/2011   CO2 27 11/16/2011   TSH 1.99 11/16/2011   INR 1.1 04/08/2009   HGBA1C  Value: 5.8 (NOTE)   The ADA recommends the following therapeutic goals for glycemic   control related to  Hgb A1C measurement:   Goal of Therapy:   < 7.0% Hgb A1C   Action Suggested:  > 8.0% Hgb A1C   Ref:  Diabetes Care, 22, Suppl. 1, 1999 05/03/2007        Assessment & Plan:  79 y.o male presents to the office for 6 mo. F/u of chronic conditions.  Past medical history and interval events were reviewed.    1.  CAD:  Patient reports that he is doing well at this time.  Patient currently being treated with anti-lipid and aspirin therapy.  No changes are recommended at this time.    2.  COPD:  Patient doing well at this time.  Current therapy seems to be effective.  No changes in therapy recommended at this time.  3.  OA:  Patient currently asymptomatic at this time.  Patient instructed that use of OTC Aleve was okay if symptoms flared.  4.  Hyperlipidemia:  No current changes recommended at this.  Will recheck lipid panel in 6 months.  Patient to return to the office in 6 months for recheck.  Lipid panel will be drawn at this time.  Patient to call the office if he needs to return sooner.       I have personally reviewed this case with PA student. I also personally examined this patient. I agree with history and findings as documented above. I reviewed, discussed and approve of the assessment and plan as listed above. Rene Paci, MD

## 2012-11-14 NOTE — Assessment & Plan Note (Signed)
On simva - last lipids at goal The current medical regimen is effective;  continue present plan and medications. Check annually

## 2012-11-14 NOTE — Progress Notes (Signed)
  Subjective:    Patient ID: Aaron [name], male    DOB: [birth year], 79 y.o.   MRN: [id number]  HPI  here for follow up  -wife and dtr asst with hx as needed  See PA student note   Past Medical History  Diagnosis Date  . CAD (coronary artery disease) 04/08/09    Native vessel s/p overlapoing Xience DES stent mid Circumflex  . Other specified forms of chronic ischemic heart disease   . Acute on chronic systolic heart failure   . COPD (chronic obstructive pulmonary disease) 05/2009  . Hyperlipidemia   . Colon cancer 2008    Colon  . Acute myocardial infarction, unspecified site, episode of care unspecified 10/2008    Review of Systems  Constitutional: Positive for fatigue. Negative for unexpected weight change.  Respiratory: Negative for cough, shortness of breath and wheezing.   Cardiovascular: Negative for chest pain and palpitations.    Objective:   Physical Exam  BP 110/58  Pulse 66  Temp(Src) 98.6 F (37 C) (Oral)  Wt 155 lb 1.9 oz (70.362 kg)  BMI 28.36 kg/m2  SpO2 93% Wt Readings from Last 3 Encounters:  11/14/12 155 lb 1.9 oz (70.362 kg)  05/31/12 159 lb 12 oz (72.462 kg)  05/30/12 161 lb (73.029 kg)   Constitutional:  He appears well-developed and well-nourished. No distress. Wife and dtr at side - HOH without change Neck: Normal range of motion. Neck supple. No JVD present. No thyromegaly present.  Cardiovascular: Normal rate, regular rhythm and normal heart sounds.  No murmur heard. trace R>L edema, no change Pulmonary/Chest: Effort normal - few rhonchi with sligt end exp wheeze L base. No respiratory distress.  Neurological: he is alert and oriented to person, place, and time. No cranial nerve deficit. Coordination normal.  Skin: Skin is warm and dry.  No erythema or ulceration.  Psychiatric: he has a normal mood and affect. behavior is normal. Judgment and thought content normal.     Lab Results  Component Value Date   WBC 7.1 11/16/2011   HGB 14.6  11/16/2011   HCT 44.4 11/16/2011   PLT 99.0* 11/16/2011   GLUCOSE 90 11/16/2011   CHOL 130 05/16/2012   TRIG 35.0 05/16/2012   HDL 49.90 05/16/2012   LDLCALC 73 05/16/2012   ALT 15 11/16/2011   AST 18 11/16/2011   NA 140 11/16/2011   K 4.6 11/16/2011   CL 102 11/16/2011   CREATININE 1.4 11/16/2011   BUN 24* 11/16/2011   CO2 27 11/16/2011   TSH 1.99 11/16/2011   INR 1.1 04/08/2009   HGBA1C  Value: 5.8 (NOTE)   The ADA recommends the following therapeutic goals for glycemic   control related to Hgb A1C measurement:   Goal of Therapy:   < 7.0% Hgb A1C   Action Suggested:  > 8.0% Hgb A1C   Ref:  Diabetes Care, 22, Suppl. 1, 1999 05/03/2007      Assessment & Plan:  See problem list. Medications and labs reviewed today.  Time spent with pt/family today 25 minutes, greater than 50% time spent counseling patient on OA, COPD, CAD and medication review. Also review of prior records

## 2012-11-14 NOTE — Assessment & Plan Note (Signed)
Improved nocturnal pseudo wheeze on scheduled benzonate and H2B for acid suppression; continue same Encouraged compliance with daily spiriva and prn albuterol Reviewed pulm meds, no other changes  

## 2012-11-14 NOTE — Assessment & Plan Note (Signed)
MI 04/2009 - Follows with cards - no active anginal symptoms - cont same meds

## 2012-11-14 NOTE — Patient Instructions (Signed)
Good to see you today. We have reviewed your prior records including labs and tests today Medications reviewed and updated, no changes recommended at this time. Please schedule followup in 6 months, call sooner if problems.

## 2012-11-30 ENCOUNTER — Ambulatory Visit (INDEPENDENT_AMBULATORY_CARE_PROVIDER_SITE_OTHER): Payer: Medicare Other | Admitting: Cardiovascular Disease

## 2012-11-30 ENCOUNTER — Telehealth: Payer: Self-pay | Admitting: Cardiovascular Disease

## 2012-11-30 ENCOUNTER — Encounter: Payer: Self-pay | Admitting: Cardiovascular Disease

## 2012-11-30 VITALS — BP 116/64 | HR 76 | Resp 12 | Wt 153.0 lb

## 2012-11-30 DIAGNOSIS — I2510 Atherosclerotic heart disease of native coronary artery without angina pectoris: Secondary | ICD-10-CM

## 2012-11-30 DIAGNOSIS — I255 Ischemic cardiomyopathy: Secondary | ICD-10-CM

## 2012-11-30 DIAGNOSIS — I2589 Other forms of chronic ischemic heart disease: Secondary | ICD-10-CM

## 2012-11-30 MED ORDER — ASPIRIN EC 81 MG PO TBEC: 81.0000 mg | DELAYED_RELEASE_TABLET | Freq: Every day | ORAL | Status: DC

## 2012-11-30 NOTE — Progress Notes (Signed)
History of Present Illness: 79 yo male with history of CAD, ICM, COPD, BPH and HLD here today for cardiac follow up. Patient suffered a non-STEMI in 04/2009. LHC 10/10: proximal LAD 25%, CFX subtotally occluded, proximal-mid RCA 25%. PCI: Xience DES x2 to the CFX. He was not started on a beta blocker prior to discharge because of hypotension. He was discharged on Lisinopril and Lasix but both of these medications were stopped because of hypotension. He was admitted to the hospital 05/20/09 with respiratory distress felt to be secondary to COPD exacerbation as well as CHF exacerbation. We diuresed him and pulmonary also saw him and recommended antibiotics, steroids with taper as outpatient, nebulizers, spriva. His diuretics were stopped before discharge because of renal insufficiency. He has been on Spiriva per Dr. Vassie Loll.   Echo 1/11: EF 30-35% with inferior, posterior and inferoseptal HK, grade 1 diastolic dysfunction, trivial AI, mild to moderate MR-posteriorly directed, likely due to tethering of the posterior leaflet, mild LAE, mild RAE, PASP 28.   He is here today for follow up.   Primary Care Physician: Rene Paci  Last Lipid Profile:Lipid Panel     Component Value Date/Time   CHOL 130 05/16/2012 1110   TRIG 35.0 05/16/2012 1110   HDL 49.90 05/16/2012 1110   CHOLHDL 3 05/16/2012 1110   VLDL 7.0 05/16/2012 1110   LDLCALC 73 05/16/2012 1110     Past Medical History  Diagnosis Date  . CAD (coronary artery disease) 04/08/09    Native vessel s/p overlapoing Xience DES stent mid Circumflex  . Other specified forms of chronic ischemic heart disease   . Acute on chronic systolic heart failure   . COPD (chronic obstructive pulmonary disease) 05/2009  . Hyperlipidemia   . Colon cancer 2008    Colon  . Acute myocardial infarction, unspecified site, episode of care unspecified 10/2008    Past Surgical History  Procedure Laterality Date  . Sigmoid colectomy      Done by Dr. Darnell Level  . Tonsillectomy      childhood  . Appendectomy      Current Outpatient Prescriptions  Medication Sig Dispense Refill  . albuterol (VENTOLIN HFA) 108 (90 BASE) MCG/ACT inhaler Inhale into the lungs every 6 (six) hours as needed.        . Ascorbic Acid (VITAMIN C) 500 MG tablet Take 500 mg by mouth daily.        Marland Kitchen aspirin 325 MG EC tablet Take 325 mg by mouth daily.        . benzonatate (TESSALON) 200 MG capsule TAKE ONE CAPSULE BY MOUTH EVERY NIGHT AT BEDTIME  30 capsule  0  . Cholecalciferol (VITAMIN D) 2000 UNIT CAPS Take by mouth daily.        . furosemide (LASIX) 20 MG tablet TAKE 1 TABLET BY MOUTH EVERY DAY  30 tablet  6  . potassium chloride SA (K-DUR,KLOR-CON) 20 MEQ tablet TAKE 1 TABLET BY MOUTH DAILY  30 tablet  5  . simvastatin (ZOCOR) 40 MG tablet Take 1 tablet (40 mg total) by mouth at bedtime.  30 tablet  6  . Spacer/Aero-Holding Chambers (AEROCHAMBER Z-STAT PLUS CHAMBR) MISC by Does not apply route. Use as directed with MDI      . SPIRIVA HANDIHALER 18 MCG inhalation capsule USE 1 INHALATION BY MOUTH DAILY  30 capsule  3  . vitamin B-12 (CYANOCOBALAMIN) 100 MCG tablet Take 100 mcg by mouth daily.        . vitamin  E 400 UNIT capsule Take 400 Units by mouth daily.         No current facility-administered medications for this visit.    No Known Allergies  History   Social History  . Marital Status: Married    Spouse Name: N/A    Number of Children: N/A  . Years of Education: N/A   Occupational History  . Not on file.   Social History Main Topics  . Smoking status: Former Smoker    Types: Cigarettes  . Smokeless tobacco: Not on file     Comment: Married, lives with spouse. Supportive dtr lives nearby. Retired Education administrator. 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 Alcohol Use: No  . Drug Use: No  . Sexually Active: Not on file   Other Topics Concern  . Not on file   Social History Narrative  . No  narrative on file    No family history on file.  Review of Systems:  As stated in the HPI and otherwise negative.   BP 116/64  Pulse 76  Resp 12  Wt 153 lb (69.4 kg)  BMI 27.98 kg/m2  Physical Examination: General: Well developed, well nourished, NAD HEENT: OP clear, mucus membranes moist SKIN: warm, dry. No rashes. Neuro: No focal deficits Musculoskeletal: Muscle strength 5/5 all ext Psychiatric: Mood and affect normal Neck: No JVD, no carotid bruits, no thyromegaly, no lymphadenopathy. Lungs:Clear bilaterally, no wheezes, rhonci, crackles Cardiovascular: Regular rate and rhythm. No murmurs, gallops or rubs. Abdomen:Soft. Bowel sounds present. Non-tender.  Extremities: No lower extremity edema. Pulses are 2 + in the bilateral DP/PT.  EKG:  Assessment and Plan:   1. CAD: Stable. No changes. He is on a statin and ASA. Will lower ASA to 81 mg po Qdaily. He is not on a beta blocker due to bradycardia.   2. CARDIOMYOPATHY, ISCHEMIC: Stable. He is not on an Ace-inh secondary to renal insufficiency when Ace-inh started. He is not on a beta blocker secondary to bradycardia. Not felt to be an ICD candidate.   3. DYSLIPIDEMIA:  On a statin.  4. Chronic systolic CHF: Volume status ok. On Lasix 20 mg po QDaily and KDur

## 2012-11-30 NOTE — Telephone Encounter (Signed)
Mailed an Adult Proxy Authorization to the patient's home address per PA,RN

## 2012-11-30 NOTE — Patient Instructions (Signed)
Your physician wants you to follow-up in:  6 months. You will receive a reminder letter in the mail two months in advance. If you don't receive a letter, please call our office to schedule the follow-up appointment.  Your physician has recommended you make the following change in your medication:  Decrease aspirin to 81 mg by mouth daily

## 2012-12-05 ENCOUNTER — Other Ambulatory Visit: Payer: Self-pay | Admitting: Internal Medicine

## 2013-01-02 ENCOUNTER — Encounter: Payer: Self-pay | Admitting: Internal Medicine

## 2013-01-02 ENCOUNTER — Ambulatory Visit (INDEPENDENT_AMBULATORY_CARE_PROVIDER_SITE_OTHER): Payer: Medicare Other | Admitting: Internal Medicine

## 2013-01-02 VITALS — BP 122/60 | HR 70 | Temp 97.1°F | Wt 157.1 lb

## 2013-01-02 DIAGNOSIS — L259 Unspecified contact dermatitis, unspecified cause: Secondary | ICD-10-CM

## 2013-01-02 DIAGNOSIS — L309 Dermatitis, unspecified: Secondary | ICD-10-CM

## 2013-01-02 MED ORDER — HYDROXYZINE HCL 10 MG PO TABS: 10.0000 mg | ORAL_TABLET | Freq: Three times a day (TID) | ORAL | Status: DC | PRN

## 2013-01-02 MED ORDER — TRIAMCINOLONE ACETONIDE 0.1 % EX CREA: TOPICAL_CREAM | Freq: Two times a day (BID) | CUTANEOUS | Status: DC

## 2013-01-02 MED ORDER — METHYLPREDNISOLONE ACETATE 80 MG/ML IJ SUSP
80.0000 mg | Freq: Once | INTRAMUSCULAR | Status: AC
  Administered 2013-01-02: 80 mg via INTRAMUSCULAR

## 2013-01-02 NOTE — Addendum Note (Signed)
Addended by: Deatra James on: 01/02/2013 12:10 PM   Modules accepted: Orders

## 2013-01-02 NOTE — Progress Notes (Signed)
  Subjective:    Patient ID: Aaron [name], male    DOB: 1925-01-08, 79 y.o.   MRN: [id number]  HPI  Complains of itch Worse at right ankle Associated with scaling, erythema and mild swelling Denies infestation or outdoor exposure Also with similar rash over right thigh and buttock History of same  Past Medical History  Diagnosis Date  . CAD (coronary artery disease) 04/08/09    Native vessel s/p overlapoing Xience DES stent mid Circumflex  . Other specified forms of chronic ischemic heart disease   . Acute on chronic systolic heart failure   . COPD (chronic obstructive pulmonary disease) 05/2009  . Hyperlipidemia   . Colon cancer 2008    Colon  . Acute myocardial infarction, unspecified site, episode of care unspecified 10/2008     Review of Systems  Constitutional: Negative for fever and fatigue.  Respiratory: Negative for cough and shortness of breath.   Cardiovascular: Negative for chest pain and palpitations.       Objective:   Physical Exam  BP 122/60  Pulse 70  Temp(Src) 97.1 F (36.2 C) (Oral)  Wt 157 lb 1.9 oz (71.269 kg)  BMI 28.73 kg/m2  SpO2 91% Wt Readings from Last 3 Encounters:  01/02/13 157 lb 1.9 oz (71.269 kg)  11/30/12 153 lb (69.4 kg)  11/14/12 155 lb 1.9 oz (70.362 kg)   Constitutional:  He appears well-developed and well-nourished. No distress. Wife/dtr at side Cardiovascular: Normal rate, regular rhythm and normal heart sounds.  No murmur heard. no BLE edema Pulmonary/Chest: Effort normal and breath sounds normal. No respiratory distress. no wheezes.  Skin - eczema, anterior right ankle and above knee  Lab Results  Component Value Date   WBC 7.1 11/16/2011   HGB 14.6 11/16/2011   HCT 44.4 11/16/2011   PLT 99.0* 11/16/2011   GLUCOSE 90 11/16/2011   CHOL 130 05/16/2012   TRIG 35.0 05/16/2012   HDL 49.90 05/16/2012   LDLCALC 73 05/16/2012   ALT 15 11/16/2011   AST 18 11/16/2011   NA 140 11/16/2011   K 4.6 11/16/2011   CL 102 11/16/2011   CREATININE 1.4 11/16/2011   BUN 24* 11/16/2011   CO2 27 11/16/2011   TSH 1.99 11/16/2011   INR 1.1 04/08/2009   HGBA1C  Value: 5.8 (NOTE)   The ADA recommends the following therapeutic goals for glycemic   control related to Hgb A1C measurement:   Goal of Therapy:   < 7.0% Hgb A1C   Action Suggested:  > 8.0% Hgb A1C   Ref:  Diabetes Care, 22, Suppl. 1, 1999 05/03/2007   No results found.      Assessment & Plan:   Eczema - IM medrol 80 today Antihistamine pill, low dose, qhs to help itch symptoms Triamcinolone lotion to apply to left ankle where most exacerbated - erx done

## 2013-01-02 NOTE — Patient Instructions (Signed)
It was good to see you today. Medrol shot given to you today for itching skin Use Atarax at bedtime as needed for itch and apply steroid lotion to affected skin twice daily as needed - Your prescription(s) have been submitted to your pharmacy. Please take as directed and contact our office if you believe you are having problem(s) with the medication(s). Followup in 2 weeks if symptoms unimproved, sooner if worse  Eczema Atopic dermatitis, or eczema, is an inherited type of sensitive skin. Often people with eczema have a family history of allergies, asthma, or hay fever. It causes a red itchy rash and dry scaly skin. The itchiness may occur before the skin rash and may be very intense. It is not contagious. Eczema is generally worse during the cooler winter months and often improves with the warmth of summer. Eczema usually starts showing signs in infancy. Some children outgrow eczema, but it may last through adulthood. Flare-ups may be caused by:  Eating something or contact with something you are sensitive or allergic to.  Stress. DIAGNOSIS  The diagnosis of eczema is usually based upon symptoms and medical history. TREATMENT  Eczema cannot be cured, but symptoms usually can be controlled with treatment or avoidance of allergens (things to which you are sensitive or allergic to).  Controlling the itching and scratching.  Use over-the-counter antihistamines as directed for itching. It is especially useful at night when the itching tends to be worse.  Use over-the-counter steroid creams as directed for itching.  Scratching makes the rash and itching worse and may cause impetigo (a skin infection) if fingernails are contaminated (dirty).  Keeping the skin well moisturized with creams every day. This will seal in moisture and help prevent dryness. Lotions containing alcohol and water can dry the skin and are not recommended.  Limiting exposure to allergens.  Recognizing situations that cause  stress.  Developing a plan to manage stress. HOME CARE INSTRUCTIONS   Take prescription and over-the-counter medicines as directed by your caregiver.  Do not use anything on the skin without checking with your caregiver.  Keep baths or showers short (5 minutes) in warm (not hot) water. Use mild cleansers for bathing. You may add non-perfumed bath oil to the bath water. It is best to avoid soap and bubble bath.  Immediately after a bath or shower, when the skin is still damp, apply a moisturizing ointment to the entire body. This ointment should be a petroleum ointment. This will seal in moisture and help prevent dryness. The thicker the ointment the better. These should be unscented.  Keep fingernails cut short and wash hands often. If your child has eczema, it may be necessary to put soft gloves or mittens on your child at night.  Dress in clothes made of cotton or cotton blends. Dress lightly, as heat increases itching.  Avoid foods that may cause flare-ups. Common foods include cow's milk, peanut butter, eggs and wheat.  Keep a child with eczema away from anyone with fever blisters. The virus that causes fever blisters (herpes simplex) can cause a serious skin infection in children with eczema. SEEK MEDICAL CARE IF:   Itching interferes with sleep.  The rash gets worse or is not better within one week following treatment.  The rash looks infected (pus or soft yellow scabs).  You or your child has an oral temperature above 102 F (38.9 C).  Your baby is older than 3 months with a rectal temperature of 100.5 F (38.1 C) or higher for  more than 1 day.  The rash flares up after contact with someone who has fever blisters. SEEK IMMEDIATE MEDICAL CARE IF:   Your baby is older than 3 months with a rectal temperature of 102 F (38.9 C) or higher.  Your baby is older than 3 months or younger with a rectal temperature of 100.4 F (38 C) or higher. Document Released: 06/19/2000  Document Revised: 09/14/2011 Document Reviewed: 04/24/2009 [facility] Patient Information 2014 [place], [place].

## 2013-01-03 ENCOUNTER — Other Ambulatory Visit: Payer: Self-pay | Admitting: *Deleted

## 2013-01-03 NOTE — Telephone Encounter (Signed)
Received fax needing PA on hydroxyzine. Notified insurance fax over PA form has been completed and fax back waiting on approval status...Raechel Chute

## 2013-01-04 NOTE — Telephone Encounter (Signed)
Received PA bck med has been approve. Notified [facility] with approval status...Raechel Chute

## 2013-01-06 ENCOUNTER — Other Ambulatory Visit: Payer: Self-pay | Admitting: Internal Medicine

## 2013-02-14 ENCOUNTER — Other Ambulatory Visit: Payer: Self-pay | Admitting: Internal Medicine

## 2013-03-16 ENCOUNTER — Other Ambulatory Visit: Payer: Self-pay | Admitting: Cardiovascular Disease

## 2013-03-23 ENCOUNTER — Other Ambulatory Visit: Payer: Self-pay | Admitting: Internal Medicine

## 2013-04-15 ENCOUNTER — Other Ambulatory Visit: Payer: Self-pay | Admitting: Cardiovascular Disease

## 2013-05-11 ENCOUNTER — Other Ambulatory Visit: Payer: Self-pay | Admitting: Internal Medicine

## 2013-05-11 ENCOUNTER — Encounter: Payer: Self-pay | Admitting: Podiatry

## 2013-05-11 ENCOUNTER — Ambulatory Visit (INDEPENDENT_AMBULATORY_CARE_PROVIDER_SITE_OTHER): Payer: Medicare Other | Admitting: Podiatry

## 2013-05-11 VITALS — BP 142/86 | HR 86 | Resp 12

## 2013-05-11 DIAGNOSIS — M79609 Pain in unspecified limb: Secondary | ICD-10-CM

## 2013-05-11 DIAGNOSIS — B351 Tinea unguium: Secondary | ICD-10-CM

## 2013-05-11 NOTE — Progress Notes (Signed)
Subjective:     Patient ID: Aaron [name], male   DOB: 1925/02/05, 79 y.o.   MRN: [id number]  HPI patient states I have nail deformity with pain 1-5 both feet   Review of Systems     Objective:   Physical Exam Neurovascular status intact. Noted to have thick dystrophic nails with pain 1-5 both feet    Assessment:     Mycotic nail infection with pain 1-5 both feet    Plan:     Debridement of nailbeds 1-5 both feet

## 2013-05-18 ENCOUNTER — Ambulatory Visit (INDEPENDENT_AMBULATORY_CARE_PROVIDER_SITE_OTHER): Payer: Medicare Other | Admitting: Internal Medicine

## 2013-05-18 ENCOUNTER — Other Ambulatory Visit (INDEPENDENT_AMBULATORY_CARE_PROVIDER_SITE_OTHER): Payer: Medicare Other

## 2013-05-18 ENCOUNTER — Encounter: Payer: Self-pay | Admitting: Internal Medicine

## 2013-05-18 VITALS — BP 120/68 | HR 80 | Temp 98.0°F | Wt 164.8 lb

## 2013-05-18 DIAGNOSIS — J449 Chronic obstructive pulmonary disease, unspecified: Secondary | ICD-10-CM

## 2013-05-18 DIAGNOSIS — R609 Edema, unspecified: Secondary | ICD-10-CM

## 2013-05-18 DIAGNOSIS — J4489 Other specified chronic obstructive pulmonary disease: Secondary | ICD-10-CM

## 2013-05-18 DIAGNOSIS — E785 Hyperlipidemia, unspecified: Secondary | ICD-10-CM

## 2013-05-18 LAB — BASIC METABOLIC PANEL
Calcium: 9.1 mg/dL (ref 8.4–10.5)
GFR: 51.08 mL/min — ABNORMAL LOW (ref >60.00)
Potassium: 4.6 mEq/L (ref 3.5–5.1)
Sodium: 139 mEq/L (ref 135–145)

## 2013-05-18 LAB — HEPATIC FUNCTION PANEL
ALT: 17 U/L (ref 0–53)
AST: 21 U/L (ref 0–37)
Albumin: 3.2 g/dL — ABNORMAL LOW (ref 3.5–5.2)
Bilirubin, Direct: 0.1 mg/dL (ref 0.0–0.3)
Total Bilirubin: 0.8 mg/dL (ref 0.3–1.2)

## 2013-05-18 LAB — CBC WITH DIFFERENTIAL/PLATELET
Basophils Absolute: 0 10*3/uL (ref 0.0–0.1)
Basophils Relative: 0.5 % (ref 0.0–3.0)
Eosinophils Relative: 4 % (ref 0.0–5.0)
Hemoglobin: 13.6 g/dL (ref 13.0–17.0)
Lymphocytes Relative: 39.2 % (ref 12.0–46.0)
Monocytes Relative: 16.7 % — ABNORMAL HIGH (ref 3.0–12.0)
Neutro Abs: 2.6 10*3/uL (ref 1.4–7.7)
RBC: 4.32 Mil/uL (ref 4.22–5.81)
RDW: 13.9 % (ref 11.5–14.6)
WBC: 6.6 10*3/uL (ref 4.5–10.5)

## 2013-05-18 LAB — LIPID PANEL
Cholesterol: 120 mg/dL (ref 0–200)
HDL: 43 mg/dL (ref >39.00)
LDL Cholesterol: 67 mg/dL (ref 0–99)
Total CHOL/HDL Ratio: 3
VLDL: 9.6 mg/dL (ref 0.0–40.0)

## 2013-05-18 MED ORDER — TRIAMCINOLONE ACETONIDE 0.1 % EX CREA: TOPICAL_CREAM | Freq: Two times a day (BID) | CUTANEOUS | Status: DC

## 2013-05-18 MED ORDER — FUROSEMIDE 20 MG PO TABS: 40.0000 mg | ORAL_TABLET | Freq: Every day | ORAL | Status: DC

## 2013-05-18 NOTE — Progress Notes (Signed)
  Subjective:    Patient ID: Aaron [name], male    DOB: [birth year], 79 y.o.   MRN: [id number]  HPI here for follow up  - reviewed chronic medical issues interval medical events  Past Medical History  Diagnosis Date  . CAD (coronary artery disease) 04/08/09    Native vessel s/p overlapoing Xience DES stent mid Circumflex  . Other specified forms of chronic ischemic heart disease   . Acute on chronic systolic heart failure   . COPD (chronic obstructive pulmonary disease) 05/2009  . Hyperlipidemia   . Colon cancer 2008    Colon  . Acute myocardial infarction, unspecified site, episode of care unspecified 10/2008    Review of Systems  Constitutional: Positive for fatigue. Negative for unexpected weight change.  Respiratory: Negative for cough, shortness of breath and wheezing.   Cardiovascular: Positive for leg swelling. Negative for chest pain and palpitations.    Objective:   Physical Exam BP 120/68  Pulse 80  Temp(Src) 98 F (36.7 C) (Oral)  Wt 164 lb 12.8 oz (74.753 kg)  SpO2 92% Wt Readings from Last 3 Encounters:  05/18/13 164 lb 12.8 oz (74.753 kg)  01/02/13 157 lb 1.9 oz (71.269 kg)  11/30/12 153 lb (69.4 kg)   Constitutional:  He appears well-developed and well-nourished. No distress. Wife and dtr at side - HOH without change Neck: Normal range of motion. Neck supple. No JVD present. No thyromegaly present.  Cardiovascular: Normal rate, regular rhythm and normal heart sounds.  No murmur heard. 2+ R>L BLE edema Pulmonary/Chest: Effort normal - few rhonchi with sligt end exp wheeze L base. No respiratory distress.  Neurological: he is alert and oriented to person, place, and time. No cranial nerve deficit. Coordination normal.  Skin: Skin is warm and dry.  No erythema or ulceration.  Psychiatric: he has a normal mood and affect. behavior is normal. Judgment and thought content normal.     Lab Results  Component Value Date   WBC 7.1 11/16/2011   HGB 14.6 11/16/2011    HCT 44.4 11/16/2011   PLT 99.0* 11/16/2011   GLUCOSE 90 11/16/2011   CHOL 130 05/16/2012   TRIG 35.0 05/16/2012   HDL 49.90 05/16/2012   LDLCALC 73 05/16/2012   ALT 15 11/16/2011   AST 18 11/16/2011   NA 140 11/16/2011   K 4.6 11/16/2011   CL 102 11/16/2011   CREATININE 1.4 11/16/2011   BUN 24* 11/16/2011   CO2 27 11/16/2011   TSH 1.99 11/16/2011   INR 1.1 04/08/2009   HGBA1C  Value: 5.8 (NOTE)   The ADA recommends the following therapeutic goals for glycemic   control related to Hgb A1C measurement:   Goal of Therapy:   < 7.0% Hgb A1C   Action Suggested:  > 8.0% Hgb A1C   Ref:  Diabetes Care, 22, Suppl. 1, 1999 05/03/2007      Assessment & Plan:  See problem list. Medications and labs reviewed today.

## 2013-05-18 NOTE — Progress Notes (Signed)
Pre-visit discussion using our clinic review tool. No additional management support is needed unless otherwise documented below in the visit note.  

## 2013-05-18 NOTE — Assessment & Plan Note (Signed)
On simva - last lipids at goal The current medical regimen is effective;  continue present plan and medications. Check annually

## 2013-05-18 NOTE — Patient Instructions (Signed)
It was good to see you today.  We have reviewed your prior records including labs and tests today  Medications reviewed and updated, increase Lasix to 40 mg daily -no other changes recommended at this time  Test(s) ordered today. Your results will be released to MyChart (or called to you) after review, usually within 72hours after test completion. If any changes need to be made, you will be notified at that same time.  Will refer for cardiac ultrasound  To followup with cardiologist as planned  Please schedule followup in 3-4 months, call sooner if problems.

## 2013-05-18 NOTE — Assessment & Plan Note (Signed)
Increase BLE edema-  Given hx ICM, will increase lasix and check 2d echo Check labs and follow up cards as planned

## 2013-05-18 NOTE — Assessment & Plan Note (Signed)
Improved nocturnal pseudo wheeze on scheduled benzonate and H2B for acid suppression; continue same Encouraged compliance with daily spiriva and prn albuterol Reviewed pulm meds, no other changes  

## 2013-05-24 ENCOUNTER — Encounter: Payer: Self-pay | Admitting: Cardiovascular Disease

## 2013-05-24 ENCOUNTER — Ambulatory Visit (INDEPENDENT_AMBULATORY_CARE_PROVIDER_SITE_OTHER): Payer: Medicare Other | Admitting: Cardiovascular Disease

## 2013-05-24 VITALS — BP 135/80 | HR 73 | Ht 61.0 in | Wt 159.0 lb

## 2013-05-24 DIAGNOSIS — I509 Heart failure, unspecified: Secondary | ICD-10-CM

## 2013-05-24 DIAGNOSIS — E785 Hyperlipidemia, unspecified: Secondary | ICD-10-CM

## 2013-05-24 DIAGNOSIS — I2589 Other forms of chronic ischemic heart disease: Secondary | ICD-10-CM

## 2013-05-24 DIAGNOSIS — I2510 Atherosclerotic heart disease of native coronary artery without angina pectoris: Secondary | ICD-10-CM

## 2013-05-24 DIAGNOSIS — I5022 Chronic systolic (congestive) heart failure: Secondary | ICD-10-CM

## 2013-05-24 DIAGNOSIS — I255 Ischemic cardiomyopathy: Secondary | ICD-10-CM

## 2013-05-24 NOTE — Patient Instructions (Signed)
Your physician wants you to follow-up in:  6 months. You will receive a reminder letter in the mail two months in advance. If you don't receive a letter, please call our office to schedule the follow-up appointment.   

## 2013-05-24 NOTE — Progress Notes (Signed)
History of Present Illness: 79 yo male with history of CAD, ICM, COPD, BPH and HLD here today for cardiac follow up. Patient suffered a non-STEMI in 04/2009. LHC 10/10: proximal LAD 25%, CFX subtotally occluded, proximal-mid RCA 25%. PCI: Xience DES x2 to the CFX. He was not started on a beta blocker prior to discharge because of hypotension. He was discharged on Lisinopril and Lasix but both of these medications were stopped because of hypotension. He was admitted to the hospital 05/20/09 with respiratory distress felt to be secondary to COPD exacerbation as well as CHF exacerbation. He has been on Spiriva per Dr. Vassie Loll.   Echo 1/11: EF 30-35% with inferior, posterior and inferoseptal HK, grade 1 diastolic dysfunction, trivial AI, mild to moderate MR-posteriorly directed, likely due to tethering of the posterior leaflet, mild LAE, mild RAE, PASP 28.   He is here today for follow up. He has been doing well. He denies chest pain. He has had recent increase in lower ext edema secondary to sitting up dangling legs during day and sleeping on couch with legs dangling. He has been seen in primary care last week and his Lasix was increased to 40 mg po QDaily. Echo planned for next week.   Primary Care Physician: Rene Paci  Last Lipid Profile:Lipid Panel     Component Value Date/Time   CHOL 120 05/18/2013 1153   TRIG 48.0 05/18/2013 1153   HDL 43.00 05/18/2013 1153   CHOLHDL 3 05/18/2013 1153   VLDL 9.6 05/18/2013 1153   LDLCALC 67 05/18/2013 1153     Past Medical History  Diagnosis Date  . CAD (coronary artery disease) 04/08/09    Native vessel s/p overlapoing Xience DES stent mid Circumflex  . Other specified forms of chronic ischemic heart disease   . Acute on chronic systolic heart failure   . COPD (chronic obstructive pulmonary disease) 05/2009  . Hyperlipidemia   . Colon cancer 2008    Colon  . Acute myocardial infarction, unspecified site, episode of care unspecified 10/2008     Past Surgical History  Procedure Laterality Date  . Sigmoid colectomy      Done by Dr. Darnell Level  . Tonsillectomy      childhood  . Appendectomy      Current Outpatient Prescriptions  Medication Sig Dispense Refill  . albuterol (VENTOLIN HFA) 108 (90 BASE) MCG/ACT inhaler Inhale into the lungs every 6 (six) hours as needed.        . Ascorbic Acid (VITAMIN C) 500 MG tablet Take 500 mg by mouth daily.        Marland Kitchen aspirin 81 MG tablet Take 1 tablet (81 mg total) by mouth daily.  30 tablet  6  . benzonatate (TESSALON) 200 MG capsule TAKE ONE CAPSULE BY MOUTH EVERY NIGHT AT BEDTIME  30 capsule  0  . Cholecalciferol (VITAMIN D) 2000 UNIT CAPS Take by mouth daily.        . furosemide (LASIX) 20 MG tablet Take 2 tablets (40 mg total) by mouth daily. Or as directed  60 tablet  6  . potassium chloride SA (K-DUR,KLOR-CON) 20 MEQ tablet TAKE 1 TABLET BY MOUTH EVERY DAY  30 tablet  1  . simvastatin (ZOCOR) 40 MG tablet Take 1 tablet (40 mg total) by mouth at bedtime.  30 tablet  6  . Spacer/Aero-Holding Chambers (AEROCHAMBER Z-STAT PLUS CHAMBR) MISC by Does not apply route. Use as directed with MDI      . SPIRIVA HANDIHALER 18  MCG inhalation capsule USE 1 INHALATION BY MOUTH DAILY  30 capsule  3  . triamcinolone cream (KENALOG) 0.1 % Apply topically 2 (two) times daily.  30 g  0  . vitamin B-12 (CYANOCOBALAMIN) 100 MCG tablet Take 100 mcg by mouth daily.        . vitamin E 400 UNIT capsule Take 400 Units by mouth daily.         No current facility-administered medications for this visit.    No Known Allergies  History   Social History  . Marital Status: Married    Spouse Name: N/A    Number of Children: N/A  . Years of Education: N/A   Occupational History  . Not on file.   Social History Main Topics  . Smoking status: Former Smoker    Types: Cigarettes  . Smokeless tobacco: Not on file     Comment: Married, lives with spouse. Supportive dtr lives nearby. 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 Alcohol Use: No  . Drug Use: No  . Sexual Activity: Not on file   Other Topics Concern  . Not on file   Social History Narrative  . No narrative on file    No family history on file.  Review of Systems:  As stated in the HPI and otherwise negative.   BP 135/80  Pulse 73  Ht 5\' 1"  (1.549 m)  Wt 159 lb (72.122 kg)  BMI 30.06 kg/m2  Physical Examination: General: Well developed, well nourished, NAD HEENT: OP clear, mucus membranes moist SKIN: warm, dry. No rashes. Neuro: No focal deficits Musculoskeletal: Muscle strength 5/5 all ext Psychiatric: Mood and affect normal Neck: No JVD, no carotid bruits, no thyromegaly, no lymphadenopathy. Lungs:Clear bilaterally, no wheezes, rhonci, crackles Cardiovascular: Regular rate and rhythm. No murmurs, gallops or rubs. Abdomen:Soft. Bowel sounds present. Non-tender.  Extremities: 2+ bilateral lower extremity edema.  EKG: Sinus, PACs. Possible septal infarct, unchanged. T wave inversions inferior and anterolateral leads, unchanged.   Assessment and Plan:   1. CAD: Stable. No changes. He is on a statin and ASA. He is not on a beta blocker due to bradycardia.   2. CARDIOMYOPATHY, ISCHEMIC: Stable. He is not on an Ace-inh secondary to renal insufficiency when Ace-inh started. He is not on a beta blocker secondary to bradycardia. Not felt to be an ICD candidate. Echo is to be repeated next week per plans of Dr. Felicity Coyer.   3. DYSLIPIDEMIA:  Continue statin.  4. Chronic systolic CHF: Recent LE edema. Lasix was increased and there is some improvement in his swelling. Continue Lasix 40 mg po QDaily. Check BMET today.

## 2013-05-25 LAB — BASIC METABOLIC PANEL
BUN: 29 mg/dL — ABNORMAL HIGH (ref 6–23)
CO2: 24 mEq/L (ref 19–32)
Chloride: 106 mEq/L (ref 96–112)
Creatinine, Ser: 1.5 mg/dL (ref 0.4–1.5)
Sodium: 136 mEq/L (ref 135–145)

## 2013-05-31 ENCOUNTER — Ambulatory Visit (HOSPITAL_COMMUNITY): Payer: Medicare Other | Attending: Cardiology | Admitting: Cardiology

## 2013-05-31 ENCOUNTER — Encounter: Payer: Self-pay | Admitting: Cardiology

## 2013-05-31 ENCOUNTER — Other Ambulatory Visit (HOSPITAL_COMMUNITY): Payer: Self-pay | Admitting: Internal Medicine

## 2013-05-31 DIAGNOSIS — I379 Nonrheumatic pulmonary valve disorder, unspecified: Secondary | ICD-10-CM | POA: Insufficient documentation

## 2013-05-31 DIAGNOSIS — I5022 Chronic systolic (congestive) heart failure: Secondary | ICD-10-CM

## 2013-05-31 DIAGNOSIS — R609 Edema, unspecified: Secondary | ICD-10-CM

## 2013-05-31 DIAGNOSIS — I359 Nonrheumatic aortic valve disorder, unspecified: Secondary | ICD-10-CM | POA: Insufficient documentation

## 2013-05-31 DIAGNOSIS — Z87891 Personal history of nicotine dependence: Secondary | ICD-10-CM | POA: Insufficient documentation

## 2013-05-31 DIAGNOSIS — E785 Hyperlipidemia, unspecified: Secondary | ICD-10-CM | POA: Insufficient documentation

## 2013-05-31 DIAGNOSIS — I428 Other cardiomyopathies: Secondary | ICD-10-CM | POA: Insufficient documentation

## 2013-05-31 DIAGNOSIS — I079 Rheumatic tricuspid valve disease, unspecified: Secondary | ICD-10-CM | POA: Insufficient documentation

## 2013-05-31 DIAGNOSIS — I059 Rheumatic mitral valve disease, unspecified: Secondary | ICD-10-CM | POA: Insufficient documentation

## 2013-05-31 DIAGNOSIS — J449 Chronic obstructive pulmonary disease, unspecified: Secondary | ICD-10-CM

## 2013-05-31 DIAGNOSIS — I509 Heart failure, unspecified: Secondary | ICD-10-CM | POA: Insufficient documentation

## 2013-05-31 DIAGNOSIS — I2510 Atherosclerotic heart disease of native coronary artery without angina pectoris: Secondary | ICD-10-CM

## 2013-05-31 DIAGNOSIS — I2589 Other forms of chronic ischemic heart disease: Secondary | ICD-10-CM

## 2013-05-31 DIAGNOSIS — I252 Old myocardial infarction: Secondary | ICD-10-CM | POA: Insufficient documentation

## 2013-05-31 NOTE — Progress Notes (Signed)
Echo performed. 

## 2013-06-02 ENCOUNTER — Encounter: Payer: Self-pay | Admitting: Internal Medicine

## 2013-06-17 ENCOUNTER — Other Ambulatory Visit: Payer: Self-pay | Admitting: Cardiovascular Disease

## 2013-08-28 ENCOUNTER — Other Ambulatory Visit: Payer: Self-pay | Admitting: Cardiovascular Disease

## 2013-09-07 ENCOUNTER — Encounter: Payer: Self-pay | Admitting: Podiatry

## 2013-09-07 ENCOUNTER — Ambulatory Visit (INDEPENDENT_AMBULATORY_CARE_PROVIDER_SITE_OTHER): Payer: Medicare Other | Admitting: Podiatry

## 2013-09-07 VITALS — BP 124/56 | HR 68 | Resp 12

## 2013-09-07 DIAGNOSIS — B351 Tinea unguium: Secondary | ICD-10-CM

## 2013-09-07 DIAGNOSIS — M79609 Pain in unspecified limb: Secondary | ICD-10-CM

## 2013-09-08 NOTE — Progress Notes (Signed)
Subjective:     Patient ID: Aaron [name], male   DOB: 07/05/1925, 80 y.o.   MRN: [id number]  HPI patient presents with thick painful toenails 1-5 both feet that he cannot cut himself   Review of Systems     Objective:   Physical Exam Mycotic nail infection with pain 1-5 both feet with traumatized nailbeds noted    Assessment:     Mycotic nails noted 1-5 both feet that are painful when pressed and difficult for him to cut    Plan:     Debridement nailbeds 1-5 both feet with no bleeding noted

## 2013-09-09 ENCOUNTER — Other Ambulatory Visit: Payer: Self-pay | Admitting: Cardiovascular Disease

## 2013-10-17 ENCOUNTER — Other Ambulatory Visit: Payer: Self-pay | Admitting: Cardiovascular Disease

## 2013-11-16 ENCOUNTER — Other Ambulatory Visit: Payer: Self-pay | Admitting: Cardiovascular Disease

## 2013-11-22 ENCOUNTER — Encounter: Payer: Self-pay | Admitting: Cardiovascular Disease

## 2013-11-22 ENCOUNTER — Ambulatory Visit (INDEPENDENT_AMBULATORY_CARE_PROVIDER_SITE_OTHER): Payer: Medicare Other | Admitting: Cardiovascular Disease

## 2013-11-22 VITALS — BP 104/52 | HR 64 | Ht 61.0 in | Wt 149.0 lb

## 2013-11-22 DIAGNOSIS — I2510 Atherosclerotic heart disease of native coronary artery without angina pectoris: Secondary | ICD-10-CM

## 2013-11-22 DIAGNOSIS — I509 Heart failure, unspecified: Secondary | ICD-10-CM

## 2013-11-22 DIAGNOSIS — I5022 Chronic systolic (congestive) heart failure: Secondary | ICD-10-CM

## 2013-11-22 DIAGNOSIS — I2589 Other forms of chronic ischemic heart disease: Secondary | ICD-10-CM

## 2013-11-22 DIAGNOSIS — E785 Hyperlipidemia, unspecified: Secondary | ICD-10-CM

## 2013-11-22 DIAGNOSIS — I255 Ischemic cardiomyopathy: Secondary | ICD-10-CM

## 2013-11-22 LAB — BASIC METABOLIC PANEL
BUN: 30 mg/dL — ABNORMAL HIGH (ref 6–23)
CO2: 27 mEq/L (ref 19–32)
Calcium: 8.7 mg/dL (ref 8.4–10.5)
Chloride: 107 mEq/L (ref 96–112)
Creatinine, Ser: 1.6 mg/dL — ABNORMAL HIGH (ref 0.4–1.5)
GFR: 45.03 mL/min — ABNORMAL LOW (ref >60.00)
Glucose, Bld: 128 mg/dL — ABNORMAL HIGH (ref 70–99)
POTASSIUM: 3.9 meq/L (ref 3.5–5.1)
Sodium: 139 mEq/L (ref 135–145)

## 2013-11-22 MED ORDER — TIOTROPIUM BROMIDE MONOHYDRATE 18 MCG IN CAPS: ORAL_CAPSULE | RESPIRATORY_TRACT | Status: DC

## 2013-11-22 NOTE — Patient Instructions (Signed)
Your physician wants you to follow-up in:  6 months. You will receive a reminder letter in the mail two months in advance. If you don't receive a letter, please call our office to schedule the follow-up appointment.   

## 2013-11-22 NOTE — Progress Notes (Signed)
History of Present Illness: 80 yo male with history of CAD, ICM, COPD, BPH and HLD here today for cardiac follow up. Patient suffered a non-STEMI in 04/2009. LHC 10/10: proximal LAD 25%, CFX subtotally occluded, proximal-mid RCA 25%. PCI: Xience DES x2 to the CFX. He was not started on a beta blocker prior to discharge because of hypotension. Has not tolerated Lisinopril due to hypotension. He has tolerated low dose Lasix. He was admitted to [facility] 05/20/09 with respiratory distress felt to be secondary to COPD exacerbation as well as CHF exacerbation. He has been on Spiriva per Dr. Elsworth Soho. Echo 11/14 with LV EF 35-40% with mild AI. LAE.   He is here today for follow up. He has been doing well. He denies chest pain. LE edema improved on Lasix.   Primary Care Physician: [name]  Last Lipid Profile:Lipid Panel     Component Value Date/Time   CHOL 120 05/18/2013 1153   TRIG 48.0 05/18/2013 1153   HDL 43.00 05/18/2013 1153   CHOLHDL 3 05/18/2013 1153   VLDL 9.6 05/18/2013 1153   LDLCALC 67 05/18/2013 1153    Past Medical History  Diagnosis Date  . CAD (coronary artery disease) 04/08/09    Native vessel s/p overlapoing Xience DES stent mid Circumflex  . Other specified forms of chronic ischemic heart disease   . Acute on chronic systolic heart failure   . COPD (chronic obstructive pulmonary disease) 05/2009  . Hyperlipidemia   . Colon cancer 2008    Colon  . Acute myocardial infarction, unspecified site, episode of care unspecified 10/2008    Past Surgical History  Procedure Laterality Date  . Sigmoid colectomy      Done by Dr. Armandina Gemma  . Tonsillectomy      childhood  . Appendectomy      Current Outpatient Prescriptions  Medication Sig Dispense Refill  . albuterol (VENTOLIN HFA) 108 (90 BASE) MCG/ACT inhaler Inhale into the lungs every 6 (six) hours as needed.        . Ascorbic Acid (VITAMIN C) 500 MG tablet Take 500 mg by mouth daily.       Marland Kitchen aspirin 81 MG  tablet Take 1 tablet (81 mg total) by mouth daily.  30 tablet  6  . benzonatate (TESSALON) 200 MG capsule TAKE ONE CAPSULE BY MOUTH EVERY NIGHT AT BEDTIME  30 capsule  0  . Cholecalciferol (VITAMIN D) 2000 UNIT CAPS Take by mouth daily.        . furosemide (LASIX) 20 MG tablet Take 2 tablets (40 mg total) by mouth daily. Or as directed  60 tablet  6  . potassium chloride SA (K-DUR,KLOR-CON) 20 MEQ tablet TAKE 1 TABLET BY MOUTH EVERY DAY  30 tablet  0  . simvastatin (ZOCOR) 40 MG tablet TAKE 1 TABLET BY MOUTH EVERY NIGHT AT BEDTIME  30 tablet  2  . Spacer/Aero-Holding Chambers (AEROCHAMBER Z-STAT PLUS CHAMBR) MISC by Does not apply route. Use as directed with MDI      . SPIRIVA HANDIHALER 18 MCG inhalation capsule USE 1 INHALATION BY MOUTH DAILY  30 capsule  3  . vitamin B-12 (CYANOCOBALAMIN) 100 MCG tablet Take 100 mcg by mouth daily.        . vitamin E 400 UNIT capsule Take 400 Units by mouth daily.         No current facility-administered medications for this visit.    No Known Allergies  History   Social History  . Marital  Status: Married    Spouse Name: N/A    Number of Children: N/A  . Years of Education: N/A   Occupational History  . Not on file.   Social History Main Topics  . Smoking status: Former Smoker    Types: Cigarettes  . Smokeless tobacco: Not on file     Comment: Married, lives with spouse. Supportive dtr lives nearby. 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 Alcohol Use: No  . Drug Use: No  . Sexual Activity: Not on file   Other Topics Concern  . Not on file   Social History Narrative  . No narrative on file    No family history on file.  Review of Systems:  As stated in the HPI and otherwise negative.   BP 104/52  Pulse 64  Ht 5\' 1"  (1.549 m)  Wt 149 lb (67.586 kg)  BMI 28.17 kg/m2  Physical Examination: General: Well developed, well nourished, NAD HEENT: OP clear, mucus  membranes moist SKIN: warm, dry. No rashes. Neuro: No focal deficits Musculoskeletal: Muscle strength 5/5 all ext Psychiatric: Mood and affect normal Neck: No JVD, no carotid bruits, no thyromegaly, no lymphadenopathy. Lungs:Clear bilaterally, no wheezes, rhonci, crackles Cardiovascular: Regular rate and rhythm. No murmurs, gallops or rubs. Abdomen:Soft. Bowel sounds present. Non-tender.  Extremities: 2+ bilateral lower extremity edema.  Echo 06/02/13: Left ventricle: The cavity size was normal. Wall thickness was normal. Systolic function was moderately reduced. The estimated ejection fraction was in the range of 35% to 40%. Regional wall motion abnormalities cannot be excluded. Features are consistent with a pseudonormal left ventricular filling pattern, with concomitant abnormal relaxation and increased filling pressure (grade 2 diastolic dysfunction). Doppler parameters are consistent with elevated mean left atrial filling pressure. - Aortic valve: Mild regurgitation. - Mitral valve: Calcified annulus. Mildly thickened leaflets . Moderate regurgitation directed posteriorly. - Left atrium: The atrium was severely dilated. - Right ventricle: Systolic pressure was increased. - Atrial septum: No defect or patent foramen ovale was identified. - Tricuspid valve: Mild-moderate regurgitation. - Pulmonary arteries: PA peak pressure: 69mm Hg (S).  Assessment and Plan:   1. CAD: Stable. No changes. He is on a statin and ASA. He is not on a beta blocker due to bradycardia.   2. CARDIOMYOPATHY, ISCHEMIC: Stable. He is not on an Ace-inh secondary to renal insufficiency when Ace-inh started and hypotension. He is not on a beta blocker secondary to bradycardia. Not felt to be an ICD candidate.   3. Hyperlipidemia: Continue statin.  4. Chronic systolic CHF: Continue Lasix 40 mg po QDaily. Volume well controlled. Check BMET today.

## 2013-11-26 ENCOUNTER — Other Ambulatory Visit: Payer: Self-pay | Admitting: Cardiovascular Disease

## 2013-12-13 ENCOUNTER — Other Ambulatory Visit: Payer: Self-pay | Admitting: Cardiovascular Disease

## 2013-12-17 ENCOUNTER — Other Ambulatory Visit: Payer: Self-pay | Admitting: Internal Medicine

## 2013-12-31 ENCOUNTER — Other Ambulatory Visit: Payer: Self-pay | Admitting: Cardiovascular Disease

## 2014-01-17 ENCOUNTER — Ambulatory Visit (INDEPENDENT_AMBULATORY_CARE_PROVIDER_SITE_OTHER): Payer: Medicare Other | Admitting: Internal Medicine

## 2014-01-17 ENCOUNTER — Encounter: Payer: Self-pay | Admitting: Internal Medicine

## 2014-01-17 VITALS — BP 98/50 | HR 77 | Temp 98.4°F | Ht 61.0 in | Wt 150.0 lb

## 2014-01-17 DIAGNOSIS — I2589 Other forms of chronic ischemic heart disease: Secondary | ICD-10-CM

## 2014-01-17 DIAGNOSIS — J438 Other emphysema: Secondary | ICD-10-CM

## 2014-01-17 NOTE — Progress Notes (Signed)
Subjective:    Patient ID: Aaron [name], male    DOB: 04-Jul-1925, 80 y.o.   MRN: [id number]  HPI  Patient here for follow up Reviewed chronic medical issues and interval medical events  Past Medical History  Diagnosis Date  . CAD (coronary artery disease) 04/08/09    Native vessel s/p overlapoing Xience DES stent mid Circumflex  . Other specified forms of chronic ischemic heart disease   . Acute on chronic systolic heart failure   . COPD (chronic obstructive pulmonary disease) 05/2009  . Hyperlipidemia   . Colon cancer 2008    Colon  . Acute myocardial infarction, unspecified site, episode of care unspecified 10/2008    Review of Systems  Constitutional: Negative for fatigue.  HENT: Positive for hearing loss (chronic).   Respiratory: Positive for cough. Negative for shortness of breath.   Cardiovascular: Positive for leg swelling (chronic and improved). Negative for chest pain.       Objective:   Physical Exam  BP 98/50  Pulse 77  Temp(Src) 98.4 F (36.9 C) (Oral)  Ht 5\' 1"  (1.549 m)  Wt 150 lb (68.04 kg)  BMI 28.36 kg/m2  SpO2 90% Wt Readings from Last 3 Encounters:  01/17/14 150 lb (68.04 kg)  11/22/13 149 lb (67.586 kg)  05/24/13 159 lb (72.122 kg)   Constitutional:  He is elderly and somewhat frail, very HOH -but appears well-developed and well-nourished. No distress. Wife and dtr at side  Neck: Normal range of motion. Neck supple. No JVD present. No thyromegaly present.  Cardiovascular: Normal rate, regular rhythm and normal heart sounds.  No murmur heard. 1+ R>L BLE edema Pulmonary/Chest: Effort normal - few rhonchi with slight end exp wheeze L>R base. No respiratory distress.  Neurological: he is alert and oriented to person, place, and time. No cranial nerve deficit. Coordination normal.  Psychiatric: he has a normal mood and affect. behavior is normal. Judgment and thought content normal.    Lab Results  Component Value Date   WBC 6.6 05/18/2013   HGB 13.6 05/18/2013   HCT 41.1 05/18/2013   PLT 99.0* 05/18/2013   GLUCOSE 128* 11/22/2013   CHOL 120 05/18/2013   TRIG 48.0 05/18/2013   HDL 43.00 05/18/2013   LDLCALC 67 05/18/2013   ALT 17 05/18/2013   AST 21 05/18/2013   NA 139 11/22/2013   K 3.9 11/22/2013   CL 107 11/22/2013   CREATININE 1.6* 11/22/2013   BUN 30* 11/22/2013   CO2 27 11/22/2013   TSH 1.78 05/18/2013   INR 1.1 04/08/2009   HGBA1C  Value: 5.8 (NOTE)   The ADA recommends the following therapeutic goals for glycemic   control related to Hgb A1C measurement:   Goal of Therapy:   < 7.0% Hgb A1C   Action Suggested:  > 8.0% Hgb A1C   Ref:  Diabetes Care, 22, Suppl. 1, 1999 05/03/2007    Dg Knee Complete 4 Views Right  05/30/2012   *RADIOLOGY REPORT*  Clinical Data: Right knee pain after twisting injury 3 weeks ago.  RIGHT KNEE - COMPLETE 4+ VIEW  Comparison: None  Findings: Mild medial compartmental degenerative articular space loss, without significant marginal spurring.  Questionable small knee effusion.  IMPRESSION:  1.  I suspect a small knee effusion, although positive predictive value is reduced by the degree of flexion on the lateral projection. 2.  Minimal degenerative loss of medial compartmental articular space.   Original Report Authenticated By: Van Clines, M.D.  Assessment & Plan:   Problem List Items Addressed This Visit   CARDIOMYOPATHY, ISCHEMIC - Primary     Chronic systolic and diastolic heart failure, compensated Cardiology evaluation for same reviewed Continue same    EMPHYSEMA     Improved nocturnal pseudo wheeze on scheduled benzonate and H2B for acid suppression; continue same Encouraged compliance with daily spiriva and prn albuterol Reviewed pulm meds, no other changes

## 2014-01-17 NOTE — Patient Instructions (Signed)
It was good to see you today.  We have reviewed your prior records including labs and tests today  Medications reviewed and updated, no changes recommended at this time.  Continue working with your other specialists as ongoing  Please schedule followup in 6 months, call sooner if problems.

## 2014-01-17 NOTE — Assessment & Plan Note (Signed)
Chronic systolic and diastolic heart failure, compensated Cardiology evaluation for same reviewed Continue same

## 2014-01-17 NOTE — Assessment & Plan Note (Signed)
Improved nocturnal pseudo wheeze on scheduled benzonate and H2B for acid suppression; continue same Encouraged compliance with daily spiriva and prn albuterol Reviewed pulm meds, no other changes

## 2014-01-17 NOTE — Progress Notes (Signed)
Pre visit review using our clinic review tool, if applicable. No additional management support is needed unless otherwise documented below in the visit note. 

## 2014-02-08 ENCOUNTER — Ambulatory Visit (INDEPENDENT_AMBULATORY_CARE_PROVIDER_SITE_OTHER): Payer: Medicare Other | Admitting: Podiatry

## 2014-02-08 DIAGNOSIS — M79609 Pain in unspecified limb: Secondary | ICD-10-CM

## 2014-02-08 DIAGNOSIS — M79673 Pain in unspecified foot: Secondary | ICD-10-CM

## 2014-02-08 DIAGNOSIS — B351 Tinea unguium: Secondary | ICD-10-CM

## 2014-02-08 NOTE — Progress Notes (Signed)
Subjective:     Patient ID: Aaron [name], male   DOB: 04-30-1925, 80 y.o.   MRN: [id number]  HPI patient has thick yellow brittle nails 1-5 both feet that he cannot take care of himself and he states that can become tender with shoe gear   Review of Systems     Objective:   Physical Exam Neurovascular status intact with thick yellow brittle nailbeds 1-5 both feet that are painful when pressed dorsally    Assessment:     Mycotic nail infection is with pain 1-5 both feet    Plan:     Debride painful nailbeds 1-5 both feet with no iatrogenic bleeding noted

## 2014-04-11 ENCOUNTER — Other Ambulatory Visit: Payer: Self-pay | Admitting: Cardiovascular Disease

## 2014-05-24 ENCOUNTER — Ambulatory Visit (INDEPENDENT_AMBULATORY_CARE_PROVIDER_SITE_OTHER): Payer: Medicare Other | Admitting: Cardiovascular Disease

## 2014-05-24 ENCOUNTER — Encounter: Payer: Self-pay | Admitting: Cardiovascular Disease

## 2014-05-24 VITALS — BP 110/60 | HR 64 | Ht 61.0 in | Wt 152.0 lb

## 2014-05-24 DIAGNOSIS — E785 Hyperlipidemia, unspecified: Secondary | ICD-10-CM

## 2014-05-24 DIAGNOSIS — I255 Ischemic cardiomyopathy: Secondary | ICD-10-CM

## 2014-05-24 DIAGNOSIS — I2510 Atherosclerotic heart disease of native coronary artery without angina pectoris: Secondary | ICD-10-CM

## 2014-05-24 DIAGNOSIS — I5022 Chronic systolic (congestive) heart failure: Secondary | ICD-10-CM

## 2014-05-24 NOTE — Addendum Note (Signed)
Addended by: Thompson Grayer on: 05/24/2014 01:51 PM   Modules accepted: Orders

## 2014-05-24 NOTE — Progress Notes (Signed)
History of Present Illness: 80 yo male with history of CAD, ICM, COPD, BPH and HLD here today for cardiac follow up. Patient suffered a non-STEMI in 04/2009. LHC 10/10: proximal LAD 25%, CFX subtotally occluded, proximal-mid RCA 25%. PCI: Xience DES x2 to the CFX. He was not started on a beta blocker prior to discharge because of hypotension. Has not tolerated Lisinopril due to hypotension. He has tolerated low dose Lasix. He was admitted to [facility] 05/20/09 with respiratory distress felt to be secondary to COPD exacerbation as well as CHF exacerbation. He has been on Spiriva per Dr. Elsworth Soho. Echo 11/14 with LV EF 35-40% with mild AI. LAE.   He is here today for follow up. He has been doing well. He denies chest pain. LE edema improved on Lasix.   Primary Care Physician: [name]  Last Lipid Profile:Lipid Panel     Component Value Date/Time   CHOL 120 05/18/2013 1153   TRIG 48.0 05/18/2013 1153   HDL 43.00 05/18/2013 1153   CHOLHDL 3 05/18/2013 1153   VLDL 9.6 05/18/2013 1153   LDLCALC 67 05/18/2013 1153    Past Medical History  Diagnosis Date  . CAD (coronary artery disease) 04/08/09    Native vessel s/p overlapoing Xience DES stent mid Circumflex  . Other specified forms of chronic ischemic heart disease   . Acute on chronic systolic heart failure   . COPD (chronic obstructive pulmonary disease) 05/2009  . Hyperlipidemia   . Colon cancer 2008    Colon  . Acute myocardial infarction, unspecified site, episode of care unspecified 10/2008    Past Surgical History  Procedure Laterality Date  . Sigmoid colectomy      Done by Dr. Armandina Gemma  . Tonsillectomy      childhood  . Appendectomy      Current Outpatient Prescriptions  Medication Sig Dispense Refill  . Ascorbic Acid (VITAMIN C) 500 MG tablet Take 500 mg by mouth daily.     Marland Kitchen aspirin 81 MG tablet Take 1 tablet (81 mg total) by mouth daily. 30 tablet 6  . benzonatate (TESSALON) 200 MG capsule TAKE ONE CAPSULE BY  MOUTH EVERY NIGHT AT BEDTIME 30 capsule 0  . Cholecalciferol (VITAMIN D) 2000 UNIT CAPS Take by mouth daily.      . furosemide (LASIX) 20 MG tablet TAKE 2 TABLETS BY MOUTH DAILY OR AS DIRECTED 60 tablet 5  . potassium chloride SA (K-DUR,KLOR-CON) 20 MEQ tablet TAKE 1 TABLET BY MOUTH ONCE DAILY 30 tablet 11  . simvastatin (ZOCOR) 40 MG tablet TAKE 1 TABLET BY MOUTH EVERY NIGHT AT BEDTIME 30 tablet 5  . Spacer/Aero-Holding Chambers (AEROCHAMBER Z-STAT PLUS CHAMBR) MISC by Does not apply route. Use as directed with MDI    . tiotropium (SPIRIVA HANDIHALER) 18 MCG inhalation capsule USE 1 INHALATION BY MOUTH DAILY 30 capsule 6  . vitamin B-12 (CYANOCOBALAMIN) 100 MCG tablet Take 100 mcg by mouth daily.      . vitamin E 400 UNIT capsule Take 400 Units by mouth daily.       No current facility-administered medications for this visit.    No Known Allergies  History   Social History  . Marital Status: Married    Spouse Name: N/A    Number of Children: N/A  . Years of Education: N/A   Occupational History  . Not on file.   Social History Main Topics  . Smoking status: Former Smoker    Types: Cigarettes  . Smokeless  tobacco: Not on file     Comment: Married, lives with spouse. Supportive dtr lives nearby. Retired Curator. 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 Alcohol Use: No  . Drug Use: No  . Sexual Activity: Not on file   Other Topics Concern  . Not on file   Social History Narrative    No family history on file.  Review of Systems:  As stated in the HPI and otherwise negative.   BP 110/60 mmHg  Pulse 64  Ht 5\' 1"  (1.549 m)  Wt 152 lb (68.947 kg)  BMI 28.74 kg/m2  Physical Examination: General: Well developed, well nourished, NAD HEENT: OP clear, mucus membranes moist SKIN: warm, dry. No rashes. Neuro: No focal deficits Musculoskeletal: Muscle strength 5/5 all ext Psychiatric: Mood and affect normal Neck: No JVD,  no carotid bruits, no thyromegaly, no lymphadenopathy. Lungs:Clear bilaterally, no wheezes, rhonci, crackles Cardiovascular: Regular rate and rhythm. No murmurs, gallops or rubs. Abdomen:Soft. Bowel sounds present. Non-tender.  Extremities: 2+ bilateral lower extremity edema.  Echo 06/02/13: Left ventricle: The cavity size was normal. Wall thickness was normal. Systolic function was moderately reduced. The estimated ejection fraction was in the range of 35% to 40%. Regional wall motion abnormalities cannot be excluded. Features are consistent with a pseudonormal left ventricular filling pattern, with concomitant abnormal relaxation and increased filling pressure (grade 2 diastolic dysfunction). Doppler parameters are consistent with elevated mean left atrial filling pressure. - Aortic valve: Mild regurgitation. - Mitral valve: Calcified annulus. Mildly thickened leaflets . Moderate regurgitation directed posteriorly. - Left atrium: The atrium was severely dilated. - Right ventricle: Systolic pressure was increased. - Atrial septum: No defect or patent foramen ovale was identified. - Tricuspid valve: Mild-moderate regurgitation. - Pulmonary arteries: PA peak pressure: 71mm Hg (S).  EKG: NSR, 64 bpm. LVH. Old septal infarct. Lateral T wave inversion  Assessment and Plan:   1. CAD: No recent chest pains. No changes. He is on a statin and ASA. He is not on a beta blocker due to bradycardia.   2. CARDIOMYOPATHY, ISCHEMIC: Stable. He is not on an Ace-inh secondary to renal insufficiency when Ace-inh started and hypotension. He is not on a beta blocker secondary to bradycardia. Not felt to be an ICD candidate.   3. Hyperlipidemia: He is on a statin but muscle aches are severe. Will stop statin. He and his daughter do not wish to restart.   4. Chronic systolic CHF: Continue Lasix 40 mg po QDaily. Volume well controlled.

## 2014-05-24 NOTE — Patient Instructions (Signed)
Your physician wants you to follow-up in:  6 months. You will receive a reminder letter in the mail two months in advance. If you don't receive a letter, please call our office to schedule the follow-up appointment.   

## 2014-05-29 ENCOUNTER — Other Ambulatory Visit: Payer: Self-pay | Admitting: Internal Medicine

## 2014-06-04 ENCOUNTER — Encounter: Payer: Self-pay | Admitting: Podiatry

## 2014-06-04 ENCOUNTER — Ambulatory Visit (INDEPENDENT_AMBULATORY_CARE_PROVIDER_SITE_OTHER): Payer: Medicare Other | Admitting: Podiatry

## 2014-06-04 DIAGNOSIS — M79673 Pain in unspecified foot: Secondary | ICD-10-CM

## 2014-06-04 DIAGNOSIS — B351 Tinea unguium: Secondary | ICD-10-CM

## 2014-06-04 NOTE — Progress Notes (Signed)
Subjective:     Patient ID: Aaron [name], male   DOB: [birth year], 80 y.o.   MRN: [id number]  HPI patient has thick yellow brittle nails 1-5 both feet that he cannot take care of himself and he states that can become tender with shoe gear   Review of Systems     Objective:   Physical Exam Neurovascular status intact with thick yellow brittle nailbeds 1-5 both feet that are painful when pressed dorsally    Assessment:     Mycotic nail infection is with pain 1-5 both feet    Plan:     Debride painful nailbeds 1-5 both feet with no iatrogenic bleeding noted

## 2014-07-24 ENCOUNTER — Encounter: Payer: Medicare Other | Admitting: Internal Medicine

## 2014-09-10 ENCOUNTER — Ambulatory Visit (INDEPENDENT_AMBULATORY_CARE_PROVIDER_SITE_OTHER): Payer: Medicare Other | Admitting: Podiatry

## 2014-09-10 DIAGNOSIS — B351 Tinea unguium: Secondary | ICD-10-CM

## 2014-09-10 DIAGNOSIS — M79673 Pain in unspecified foot: Secondary | ICD-10-CM | POA: Diagnosis not present

## 2014-09-10 NOTE — Progress Notes (Signed)
Subjective:     Patient ID: Aaron [name], male   DOB: January 27, 1925, 81 y.o.   MRN: [id number]  HPI patient has thick yellow brittle nails 1-5 both feet that he cannot take care of himself and he states that can become tender with shoe gear   Review of Systems     Objective:   Physical Exam Neurovascular status intact with thick yellow brittle nailbeds 1-5 both feet that are painful when pressed dorsally    Assessment:     Mycotic nail infection is with pain 1-5 both feet    Plan:     Debride painful nailbeds 1-5 both feet with no iatrogenic bleeding noted

## 2014-10-01 ENCOUNTER — Other Ambulatory Visit: Payer: Self-pay | Admitting: Internal Medicine

## 2014-10-03 ENCOUNTER — Other Ambulatory Visit (INDEPENDENT_AMBULATORY_CARE_PROVIDER_SITE_OTHER): Payer: Medicare Other

## 2014-10-03 ENCOUNTER — Ambulatory Visit (INDEPENDENT_AMBULATORY_CARE_PROVIDER_SITE_OTHER): Payer: Medicare Other | Admitting: Internal Medicine

## 2014-10-03 ENCOUNTER — Encounter: Payer: Self-pay | Admitting: Internal Medicine

## 2014-10-03 VITALS — BP 110/60 | HR 75 | Temp 98.1°F | Ht 61.0 in | Wt 152.0 lb

## 2014-10-03 DIAGNOSIS — J432 Centrilobular emphysema: Secondary | ICD-10-CM

## 2014-10-03 DIAGNOSIS — Z0000 Encounter for general adult medical examination without abnormal findings: Secondary | ICD-10-CM | POA: Diagnosis not present

## 2014-10-03 DIAGNOSIS — E785 Hyperlipidemia, unspecified: Secondary | ICD-10-CM | POA: Diagnosis not present

## 2014-10-03 DIAGNOSIS — Z23 Encounter for immunization: Secondary | ICD-10-CM

## 2014-10-03 LAB — LIPID PANEL
CHOLESTEROL: 159 mg/dL (ref 0–200)
HDL: 34.7 mg/dL — AB (ref >39.00)
LDL Cholesterol: 105 mg/dL — ABNORMAL HIGH (ref 0–99)
NonHDL: 124.3
TRIGLYCERIDES: 96 mg/dL (ref 0.0–149.0)
Total CHOL/HDL Ratio: 5
VLDL: 19.2 mg/dL (ref 0.0–40.0)

## 2014-10-03 MED ORDER — FUROSEMIDE 20 MG PO TABS: 40.0000 mg | ORAL_TABLET | Freq: Every day | ORAL | Status: DC

## 2014-10-03 MED ORDER — TIOTROPIUM BROMIDE MONOHYDRATE 18 MCG IN CAPS: 18.0000 ug | ORAL_CAPSULE | Freq: Every day | RESPIRATORY_TRACT | Status: AC

## 2014-10-03 MED ORDER — POTASSIUM CHLORIDE CRYS ER 20 MEQ PO TBCR: 20.0000 meq | EXTENDED_RELEASE_TABLET | Freq: Every day | ORAL | Status: DC

## 2014-10-03 NOTE — Patient Instructions (Addendum)
It was good to see you today.  We have reviewed your prior records including labs and tests today  Health Maintenance reviewed - all recommended immunizations and age-appropriate screenings are up-to-date.  Test(s) ordered today. Your results will be released to [place] (or called to you) after review, usually within 72hours after test completion. If any changes need to be made, you will be notified at that same time.  Medications reviewed and updated, no changes recommended at this time.  Please schedule followup in 12 months for annual exam and labs, call sooner if problems.  Fall Prevention and Home Safety Falls cause injuries and can affect all age groups. It is possible to use preventive measures to significantly decrease the likelihood of falls. There are many simple measures which can make your home safer and prevent falls. OUTDOORS  Repair cracks and edges of walkways and driveways.  Remove high doorway thresholds.  Trim shrubbery on the main path into your home.  Have good outside lighting.  Clear walkways of tools, rocks, debris, and clutter.  Check that handrails are not broken and are securely fastened. Both sides of steps should have handrails.  Have leaves, snow, and ice cleared regularly.  Use sand or salt on walkways during winter months.  In the garage, clean up grease or oil spills. BATHROOM  Install night lights.  Install grab bars by the toilet and in the tub and shower.  Use non-skid mats or decals in the tub or shower.  Place a plastic non-slip stool in the shower to sit on, if needed.  Keep floors dry and clean up all water on the floor immediately.  Remove soap buildup in the tub or shower on a regular basis.  Secure bath mats with non-slip, double-sided rug tape.  Remove throw rugs and tripping hazards from the floors. BEDROOMS  Install night lights.  Make sure a bedside light is easy to reach.  Do not use oversized bedding.  Keep a  telephone by your bedside.  Have a firm chair with side arms to use for getting dressed.  Remove throw rugs and tripping hazards from the floor. KITCHEN  Keep handles on pots and pans turned toward the center of the stove. Use back burners when possible.  Clean up spills quickly and allow time for drying.  Avoid walking on wet floors.  Avoid hot utensils and knives.  Position shelves so they are not too high or low.  Place commonly used objects within easy reach.  If necessary, use a sturdy step stool with a grab bar when reaching.  Keep electrical cables out of the way.  Do not use floor polish or wax that makes floors slippery. If you must use wax, use non-skid floor wax.  Remove throw rugs and tripping hazards from the floor. STAIRWAYS  Never leave objects on stairs.  Place handrails on both sides of stairways and use them. Fix any loose handrails. Make sure handrails on both sides of the stairways are as long as the stairs.  Check carpeting to make sure it is firmly attached along stairs. Make repairs to worn or loose carpet promptly.  Avoid placing throw rugs at the top or bottom of stairways, or properly secure the rug with carpet tape to prevent slippage. Get rid of throw rugs, if possible.  Have an electrician put in a light switch at the top and bottom of the stairs. OTHER FALL PREVENTION TIPS  Wear low-heel or rubber-soled shoes that are supportive and fit well. Wear  closed toe shoes.  When using a stepladder, make sure it is fully opened and both spreaders are firmly locked. Do not climb a closed stepladder.  Add color or contrast paint or tape to grab bars and handrails in your home. Place contrasting color strips on first and last steps.  Learn and use mobility aids as needed. Install an electrical emergency response system.  Turn on lights to avoid dark areas. Replace light bulbs that burn out immediately. Get light switches that glow.  Arrange furniture  to create clear pathways. Keep furniture in the same place.  Firmly attach carpet with non-skid or double-sided tape.  Eliminate uneven floor surfaces.  Select a carpet pattern that does not visually hide the edge of steps.  Be aware of all pets. OTHER HOME SAFETY TIPS  Set the water temperature for 120 F (48.8 C).  Keep emergency numbers on or near the telephone.  Keep smoke detectors on every level of the home and near sleeping areas. Document Released: 06/12/2002 Document Revised: 12/22/2011 Document Reviewed: 09/11/2011 [facility] Patient Information 2015 [place], [place]. This information is not intended to replace advice given to you by your health care provider. Make sure you discuss any questions you have with your health care provider.

## 2014-10-03 NOTE — Assessment & Plan Note (Signed)
Previously on simva, stopped 05/2014 by cards due to severe myalgia Pt and dtr agree not to resume any statin after discussion of potential risk vs benefit

## 2014-10-03 NOTE — Progress Notes (Signed)
Subjective:    Patient ID: Aaron [name], male    DOB: 27-Jul-1924, 81 y.o.   MRN: [id number]  HPI   Here for medicare wellness  Diet: heart healthy  Physical activity: sedentary Depression/mood screen: negative Hearing: impaired, addressed Visual acuity: grossly normal, performs annual eye exam  ADLs: capable Fall risk: none Home safety: good Cognitive evaluation: intact to orientation, naming, recall and repetition EOL planning: reviewed  I have personally reviewed and have noted 1. The patient's medical and social history 2. Their use of alcohol, tobacco or illicit drugs 3. Their current medications and supplements 4. The patient's functional ability including ADL's, fall risks, home safety risks and hearing or visual impairment. 5. Diet and physical activities 6. Evidence for depression or mood disorders  Also reviewed chronic conditions, interval events and current concerns  Past Medical History  Diagnosis Date  . CAD (coronary artery disease) 04/08/09    Native vessel s/p overlapoing Xience DES stent mid Circumflex  . Other specified forms of chronic ischemic heart disease   . Acute on chronic systolic heart failure   . COPD (chronic obstructive pulmonary disease) 05/2009  . Hyperlipidemia   . Colon cancer 2008    Colon  . Acute myocardial infarction, unspecified site, episode of care unspecified 10/2008   History reviewed. No pertinent family history.   History  Substance Use Topics  . Smoking status: Former Smoker    Types: Cigarettes  . Smokeless tobacco: Not on file  . Alcohol Use: No    Review of Systems  Constitutional: Negative for fever, activity change, appetite change, fatigue and unexpected weight change.  HENT: Positive for hearing loss (chronic).   Respiratory: Negative for cough, chest tightness, shortness of breath and wheezing.   Cardiovascular: Negative for chest pain, palpitations and leg swelling.  Neurological: Negative for  dizziness, weakness and headaches.  Psychiatric/Behavioral: Negative for dysphoric mood. The patient is not nervous/anxious.   All other systems reviewed and are negative.      Objective:    Physical Exam  Constitutional: He appears well-developed and well-nourished. No distress.  Cardiovascular: Normal rate, regular rhythm and normal heart sounds.   No murmur heard. Pulmonary/Chest: Effort normal and breath sounds normal. No respiratory distress.    BP 110/60 mmHg  Pulse 75  Temp(Src) 98.1 F (36.7 C) (Oral)  Ht 5\' 1"  (1.549 m)  Wt 152 lb (68.947 kg)  BMI 28.74 kg/m2  SpO2 95% Wt Readings from Last 3 Encounters:  10/03/14 152 lb (68.947 kg)  05/24/14 152 lb (68.947 kg)  01/17/14 150 lb (68.04 kg)    Lab Results  Component Value Date   WBC 6.6 05/18/2013   HGB 13.6 05/18/2013   HCT 41.1 05/18/2013   PLT 99.0* 05/18/2013   GLUCOSE 128* 11/22/2013   CHOL 120 05/18/2013   TRIG 48.0 05/18/2013   HDL 43.00 05/18/2013   LDLCALC 67 05/18/2013   ALT 17 05/18/2013   AST 21 05/18/2013   NA 139 11/22/2013   K 3.9 11/22/2013   CL 107 11/22/2013   CREATININE 1.6* 11/22/2013   BUN 30* 11/22/2013   CO2 27 11/22/2013   TSH 1.78 05/18/2013   INR 1.1 04/08/2009   HGBA1C  05/03/2007    5.8 (NOTE)   The ADA recommends the following therapeutic goals for glycemic   control related to Hgb A1C measurement:   Goal of Therapy:   < 7.0% Hgb A1C   Action Suggested:  > 8.0% Hgb A1C   Ref:  Diabetes Care, 22, Suppl. 1, 1999    Dg Knee Complete 4 Views Right  05/30/2012   *RADIOLOGY REPORT*  Clinical Data: Right knee pain after twisting injury 3 weeks ago.  RIGHT KNEE - COMPLETE 4+ VIEW  Comparison: None  Findings: Mild medial compartmental degenerative articular space loss, without significant marginal spurring.  Questionable small knee effusion.  IMPRESSION:  1.  I suspect a small knee effusion, although positive predictive value is reduced by the degree of flexion on the lateral  projection. 2.  Minimal degenerative loss of medial compartmental articular space.   Original Report Authenticated By: Van Clines, M.D.       Assessment & Plan:   AWV/z00.00 - Today patient counseled on age appropriate routine health concerns for screening and prevention, each reviewed and up to date or declined. Immunizations reviewed and up to date or declined. Labs ordered and reviewed. Risk factors for depression reviewed and negative. Hearing function and visual acuity are intact. ADLs screened and addressed as needed. Functional ability and level of safety reviewed and appropriate. Education, counseling and referrals performed based on assessed risks today. Patient provided with a copy of personalized plan for preventive services.  Problem List Items Addressed This Visit    COPD (chronic obstructive pulmonary disease) with emphysema    Improved nocturnal pseudo wheeze  Prev on scheduled benzonate and H2B for acid suppression, but not using same Encouraged compliance with daily spiriva and prn albuterol Reviewed pulm meds, no other changes      Relevant Medications   tiotropium (SPIRIVA HANDIHALER) 18 MCG inhalation capsule   Dyslipidemia    Previously on simva, stopped 05/2014 by cards due to severe myalgia Pt and dtr agree not to resume any statin after discussion of potential risk vs benefit      Relevant Orders   Lipid panel    Other Visit Diagnoses    Routine general medical examination at a health care facility    -  Primary    Need for prophylactic vaccination against Streptococcus pneumoniae (pneumococcus)        Relevant Orders    Pneumococcal conjugate vaccine 13-valent (Completed)        [name], MD

## 2014-10-03 NOTE — Assessment & Plan Note (Signed)
Improved nocturnal pseudo wheeze  Prev on scheduled benzonate and H2B for acid suppression, but not using same Encouraged compliance with daily spiriva and prn albuterol Reviewed pulm meds, no other changes

## 2014-10-03 NOTE — Progress Notes (Signed)
Pre visit review using our clinic review tool, if applicable. No additional management support is needed unless otherwise documented below in the visit note. 

## 2014-10-05 ENCOUNTER — Telehealth: Payer: Self-pay

## 2014-10-05 NOTE — Telephone Encounter (Signed)
-----   Message from Rowe Clack, MD sent at 10/04/2014  8:12 AM EDT ----- Please call patient/daughter. Cholesterol is fine. Total 159 and LDL 105. No treatment changes recommended. Please call if questions or concerns

## 2014-10-05 NOTE — Telephone Encounter (Signed)
Talked with melissa/pt daughter and gave lab results and dr leschber's comments 

## 2014-10-31 ENCOUNTER — Other Ambulatory Visit: Payer: Self-pay | Admitting: Internal Medicine

## 2014-12-02 ENCOUNTER — Emergency Department (HOSPITAL_COMMUNITY): Payer: Medicare Other

## 2014-12-02 ENCOUNTER — Inpatient Hospital Stay (HOSPITAL_COMMUNITY)
Admission: EM | Admit: 2014-12-02 | Discharge: 2014-12-05 | DRG: 065 | Disposition: A | Discharge status: Home Health | Payer: Medicare Other | Attending: Internal Medicine | Admitting: Internal Medicine

## 2014-12-02 ENCOUNTER — Encounter (HOSPITAL_COMMUNITY): Payer: Self-pay | Admitting: Emergency Medicine

## 2014-12-02 DIAGNOSIS — J449 Chronic obstructive pulmonary disease, unspecified: Secondary | ICD-10-CM | POA: Diagnosis present

## 2014-12-02 DIAGNOSIS — I252 Old myocardial infarction: Secondary | ICD-10-CM | POA: Diagnosis not present

## 2014-12-02 DIAGNOSIS — N183 Chronic kidney disease, stage 3 unspecified: Secondary | ICD-10-CM | POA: Diagnosis present

## 2014-12-02 DIAGNOSIS — H9190 Unspecified hearing loss, unspecified ear: Secondary | ICD-10-CM | POA: Diagnosis not present

## 2014-12-02 DIAGNOSIS — J432 Centrilobular emphysema: Secondary | ICD-10-CM | POA: Diagnosis not present

## 2014-12-02 DIAGNOSIS — G8191 Hemiplegia, unspecified affecting right dominant side: Secondary | ICD-10-CM | POA: Diagnosis not present

## 2014-12-02 DIAGNOSIS — Z66 Do not resuscitate: Secondary | ICD-10-CM | POA: Diagnosis present

## 2014-12-02 DIAGNOSIS — I255 Ischemic cardiomyopathy: Secondary | ICD-10-CM | POA: Diagnosis present

## 2014-12-02 DIAGNOSIS — Z79899 Other long term (current) drug therapy: Secondary | ICD-10-CM | POA: Diagnosis not present

## 2014-12-02 DIAGNOSIS — I5042 Chronic combined systolic (congestive) and diastolic (congestive) heart failure: Secondary | ICD-10-CM | POA: Diagnosis not present

## 2014-12-02 DIAGNOSIS — Z87820 Personal history of traumatic brain injury: Secondary | ICD-10-CM

## 2014-12-02 DIAGNOSIS — M6281 Muscle weakness (generalized): Secondary | ICD-10-CM | POA: Diagnosis not present

## 2014-12-02 DIAGNOSIS — Z888 Allergy status to other drugs, medicaments and biological substances status: Secondary | ICD-10-CM | POA: Diagnosis not present

## 2014-12-02 DIAGNOSIS — I6789 Other cerebrovascular disease: Secondary | ICD-10-CM | POA: Diagnosis not present

## 2014-12-02 DIAGNOSIS — Z87891 Personal history of nicotine dependence: Secondary | ICD-10-CM | POA: Diagnosis not present

## 2014-12-02 DIAGNOSIS — D696 Thrombocytopenia, unspecified: Secondary | ICD-10-CM | POA: Diagnosis not present

## 2014-12-02 DIAGNOSIS — Z955 Presence of coronary angioplasty implant and graft: Secondary | ICD-10-CM

## 2014-12-02 DIAGNOSIS — Z85038 Personal history of other malignant neoplasm of large intestine: Secondary | ICD-10-CM | POA: Diagnosis not present

## 2014-12-02 DIAGNOSIS — G4710 Hypersomnia, unspecified: Secondary | ICD-10-CM | POA: Diagnosis present

## 2014-12-02 DIAGNOSIS — I639 Cerebral infarction, unspecified: Secondary | ICD-10-CM | POA: Diagnosis present

## 2014-12-02 DIAGNOSIS — I5040 Unspecified combined systolic (congestive) and diastolic (congestive) heart failure: Secondary | ICD-10-CM | POA: Diagnosis not present

## 2014-12-02 DIAGNOSIS — R531 Weakness: Secondary | ICD-10-CM | POA: Diagnosis not present

## 2014-12-02 DIAGNOSIS — Z7951 Long term (current) use of inhaled steroids: Secondary | ICD-10-CM

## 2014-12-02 DIAGNOSIS — J439 Emphysema, unspecified: Secondary | ICD-10-CM | POA: Diagnosis not present

## 2014-12-02 DIAGNOSIS — R0989 Other specified symptoms and signs involving the circulatory and respiratory systems: Secondary | ICD-10-CM

## 2014-12-02 DIAGNOSIS — Z7982 Long term (current) use of aspirin: Secondary | ICD-10-CM | POA: Diagnosis not present

## 2014-12-02 DIAGNOSIS — M6289 Other specified disorders of muscle: Secondary | ICD-10-CM | POA: Diagnosis not present

## 2014-12-02 DIAGNOSIS — E785 Hyperlipidemia, unspecified: Secondary | ICD-10-CM | POA: Diagnosis not present

## 2014-12-02 DIAGNOSIS — I2510 Atherosclerotic heart disease of native coronary artery without angina pectoris: Secondary | ICD-10-CM | POA: Diagnosis present

## 2014-12-02 DIAGNOSIS — I63412 Cerebral infarction due to embolism of left middle cerebral artery: Secondary | ICD-10-CM | POA: Diagnosis not present

## 2014-12-02 DIAGNOSIS — I517 Cardiomegaly: Secondary | ICD-10-CM | POA: Diagnosis not present

## 2014-12-02 LAB — CBC
HEMATOCRIT: 41.6 % (ref 39.0–52.0)
HEMOGLOBIN: 14.1 g/dL (ref 13.0–17.0)
MCH: 31.4 pg (ref 26.0–34.0)
MCHC: 33.9 g/dL (ref 30.0–36.0)
MCV: 92.7 fL (ref 78.0–100.0)
Platelets: 91 10*3/uL — ABNORMAL LOW (ref 150–400)
RBC: 4.49 MIL/uL (ref 4.22–5.81)
RDW: 14.3 % (ref 11.5–15.5)
WBC: 7 10*3/uL (ref 4.0–10.5)

## 2014-12-02 LAB — DIFFERENTIAL
Basophils Absolute: 0 10*3/uL (ref 0.0–0.1)
Basophils Relative: 0 % (ref 0–1)
EOS ABS: 0.1 10*3/uL (ref 0.0–0.7)
Eosinophils Relative: 1 % (ref 0–5)
LYMPHS ABS: 1.7 10*3/uL (ref 0.7–4.0)
LYMPHS PCT: 24 % (ref 12–46)
MONOS PCT: 14 % — AB (ref 3–12)
Monocytes Absolute: 1 10*3/uL (ref 0.1–1.0)
Neutro Abs: 4.2 10*3/uL (ref 1.7–7.7)
Neutrophils Relative %: 60 % (ref 43–77)

## 2014-12-02 LAB — COMPREHENSIVE METABOLIC PANEL
ALT: 15 U/L — ABNORMAL LOW (ref 17–63)
AST: 22 U/L (ref 15–41)
Albumin: 2.9 g/dL — ABNORMAL LOW (ref 3.5–5.0)
Alkaline Phosphatase: 97 U/L (ref 38–126)
Anion gap: 9 (ref 5–15)
BUN: 27 mg/dL — ABNORMAL HIGH (ref 6–20)
CHLORIDE: 104 mmol/L (ref 101–111)
CO2: 24 mmol/L (ref 22–32)
Calcium: 8.3 mg/dL — ABNORMAL LOW (ref 8.9–10.3)
Creatinine, Ser: 1.68 mg/dL — ABNORMAL HIGH (ref 0.61–1.24)
GFR calc Af Amer: 41 mL/min — ABNORMAL LOW (ref >60)
GFR calc non Af Amer: 35 mL/min — ABNORMAL LOW (ref >60)
GLUCOSE: 98 mg/dL (ref 65–99)
Potassium: 4.3 mmol/L (ref 3.5–5.1)
Sodium: 137 mmol/L (ref 135–145)
Total Bilirubin: 0.5 mg/dL (ref 0.3–1.2)
Total Protein: 6.1 g/dL — ABNORMAL LOW (ref 6.5–8.1)

## 2014-12-02 LAB — I-STAT CHEM 8, ED
BUN: 28 mg/dL — ABNORMAL HIGH (ref 6–20)
Calcium, Ion: 1.15 mmol/L (ref 1.13–1.30)
Chloride: 104 mmol/L (ref 101–111)
Creatinine, Ser: 1.6 mg/dL — ABNORMAL HIGH (ref 0.61–1.24)
GLUCOSE: 99 mg/dL (ref 65–99)
HCT: 45 % (ref 39.0–52.0)
Hemoglobin: 15.3 g/dL (ref 13.0–17.0)
Potassium: 4.2 mmol/L (ref 3.5–5.1)
Sodium: 140 mmol/L (ref 135–145)
TCO2: 21 mmol/L (ref 0–100)

## 2014-12-02 LAB — I-STAT TROPONIN, ED: TROPONIN I, POC: 0.01 ng/mL (ref 0.00–0.08)

## 2014-12-02 LAB — URINALYSIS, ROUTINE W REFLEX MICROSCOPIC
Bilirubin Urine: NEGATIVE
GLUCOSE, UA: NEGATIVE mg/dL
Hgb urine dipstick: NEGATIVE
KETONES UR: NEGATIVE mg/dL
LEUKOCYTES UA: NEGATIVE
Nitrite: NEGATIVE
Protein, ur: NEGATIVE mg/dL
Specific Gravity, Urine: 1.006 (ref 1.005–1.030)
Urobilinogen, UA: 0.2 mg/dL (ref 0.0–1.0)
pH: 5 (ref 5.0–8.0)

## 2014-12-02 LAB — APTT: aPTT: 29 seconds (ref 24–37)

## 2014-12-02 LAB — PROTIME-INR
INR: 1.06 (ref 0.00–1.49)
PROTHROMBIN TIME: 14 s (ref 11.6–15.2)

## 2014-12-02 LAB — ETHANOL: Alcohol, Ethyl (B): <5 mg/dL (ref <5)

## 2014-12-02 MED ORDER — SENNOSIDES-DOCUSATE SODIUM 8.6-50 MG PO TABS: 1.0000 | ORAL_TABLET | Freq: Every evening | ORAL | Status: DC | PRN

## 2014-12-02 MED ORDER — ASPIRIN EC 325 MG PO TBEC
325.0000 mg | DELAYED_RELEASE_TABLET | Freq: Every day | ORAL | Status: DC
  Administered 2014-12-02 – 2014-12-05 (×4): 325 mg via ORAL
  Filled 2014-12-02 (×4): qty 1

## 2014-12-02 MED ORDER — STROKE: EARLY STAGES OF RECOVERY BOOK
Freq: Once | Status: AC
  Administered 2014-12-02: 16:00:00
  Filled 2014-12-02: qty 1

## 2014-12-02 MED ORDER — TIOTROPIUM BROMIDE MONOHYDRATE 18 MCG IN CAPS
18.0000 ug | ORAL_CAPSULE | Freq: Every day | RESPIRATORY_TRACT | Status: DC
Start: 2014-12-02 — End: 2014-12-05
  Administered 2014-12-03 – 2014-12-04 (×2): 18 ug via RESPIRATORY_TRACT
  Filled 2014-12-02 (×2): qty 5

## 2014-12-02 MED ORDER — ASPIRIN 300 MG RE SUPP
300.0000 mg | Freq: Every day | RECTAL | Status: DC
  Filled 2014-12-02: qty 1

## 2014-12-02 NOTE — Progress Notes (Signed)
Patient really wants to remove his TELE, Karen Chafe appears to be making patient more agitated. Family wants to to have TELE remove. Dr. Latrelle Dodrill aware that patient/family refused TELE.  Ave Filter, RN

## 2014-12-02 NOTE — ED Provider Notes (Signed)
CSN: [id number]     Arrival date & time 12/02/14  1016 History   First MD Initiated Contact with Patient 12/02/14 1018     Chief Complaint  Patient presents with  . Extremity Weakness     (Consider location/radiation/quality/duration/timing/severity/associated sxs/prior Treatment) HPI   Level V Caveat- pt is mostly deaf  PCP: [name], MD Blood pressure 117/56, pulse 83, temperature 99 F (37.2 C), temperature source Oral, resp. rate 15, SpO2 94 %.  Aaron [name] is a 81 y.o.male with a significant PMH of CAD, COPD, MI, colon cancer 2008 presents to the ER with complaints of weakness brought in by EMS. He was last seen normal at 11 pm last night. This morning when he woke up he was having difficulty using his right arm (he is right handed) and difficulty using his right leg. His daughter and wife who are here report that he has deficits to this side at baseline due to an injury 40 years ago but that this is much more significant. He denies having pain anywhere. The deficits are still present and have only mildly improved to the right arm.  Negative Review of Symptoms: Denies pain, cough, dysuria, SOB, headache, abdominal pain, N/V/D.    Past Medical History  Diagnosis Date  . CAD (coronary artery disease) 04/08/09    Native vessel s/p overlapoing Xience DES stent mid Circumflex  . Other specified forms of chronic ischemic heart disease   . Acute on chronic systolic heart failure   . COPD (chronic obstructive pulmonary disease) 05/2009  . Hyperlipidemia   . Colon cancer 2008    Colon  . Acute myocardial infarction, unspecified site, episode of care unspecified 10/2008   Past Surgical History  Procedure Laterality Date  . Sigmoid colectomy      Done by Dr. Armandina Gemma  . Tonsillectomy      childhood  . Appendectomy     History reviewed. No pertinent family history. History  Substance Use Topics  . Smoking status: Former Smoker    Types: Cigarettes  . Smokeless  tobacco: Not on file  . Alcohol Use: No    Review of Systems  Level V Caveat- pt is mostly deaf  Allergies  Statins  Home Medications   Prior to Admission medications   Medication Sig Start Date End Date Taking? Authorizing Provider  Ascorbic Acid (VITAMIN C) 500 MG tablet Take 500 mg by mouth daily.     Historical Provider, MD  aspirin 81 MG tablet Take 1 tablet (81 mg total) by mouth daily. 11/30/12   Burnell Blanks, MD  Cholecalciferol (VITAMIN D) 2000 UNIT CAPS Take by mouth daily.      Historical Provider, MD  furosemide (LASIX) 20 MG tablet Take 2 tablets (40 mg total) by mouth daily. Or as directed 10/03/14   Rowe Clack, MD  furosemide (LASIX) 20 MG tablet TAKE 2 TABLETS BY MOUTH EVERY DAY OR AS DIRECTED 10/31/14   Rowe Clack, MD  potassium chloride SA (K-DUR,KLOR-CON) 20 MEQ tablet Take 1 tablet (20 mEq total) by mouth daily. 10/03/14   Rowe Clack, MD  Spacer/Aero-Holding Chambers (AEROCHAMBER Z-STAT PLUS [facility]) [place] by Does not apply route. Use as directed with MDI    Historical Provider, MD  tiotropium (SPIRIVA HANDIHALER) 18 MCG inhalation capsule Place 1 capsule (18 mcg total) into inhaler and inhale daily. 10/03/14   Rowe Clack, MD  vitamin B-12 (CYANOCOBALAMIN) 100 MCG tablet Take 100 mcg by mouth daily.  Historical Provider, MD  vitamin E 400 UNIT capsule Take 400 Units by mouth daily.      Historical Provider, MD   BP 124/57 mmHg  Pulse 72  Temp(Src) 99 F (37.2 C) (Oral)  Resp 18  SpO2 94% Physical Exam  Constitutional: He appears well-developed and well-nourished. No distress.  HENT:  Head: Normocephalic and atraumatic.  Eyes: Conjunctivae and EOM are normal. Pupils are equal, round, and reactive to light. No scleral icterus.  Neck: Normal range of motion. Neck supple.  Cardiovascular: Normal rate, regular rhythm, normal heart sounds and intact distal pulses.   Pulmonary/Chest: Effort normal. No respiratory distress. He  has no wheezes. He has rhonchi. He has no rales. He exhibits no tenderness.  Abdominal: Soft. There is no tenderness.  Musculoskeletal: He exhibits no edema or tenderness.  Neurological: He is alert. He displays a negative Romberg sign.  No facial paralysis or slurring of speech, patient is alert and oriented to self. Unable to asses cranial nerves d/t pts difficult hearing.  He is unable to elevate the right leg. Left leg normal strength. He is able to elevate both arms and hold but is unable to do finger to nose with the right arm.   Skin: Skin is warm and dry. No petechiae, no purpura and no rash noted. He is not diaphoretic.  Psychiatric: His speech is not slurred. Cognition and memory are impaired.  Nursing note and vitals reviewed.   ED Course  Procedures (including critical care time) Labs Review Labs Reviewed  CBC - Abnormal; Notable for the following:    Platelets 91 (*)    All other components within normal limits  DIFFERENTIAL - Abnormal; Notable for the following:    Monocytes Relative 14 (*)    All other components within normal limits  COMPREHENSIVE METABOLIC PANEL - Abnormal; Notable for the following:    BUN 27 (*)    Creatinine, Ser 1.68 (*)    Calcium 8.3 (*)    Total Protein 6.1 (*)    Albumin 2.9 (*)    ALT 15 (*)    GFR calc non Af Amer 35 (*)    GFR calc Af Amer 41 (*)    All other components within normal limits  I-STAT CHEM 8, ED - Abnormal; Notable for the following:    BUN 28 (*)    Creatinine, Ser 1.60 (*)    All other components within normal limits  ETHANOL  PROTIME-INR  APTT  URINALYSIS, ROUTINE W REFLEX MICROSCOPIC (NOT AT [facility])  I-STAT TROPOININ, ED    Imaging Review Dg Chest 2 View  12/02/2014   CLINICAL DATA:  Woke up this morning with right arm and hand weakness, felt like it was numb. EMS called to house for fall, which was a buckling of right leg; went down to right knee, but did not fall to floor. Last seen normal 11pm last night.  Able to walk, but right leg weaker. Alert and oriented on arrival; very hard of hearing. Denies pain.  EXAM: CHEST - 2 VIEW  COMPARISON:  08/13/2009  FINDINGS: Lungs are clear. Mild cardiomegaly. Atheromatous aorta. Partially calcified pleural plaques. No pneumothorax. No effusion. Spurring in the lower thoracic spine.  IMPRESSION: Mild cardiomegaly.   Electronically Signed   By: Lucrezia Europe M.D.   On: 12/02/2014 12:03   Ct Head Wo Contrast  12/02/2014   CLINICAL DATA:  Right arm weakness starting this morning  EXAM: CT HEAD WITHOUT CONTRAST  TECHNIQUE: Contiguous axial images were  obtained from the base of the skull through the vertex without intravenous contrast.  COMPARISON:  None.  FINDINGS: No skull fracture is noted. Paranasal sinuses and mastoid air cells are unremarkable. Mild atherosclerotic calcifications of carotid siphon.  No intracranial hemorrhage, mass effect or midline shift.  Moderate cerebral atrophy. Mild periventricular white matter decreased attenuation probable due to chronic small vessel ischemic changes. Moderate cerebral atrophy. No acute cortical infarction. No mass lesion is noted on this unenhanced scan.  IMPRESSION: No acute intracranial abnormality. Moderate cerebral atrophy. Mild periventricular white matter decreased attenuation probable due to chronic small vessel ischemic changes. No definite acute cortical infarction.   Electronically Signed   By: Lahoma Crocker M.D.   On: 12/02/2014 10:59     EKG Interpretation   Date/Time:  Sunday Dec 02 2014 10:23:53 EDT Ventricular Rate:  78 PR Interval:  192 QRS Duration: 94 QT Interval:  419 QTC Calculation: 477 R Axis:   40 Text Interpretation:  Sinus rhythm Atrial premature complex Borderline low  voltage, extremity leads Anteroseptal infarct, old Nonspecific T  abnormalities, inferior leads T wave changes similar to 2010 Confirmed by  GOLDSTON  MD, [place] ([id number]) on 12/02/2014 10:33:25 AM      MDM   Final diagnoses:   Rhonchi  Right sided weakness  Chronic obstructive pulmonary disease, unspecified COPD, unspecified chronic bronchitis type   10: 52 AM Dr. Colin Mulders aware that patients last seen normal was 11 pm last night and that pt still has right leg and arm deficits.   11: 25 AM Neurology has agreed to consult, head CT is negative.  1:07   PM Pt admitted, admitting provider will put in holding orders.  Filed Vitals:   12/02/14 1205  BP: 124/57  Pulse: 72  Temp:   Resp: [address] PA-C 12/02/14 [place], MD 12/03/14 [phone]

## 2014-12-02 NOTE — ED Notes (Signed)
Patient returned from X-ray 

## 2014-12-02 NOTE — ED Notes (Signed)
Patient transported to MRI 

## 2014-12-02 NOTE — ED Notes (Signed)
Patient transported to CT 

## 2014-12-02 NOTE — Consult Note (Signed)
Stroke Consult    Chief Complaint:  HPI: Aaron [name] is an 81 y.o. male hx of CAD, MI presenting to the ED with concerns of right sided weakness. LSW 2300 on 5/28. Woke up this morning and noted right sided weakness. 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 Symptoms have been slowly improving but he is not back to his baseline at this time.  MRI brain imaging reviewed and shows acute infarct in the left MCA territory.   Past Medical History  Diagnosis Date  . CAD (coronary artery disease) 04/08/09    Native vessel s/p overlapoing Xience DES stent mid Circumflex  . Other specified forms of chronic ischemic heart disease   . Acute on chronic systolic heart failure   . COPD (chronic obstructive pulmonary disease) 05/2009  . Hyperlipidemia   . Colon cancer 2008    Colon  . Acute myocardial infarction, unspecified site, episode of care unspecified 10/2008    Past Surgical History  Procedure Laterality Date  . Sigmoid colectomy      Done by Dr. Armandina Gemma  . Tonsillectomy      childhood  . Appendectomy      History reviewed. No pertinent family history. Social History:  reports that he has quit smoking. His smoking use included Cigarettes. He does not have any smokeless tobacco history on file. He reports that he does not drink alcohol or use illicit drugs.  Allergies:  Allergies  Allergen Reactions  . Statins Other (See Comments)    Severe myalgias     (Not in a hospital admission)  ROS: Out of a complete 14 system review, the patient complains of only the following symptoms, and all other reviewed systems are negative. + weakness  Physical Examination: Filed Vitals:   12/02/14 1300  BP: 132/67  Pulse: 71  Temp:   Resp: 23   Physical Exam  Constitutional: He appears well-developed and well-nourished.  Psych: Affect appropriate to situation Eyes: No scleral injection HENT: No OP obstrucion Head:  Normocephalic.  Cardiovascular: Normal rate and regular rhythm.  Respiratory: Effort normal and breath sounds normal.  GI: Soft. Bowel sounds are normal. No distension. There is no tenderness.  Skin: WDI   Neurologic Examination: Mental Status: Alert, oriented to name only. Limited verbal output, no dysarthria noted.  Intermittently follows one step commands, not following multi-step commands Cranial Nerves: II: optic discs not visualized due to patient cooperation, blinks to threat bilaterally, pupils equal, round, reactive to light  III,IV, VI: ptosis not present, extra-ocular motions intact bilaterally V,VII: right facial droop, facial light touch sensation normal bilaterally VIII: decreased hearing bilaterally IX,X: gag reflex present XI: trapezius strength/neck flexion strength normal bilaterally XII: tongue strength normal  Motor: Unable to formally test due to patient not following commands but noted weakness on right side LE>UE compared to left side. Tone and bulk:normal tone throughout; no atrophy noted Sensory: LT intact in all extremities Deep Tendon Reflexes: 2+ and symmetric throughout Plantars: Right: downgoing   Left: downgoing Cerebellar: Patient not following commands Gait: deferred  Laboratory Studies:   Basic Metabolic Panel:  Recent Labs Lab 12/02/14 1114 12/02/14 1132  NA 137 140  K 4.3 4.2  CL 104 104  CO2 24  --   GLUCOSE 98 99  BUN 27* 28*  CREATININE 1.68* 1.60*  CALCIUM 8.3*  --     Liver Function Tests:  Recent Labs Lab 12/02/14 1114  AST 22  ALT 15*  ALKPHOS 97  BILITOT 0.5  PROT 6.1*  ALBUMIN 2.9*   No results for input(s): LIPASE, AMYLASE in the last 168 hours. No results for input(s): AMMONIA in the last 168 hours.  CBC:  Recent Labs Lab 12/02/14 1114 12/02/14 1132  WBC 7.0  --   NEUTROABS 4.2  --   HGB 14.1 15.3  HCT 41.6 45.0  MCV 92.7  --   PLT 91*  --     Cardiac Enzymes: No results for input(s):  CKTOTAL, CKMB, CKMBINDEX, TROPONINI in the last 168 hours.  BNP: Invalid input(s): POCBNP  CBG: No results for input(s): GLUCAP in the last 168 hours.  Microbiology: Results for orders placed or performed during the hospital encounter of 04/05/09  Urine culture     Status: None   Collection Time: 04/05/09  1:53 PM  Result Value Ref Range Status   Specimen Description URINE, CLEAN CATCH  Final   Special Requests NONE  Final   Colony Count NO GROWTH  Final   Culture NO GROWTH  Final   Report Status 04/07/2009 FINAL  Final    Coagulation Studies:  Recent Labs  12/02/14 1114  LABPROT 14.0  INR 1.06    Urinalysis:  Recent Labs Lab 12/02/14 1114  COLORURINE YELLOW  LABSPEC 1.006  PHURINE 5.0  GLUCOSEU NEGATIVE  HGBUR NEGATIVE  BILIRUBINUR NEGATIVE  KETONESUR NEGATIVE  PROTEINUR NEGATIVE  UROBILINOGEN 0.2  NITRITE NEGATIVE  LEUKOCYTESUR NEGATIVE    Lipid Panel:     Component Value Date/Time   CHOL 159 10/03/2014 0944   TRIG 96.0 10/03/2014 0944   TRIG 43 04/06/2009   HDL 34.70* 10/03/2014 0944   CHOLHDL 5 10/03/2014 0944   VLDL 19.2 10/03/2014 0944   LDLCALC 105* 10/03/2014 0944    HgbA1C:  Lab Results  Component Value Date   HGBA1C  05/03/2007    5.8 (NOTE)   The ADA recommends the following therapeutic goals for glycemic   control related to Hgb A1C measurement:   Goal of Therapy:   < 7.0% Hgb A1C   Action Suggested:  > 8.0% Hgb A1C   Ref:  Diabetes Care, 22, Suppl. 1, 1999    Urine Drug Screen:  No results found for: LABOPIA, COCAINSCRNUR, LABBENZ, AMPHETMU, THCU, LABBARB  Alcohol Level:  Recent Labs Lab 12/02/14 [address] <5    Imaging: Dg Chest 2 View  12/02/2014   CLINICAL DATA:  Woke up this morning with right arm and hand weakness, felt like it was numb. EMS called to house for fall, which was a buckling of right leg; went down to right knee, but did not fall to floor. Last seen normal 11pm last night. Able to walk, but right leg weaker.  Alert and oriented on arrival; very hard of hearing. Denies pain.  EXAM: CHEST - 2 VIEW  COMPARISON:  08/13/2009  FINDINGS: Lungs are clear. Mild cardiomegaly. Atheromatous aorta. Partially calcified pleural plaques. No pneumothorax. No effusion. Spurring in the lower thoracic spine.  IMPRESSION: Mild cardiomegaly.   Electronically Signed   By: Lucrezia Europe M.D.   On: 12/02/2014 12:03   Ct Head Wo Contrast  12/02/2014   CLINICAL DATA:  Right arm weakness starting this morning  EXAM: CT HEAD WITHOUT CONTRAST  TECHNIQUE: Contiguous axial images were obtained from the base of the skull through the vertex without intravenous contrast.  COMPARISON:  None.  FINDINGS: No skull fracture is noted. Paranasal sinuses and mastoid air cells are  unremarkable. Mild atherosclerotic calcifications of carotid siphon.  No intracranial hemorrhage, mass effect or midline shift.  Moderate cerebral atrophy. Mild periventricular white matter decreased attenuation probable due to chronic small vessel ischemic changes. Moderate cerebral atrophy. No acute cortical infarction. No mass lesion is noted on this unenhanced scan.  IMPRESSION: No acute intracranial abnormality. Moderate cerebral atrophy. Mild periventricular white matter decreased attenuation probable due to chronic small vessel ischemic changes. No definite acute cortical infarction.   Electronically Signed   By: Lahoma Crocker M.D.   On: 12/02/2014 10:59    Assessment: 81 y.o. male with history of CAD, MI presenting to ED after waking up with right sided weakness. LSW 2300 on 5/28. Symptoms have been gradually improving but he is not fully back to his baseline. Exam limited due to cooperation but shows asymmetric weakness on the right side compared to left. Admitted for stroke/TIA workup.    Plan: 1. HgbA1c, fasting lipid panel 2. MRI, MRA  of the brain without contrast completed 3. PT consult, OT consult, Speech consult 4. Echocardiogram 5. Carotid dopplers 6.  Prophylactic therapy-ASA 325mg  daily 7. Risk factor modification 8. Telemetry monitoring 9. Frequent neuro checks 10. NPO until RN stroke swallow screen   Jim Like, DO [facility] [phone]  If 7pm- 7am, please page neurology on call as listed in [place]. 12/02/2014, 1:44 PM

## 2014-12-02 NOTE — ED Notes (Signed)
XRay returned to transport patient back to department after cleaning.

## 2014-12-02 NOTE — ED Notes (Signed)
Woke up this morning with right arm and hand weakness, felt like it was numb. EMS called to house for fall, which was a buckling of right leg; went down to right knee, but did not fall to floor. Last seen normal 11pm last night. Able to walk, but right leg weaker. Alert and oriented on arrival; very hard of hearing. Denies pain.

## 2014-12-02 NOTE — H&P (Signed)
[facility] History and Physical                                                                                    Aaron [name], is a 81 y.o. male  MRN: [id number]   DOB - Oct 31, 1924  Admit Date - 12/02/2014  Outpatient Primary MD for the patient is [name], MD  Referring MD: Regenia Skeeter / ER  Consulting MD: Janann Colonel / Neurology  With History of -  Past Medical History  Diagnosis Date  . CAD (coronary artery disease) 04/08/09    Native vessel s/p overlapoing Xience DES stent mid Circumflex  . Other specified forms of chronic ischemic heart disease   . Acute on chronic systolic heart failure   . COPD (chronic obstructive pulmonary disease) 05/2009  . Hyperlipidemia   . Colon cancer 2008    Colon  . Acute myocardial infarction, unspecified site, episode of care unspecified 10/2008      Past Surgical History  Procedure Laterality Date  . Sigmoid colectomy      Done by Dr. Armandina Gemma  . Tonsillectomy      childhood  . Appendectomy      in for   Chief Complaint  Patient presents with  . 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 Also history of ischemic cardiomyopathy with combined systolic and diastolic heart failure with an EF of 35%, COPD and dyslipidemia. Brought to ER today by family because wife noticed worsening of right-sided deficits from baseline primarily the right hand from being very floppy this morning. She noticed symptoms around 7:30 AM but did not tell her daughter until 51 AM. In addition the patient fell when attempting to ambulate earlier this morning. No recent acute illnesses such as nausea vomiting diarrhea although patient has developed what is described as a "bad cough" recently but daughter unsure exactly when began. Patient reports has a good appetite.  In ER patient was mildly febrile with a  temperature of 99.3 but otherwise was hemodynamically stable, was on oxygen at 2 L/m saturating 96%. CT of the head unremarkable as was chest x-ray. Renal function at baseline with BUN 28 and creatinine 1.6. Hemoglobin stable at 15, no leukocytosis, patient did have mild thrombus cytopenia platelets 91,000. Urinalysis was unremarkable, glucose 99, call less than 5. Neurologist evaluation pending.   Review of Systems   In addition to the HPI above,  No Fever-chills, myalgias or other constitutional symptoms No Headache, changes with Vision or hearing No problems swallowing food or Liquids, indigestion/reflux No Chest pain, palpitations, orthopnea or DOE No Abdominal pain, N/V; no melena or hematochezia, no dark tarry stools, Bowel movements are regular, No dysuria, hematuria or flank pain No new skin rashes, lesions, masses or bruises, No new joints pains-aches No recent weight gain or loss No polyuria, polydypsia or polyphagia,  *A full 10 point Review of Systems was done, except as stated above, all other Review of Systems were negative.  Social History History  Substance Use Topics  . Smoking status: Former  Smoker    Types: Cigarettes  . Smokeless tobacco: Not on file  . Alcohol Use: No    Resides at: Private residence  Lives with: Wife  Ambulatory status: w/o assistive devices   Family History Daughter at bedside and unclear of family medical history regarding patient's mother father or brother; patient very hard of hearing and was unable to contribute to history   Prior to Admission medications   Medication Sig Start Date End Date Taking? Authorizing Provider  Ascorbic Acid (VITAMIN C) 500 MG tablet Take 500 mg by mouth daily.    Yes Historical Provider, MD  aspirin 81 MG tablet Take 1 tablet (81 mg total) by mouth daily. 11/30/12  Yes Burnell Blanks, MD  Cholecalciferol (VITAMIN D) 2000 UNIT CAPS Take 2,000 Units by mouth daily.    Yes Historical Provider, MD    furosemide (LASIX) 20 MG tablet Take 2 tablets (40 mg total) by mouth daily. Or as directed 10/03/14  Yes Rowe Clack, MD  potassium chloride SA (K-DUR,KLOR-CON) 20 MEQ tablet Take 1 tablet (20 mEq total) by mouth daily. 10/03/14  Yes Rowe Clack, MD  tiotropium (SPIRIVA HANDIHALER) 18 MCG inhalation capsule Place 1 capsule (18 mcg total) into inhaler and inhale daily. 10/03/14  Yes Rowe Clack, MD  vitamin B-12 (CYANOCOBALAMIN) 100 MCG tablet Take 100 mcg by mouth daily.     Yes Historical Provider, MD  vitamin E 400 UNIT capsule Take 400 Units by mouth daily.     Yes Historical Provider, MD  Spacer/Aero-Holding Chambers (AEROCHAMBER Z-STAT PLUS CHAMBR) MISC by Does not apply route. Use as directed with MDI    Historical Provider, MD    Allergies  Allergen Reactions  . Statins Other (See Comments)    Severe myalgias    Physical Exam  Vitals  Blood pressure 124/57, pulse 72, temperature 99 F (37.2 C), temperature source Oral, resp. rate 18, SpO2 94 %.   General:  In no acute distress, appears healthy and well nourished  Psych:  Normal affect, Denies Suicidal or Homicidal ideations, Awake Alert, Oriented X 3. Speech and thought patterns are clear and appropriate, no apparent short term memory deficits  Neuro: CN II through XII intact, Strength 5/5 except for right upper extremity : Able to lift off bed for short period of time, unable to flex, strength about 2/5 noting patient having difficulty following commands secondary to hearing loss and difficulty understanding commands ;Sensation intact all 4 extremities. Mildly hyperreflexive upon testing left patellar  ENT:  Ears and Eyes appear Normal, Conjunctivae clear, PER. Moist oral mucosa without erythema or exudates. Markedly hearing-impaired  Neck:  Supple, No lymphadenopathy appreciated  Respiratory:  Symmetrical chest wall movement, Good air movement bilaterally, CTAB. Room Air  Cardiac:  RRR, No Murmurs, no  LE edema noted, no JVD, No carotid bruits, peripheral pulses palpable at 2+  Abdomen:  Positive bowel sounds, Soft, Non tender, Non distended,  No masses appreciated, no obvious hepatosplenomegaly  Skin:  No Cyanosis, Normal Skin Turgor, No Skin Rash or Bruise.  Extremities: Symmetrical without obvious trauma or injury,  no effusions.  Data Review  CBC  Recent Labs Lab 12/02/14 1114 12/02/14 1132  WBC 7.0  --   HGB 14.1 15.3  HCT 41.6 45.0  PLT 91*  --   MCV 92.7  --   MCH 31.4  --   MCHC 33.9  --   RDW 14.3  --   LYMPHSABS 1.7  --   MONOABS 1.0  --  EOSABS 0.1  --   BASOSABS 0.0  --     Chemistries   Recent Labs Lab 12/02/14 1114 12/02/14 1132  NA 137 140  K 4.3 4.2  CL 104 104  CO2 24  --   GLUCOSE 98 99  BUN 27* 28*  CREATININE 1.68* 1.60*  CALCIUM 8.3*  --   AST 22  --   ALT 15*  --   ALKPHOS 97  --   BILITOT 0.5  --     CrCl cannot be calculated (Unknown ideal weight.).  No results for input(s): TSH, T4TOTAL, T3FREE, THYROIDAB in the last 72 hours.  Invalid input(s): FREET3  Coagulation profile  Recent Labs Lab 12/02/14 1114  INR 1.06    No results for input(s): DDIMER in the last 72 hours.  Cardiac Enzymes No results for input(s): CKMB, TROPONINI, MYOGLOBIN in the last 168 hours.  Invalid input(s): CK  Invalid input(s): POCBNP  Urinalysis    Component Value Date/Time   COLORURINE YELLOW 12/02/2014 [place] 12/02/2014 1114   LABSPEC 1.006 12/02/2014 1114   PHURINE 5.0 12/02/2014 1114   GLUCOSEU NEGATIVE 12/02/2014 1114   [place] 12/02/2014 [place] 12/02/2014 1114   [place] 12/02/2014 1114   PROTEINUR NEGATIVE 12/02/2014 1114   UROBILINOGEN 0.2 12/02/2014 1114   NITRITE NEGATIVE 12/02/2014 1114   LEUKOCYTESUR NEGATIVE 12/02/2014 1114    Imaging results:   Dg Chest 2 View  12/02/2014   CLINICAL DATA:  Woke up this morning with right arm and hand weakness, felt like  it was numb. EMS called to house for fall, which was a buckling of right leg; went down to right knee, but did not fall to floor. Last seen normal 11pm last night. Able to walk, but right leg weaker. Alert and oriented on arrival; very hard of hearing. Denies pain.  EXAM: CHEST - 2 VIEW  COMPARISON:  08/13/2009  FINDINGS: Lungs are clear. Mild cardiomegaly. Atheromatous aorta. Partially calcified pleural plaques. No pneumothorax. No effusion. Spurring in the lower thoracic spine.  IMPRESSION: Mild cardiomegaly.   Electronically Signed   By: Lucrezia Europe M.D.   On: 12/02/2014 12:03   Ct Head Wo Contrast  12/02/2014   CLINICAL DATA:  Right arm weakness starting this morning  EXAM: CT HEAD WITHOUT CONTRAST  TECHNIQUE: Contiguous axial images were obtained from the base of the skull through the vertex without intravenous contrast.  COMPARISON:  None.  FINDINGS: No skull fracture is noted. Paranasal sinuses and mastoid air cells are unremarkable. Mild atherosclerotic calcifications of carotid siphon.  No intracranial hemorrhage, mass effect or midline shift.  Moderate cerebral atrophy. Mild periventricular white matter decreased attenuation probable due to chronic small vessel ischemic changes. Moderate cerebral atrophy. No acute cortical infarction. No mass lesion is noted on this unenhanced scan.  IMPRESSION: No acute intracranial abnormality. Moderate cerebral atrophy. Mild periventricular white matter decreased attenuation probable due to chronic small vessel ischemic changes. No definite acute cortical infarction.   Electronically Signed   By: Lahoma Crocker M.D.   On: 12/02/2014 10:59     EKG: (Independently reviewed) sinus rhythm with occasional PAC, chronic T-wave inversion in inferolateral leads and unchanged from previous EKGs   Assessment & Plan  Principal Problem:   CVA  -Admit to neuro telemetry -Await neurologist evaluation -Stat MRI/MRA brain -Chek carotid duplex and echocardiogram -Check lipid  panel and hemoglobin A1c -PT/OT/SLP evaluation -RN bedside swallow exam advance diet if passes -Increased preadmission  aspirin from 81 mg to 325 mg daily  Active Problems:   History of traumatic brain injury -Reported right-sided deficits which appear to be worse today -Daughter surmises current symptoms may be more of a progression i.e. worsening of underlying weaknesses and the patient's wife only just now noticed today -Therapy evaluations as above    CAD, NATIVE VESSEL -No apparent ischemic symptoms -Initial troponin and EKG normal sinus medication as cycle enzymes at this juncture    Chronic combined systolic (EF 62%) and diastolic CHF, NYHA class 2 -Compensated -Holding Lasix until patient passes swallow exam    COPD (chronic obstructive pulmonary disease) with emphysema -Compensated without wheezing -Continue Spiriva    Thrombocytopenia -Chronic and at baseline of around 90,000    Dyslipidemia -All medications prior to admission    CKD (chronic kidney disease), stage III -Anal function stable and at baseline    DVT Prophylaxis: SCDs since platelets less than 100,000  Family Communication:   Daughter at bedside  Code Status:  DO NOT RESUSCITATE  Condition:  Stable  Discharge disposition: Anticipate return to home with wife wants TIA/possible stroke evaluation completed; follow-up on PT/OT recommendations since patient did fall  Time spent in minutes : 60      ELLIS,ALLISON L. ANP on 12/02/2014 at 1:05 PM  Between 7am to 7pm - Pager - [phone]  After 7pm go to [email or web address] - password [email or web address]  And look for the night coverage person covering me after hours  [facility]

## 2014-12-02 NOTE — ED Notes (Signed)
Patient returned from MRI.

## 2014-12-02 NOTE — Progress Notes (Signed)
Patient arrived from the ED accompanied by family. Safety precautions and orders reviewed with patient/family. Stroke booklet given. TELE applied and confirmed. Family will be at bedside with patient. Will continue to monitor.   Ave Filter, RN

## 2014-12-03 ENCOUNTER — Inpatient Hospital Stay (HOSPITAL_COMMUNITY): Payer: Medicare Other

## 2014-12-03 ENCOUNTER — Other Ambulatory Visit (HOSPITAL_COMMUNITY): Payer: Medicare Other

## 2014-12-03 DIAGNOSIS — J432 Centrilobular emphysema: Secondary | ICD-10-CM

## 2014-12-03 LAB — BASIC METABOLIC PANEL
ANION GAP: 6 (ref 5–15)
BUN: 28 mg/dL — ABNORMAL HIGH (ref 6–20)
CO2: 24 mmol/L (ref 22–32)
CREATININE: 1.59 mg/dL — AB (ref 0.61–1.24)
Calcium: 8.7 mg/dL — ABNORMAL LOW (ref 8.9–10.3)
Chloride: 106 mmol/L (ref 101–111)
GFR calc non Af Amer: 37 mL/min — ABNORMAL LOW (ref >60)
GFR, EST AFRICAN AMERICAN: 43 mL/min — AB (ref >60)
Glucose, Bld: 186 mg/dL — ABNORMAL HIGH (ref 65–99)
Potassium: 4.3 mmol/L (ref 3.5–5.1)
Sodium: 136 mmol/L (ref 135–145)

## 2014-12-03 LAB — LIPID PANEL
CHOLESTEROL: 169 mg/dL (ref 0–200)
HDL: 41 mg/dL (ref >40)
LDL Cholesterol: 116 mg/dL — ABNORMAL HIGH (ref 0–99)
TRIGLYCERIDES: 59 mg/dL (ref <150)
Total CHOL/HDL Ratio: 4.1 RATIO
VLDL: 12 mg/dL (ref 0–40)

## 2014-12-03 MED ORDER — ZOLPIDEM TARTRATE 5 MG PO TABS
5.0000 mg | ORAL_TABLET | Freq: Once | ORAL | Status: AC
  Administered 2014-12-03: 5 mg via ORAL
  Filled 2014-12-03: qty 1

## 2014-12-03 MED ORDER — GUAIFENESIN-DM 100-10 MG/5ML PO SYRP: 5.0000 mL | ORAL_SOLUTION | ORAL | Status: DC | PRN

## 2014-12-03 MED ORDER — HALOPERIDOL LACTATE 5 MG/ML IJ SOLN
2.5000 mg | Freq: Once | INTRAMUSCULAR | Status: AC
  Administered 2014-12-03: 2.5 mg via INTRAVENOUS
  Filled 2014-12-03: qty 1

## 2014-12-03 MED ORDER — BENZONATATE 100 MG PO CAPS
100.0000 mg | ORAL_CAPSULE | Freq: Three times a day (TID) | ORAL | Status: DC
  Administered 2014-12-03 – 2014-12-05 (×7): 100 mg via ORAL
  Filled 2014-12-03 (×7): qty 1

## 2014-12-03 NOTE — Evaluation (Signed)
Speech Language Pathology Evaluation Patient Details Name: BOSTON COOKSON MRN: [id number] DOB: May 25, 1925 Today's Date: 12/03/2014 Time: 0900- 0927     Problem List:  Patient Active Problem List   Diagnosis Date Noted  . History of traumatic brain injury 12/02/2014  . CVA (cerebral infarction) 12/02/2014  . CKD (chronic kidney disease), stage III 12/02/2014  . Thrombocytopenia 12/02/2014  . COLD (chronic obstructive lung disease)   . Edema   . Colon cancer   . ECZEMA 05/20/2010  . TOBACCO USE, QUIT 07/03/2009  . [place], PERSISTENT 06/14/2009  . CAD, NATIVE VESSEL 04/29/2009  . Dyslipidemia 04/23/2009  . MI 04/23/2009  . Chronic combined systolic (EF 65%) and diastolic CHF, NYHA class 2 04/23/2009  . COPD (chronic obstructive pulmonary disease) with emphysema 04/23/2009  . BENIGN PROSTATIC HYPERTROPHY, HX OF 04/23/2009   Past Medical History:  Past Medical History  Diagnosis Date  . CAD (coronary artery disease) 04/08/09    Native vessel s/p overlapoing Xience DES stent mid Circumflex  . Other specified forms of chronic ischemic heart disease   . Acute on chronic systolic heart failure   . COPD (chronic obstructive pulmonary disease) 05/2009  . Hyperlipidemia   . Colon cancer 2008    Colon  . Acute myocardial infarction, unspecified site, episode of care unspecified 10/2008   Past Surgical History:  Past Surgical History  Procedure Laterality Date  . Sigmoid colectomy      Done by Dr. Armandina Gemma  . Tonsillectomy      childhood  . Appendectomy     HPI:  Aaron [name] is an 81 y.o. male hx of CAD, MI presenting to the ED with concerns of right sided weakness.Family noted h/o chronic right sided weakness due to TBI 40 years ago but reports new onset right sided weakness much worse. MRI shows acute left MCA territory infarct.    Assessment / Plan / Recommendation Clinical Impression  Cognitive-linguistic evaluation complete. Difficult to differentiate baseline  (due to TBI, heart attack, dementia (undiagnosed by MD) per daughter) vs acute cognitive-linguistic deficits. Current impairements are mostly cognitive in nature characterized by disorientation, decreased short term memory, decreased safety awareness, and an increase in nightime confusion rather than related to language normally seen in left sided CVA. Suspect exacerbated baseline cognitive deficits related to possible dementia. SLP will continue to f/u however to monitor for improvements and need for services after d/c.     SLP Assessment  Patient needs continued Speech Lanaguage Pathology Services    Follow Up Recommendations   (TBD)    Frequency and Duration min 1 x/week  1 week   Pertinent Vitals/Pain Pain Assessment: No/denies pain   SLP Goals  Potential to Achieve Goals (ACUTE ONLY): Fair Potential Considerations (ACUTE ONLY):  (baseline impairements)  SLP Evaluation Prior Functioning  Cognitive/Linguistic Baseline: Baseline deficits Baseline deficit details: dementia per daughter Type of Home: House  Lives With: Spouse Available Help at Discharge: Family   Cognition  Overall Cognitive Status: Impaired/Different from baseline Arousal/Alertness: Lethargic Orientation Level: Oriented to person;Disoriented to place;Disoriented to time;Disoriented to situation Attention: Sustained Sustained Attention: Impaired Sustained Attention Impairment: Verbal basic Memory: Impaired Memory Impairment: Decreased short term memory Decreased Short Term Memory: Verbal basic Awareness: Impaired Awareness Impairment: Intellectual impairment Problem Solving: Impaired Problem Solving Impairment: Verbal basic;Verbal complex Safety/Judgment: Impaired    Comprehension  Auditory Comprehension Overall Auditory Comprehension: Impaired Yes/No Questions: Impaired Basic Biographical Questions: 76-100% accurate Commands: Impaired One Step Basic Commands: 75-100% accurate (with visual cues due to  hearing impairement) Interfering Components: Hearing EffectiveTechniques: Visual/Gestural cues;Increased volume Reading Comprehension Reading Status: Not tested    Expression Expression Primary Mode of Expression: Verbal Verbal Expression Overall Verbal Expression: Appears within functional limits for tasks assessed   Oral / Motor Oral Motor/Sensory Function Overall Oral Motor/Sensory Function: Appears within functional limits for tasks assessed (although unable to formally assess) Motor Speech Overall Motor Speech: Appears within functional limits for tasks assessed   GO    Gabriel Rainwater MA, CCC-SLP [phone]  Lynk Marti Meryl 12/03/2014, 9:34 AM

## 2014-12-03 NOTE — Progress Notes (Addendum)
STROKE TEAM PROGRESS NOTE   HISTORY Aaron [name] is an 81 y.o. male hx of CAD, MI presenting to the ED with concerns of right sided weakness. LSW 2300 on 5/28. Woke up this morning 12/02/2014 and noted right sided weakness. 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 Symptoms have been slowly improving but he is not back to his baseline at this time. MRI brain imaging reviewed and shows acute infarct in the left MCA territory.   Patient was not administered TPA secondary to delay in arrival. He was admitted for further evaluation and treatment.   SUBJECTIVE (INTERVAL HISTORY) His wife and daughter are at the bedside.  Overall they feel his condition is stable, but worse than he was at home. She reports he was independent with ADLs prior to admission. He did receive Haldol and is still a little lethargic from it.   OBJECTIVE Temp:  [97.5 F (36.4 C)-99 F (37.2 C)] 97.5 F (36.4 C) (05/30 1108) Pulse Rate:  [60-81] 76 (05/30 1108) Cardiac Rhythm:  [-]  Resp:  [14-23] 14 (05/30 1108) BP: (97-135)/(46-70) 106/60 mmHg (05/30 1108) SpO2:  [93 %-98 %] 97 % (05/30 1108) Weight:  [67.4 kg (148 lb 9.4 oz)] 67.4 kg (148 lb 9.4 oz) (05/29 2000)  No results for input(s): GLUCAP in the last 168 hours.  Recent Labs Lab 12/02/14 1114 12/02/14 1132  NA 137 140  K 4.3 4.2  CL 104 104  CO2 24  --   GLUCOSE 98 99  BUN 27* 28*  CREATININE 1.68* 1.60*  CALCIUM 8.3*  --     Recent Labs Lab 12/02/14 1114  AST 22  ALT 15*  ALKPHOS 97  BILITOT 0.5  PROT 6.1*  ALBUMIN 2.9*    Recent Labs Lab 12/02/14 1114 12/02/14 1132  WBC 7.0  --   NEUTROABS 4.2  --   HGB 14.1 15.3  HCT 41.6 45.0  MCV 92.7  --   PLT 91*  --    No results for input(s): CKTOTAL, CKMB, CKMBINDEX, TROPONINI in the last 168 hours.  Recent Labs  12/02/14 1114  LABPROT 14.0  INR 1.06    Recent Labs  12/02/14 1114  COLORURINE YELLOW  LABSPEC 1.006   PHURINE 5.0  GLUCOSEU NEGATIVE  HGBUR NEGATIVE  BILIRUBINUR NEGATIVE  KETONESUR NEGATIVE  PROTEINUR NEGATIVE  UROBILINOGEN 0.2  NITRITE NEGATIVE  LEUKOCYTESUR NEGATIVE       Component Value Date/Time   CHOL 169 12/03/2014 0817   TRIG 59 12/03/2014 0817   TRIG 43 04/06/2009   HDL 41 12/03/2014 0817   CHOLHDL 4.1 12/03/2014 0817   VLDL 12 12/03/2014 0817   LDLCALC 116* 12/03/2014 0817   Lab Results  Component Value Date   HGBA1C  05/03/2007    5.8 (NOTE)   The ADA recommends the following therapeutic goals for glycemic   control related to Hgb A1C measurement:   Goal of Therapy:   < 7.0% Hgb A1C   Action Suggested:  > 8.0% Hgb A1C   Ref:  Diabetes Care, 22, Suppl. 1, 1999   No results found for: LABOPIA, COCAINSCRNUR, [place], [place], [facility], [place]   Recent Labs Lab 12/02/14 [place] <5    Dg Chest 2 View  12/02/2014   CLINICAL DATA:  Woke up this morning with right arm and hand weakness, felt like it was numb. EMS called to house for fall, which was a buckling of  right leg; went down to right knee, but did not fall to floor. Last seen normal 11pm last night. Able to walk, but right leg weaker. Alert and oriented on arrival; very hard of hearing. Denies pain.  EXAM: CHEST - 2 VIEW  COMPARISON:  08/13/2009  FINDINGS: Lungs are clear. Mild cardiomegaly. Atheromatous aorta. Partially calcified pleural plaques. No pneumothorax. No effusion. Spurring in the lower thoracic spine.  IMPRESSION: Mild cardiomegaly.   Electronically Signed   By: Lucrezia Europe M.D.   On: 12/02/2014 12:03   Ct Head Wo Contrast  12/02/2014   CLINICAL DATA:  Right arm weakness starting this morning  EXAM: CT HEAD WITHOUT CONTRAST  TECHNIQUE: Contiguous axial images were obtained from the base of the skull through the vertex without intravenous contrast.  COMPARISON:  None.  FINDINGS: No skull fracture is noted. Paranasal sinuses and mastoid air cells are unremarkable. Mild atherosclerotic calcifications of  carotid siphon.  No intracranial hemorrhage, mass effect or midline shift.  Moderate cerebral atrophy. Mild periventricular white matter decreased attenuation probable due to chronic small vessel ischemic changes. Moderate cerebral atrophy. No acute cortical infarction. No mass lesion is noted on this unenhanced scan.  IMPRESSION: No acute intracranial abnormality. Moderate cerebral atrophy. Mild periventricular white matter decreased attenuation probable due to chronic small vessel ischemic changes. No definite acute cortical infarction.   Electronically Signed   By: Lahoma Crocker M.D.   On: 12/02/2014 10:59   Aaron Jodene Nam Head Wo Contrast  12/02/2014   CLINICAL DATA:  Patient with history of RIGHT-sided deficits after falling off a roof several years ago, baseline mild RIGHT-sided weakness, developed worsening RIGHT-sided weakness earlier today, with facial droop and difficulty walking. Partial resolution of symptoms.  EXAM: MRI HEAD WITHOUT CONTRAST  MRA HEAD WITHOUT CONTRAST  TECHNIQUE: Multiplanar, multiecho pulse sequences of the brain and surrounding structures were obtained without intravenous contrast. Angiographic images of the head were obtained using MRA technique without contrast.  COMPARISON:  CT head 04/04/2015.  FINDINGS: The patient could not cooperate to remain motionless for the entire exam. Several images/series are either motion degraded or could not be acquired.  MRI HEAD FINDINGS  Two separate foci of LEFT posterior frontal cortical and subcortical restricted diffusion consistent with acute infarction. These lie within the LEFT MCA territory.  No mass lesion, hydrocephalus, or extra-axial fluid. Advanced atrophy is present.  No acute or chronic hemorrhage is evident based on the limited pulse sequences.  MRA HEAD FINDINGS  Dolichoectatic but widely patent internal carotid arteries and basilar artery. LEFT vertebral dominant. RIGHT vertebral diminutive, appears to primarily contribute to the RIGHT  inferior cerebellar circulation.  No intracranial large vessel occlusion, flow-limiting stenosis, or visible aneurysm.  IMPRESSION: Prematurely truncated and motion degraded exam demonstrating two separate foci of acute infarction in the LEFT posterior frontal cortex and subcortical white matter, LEFT MCA territory. These are nonhemorrhagic.  No large vessel occlusion is evident.  No acute or chronic hemorrhage is observed.   Electronically Signed   By: Rolla Flatten M.D.   On: 12/02/2014 15:10   Aaron Brain Wo Contrast  12/02/2014   CLINICAL DATA:  Patient with history of RIGHT-sided deficits after falling off a roof several years ago, baseline mild RIGHT-sided weakness, developed worsening RIGHT-sided weakness earlier today, with facial droop and difficulty walking. Partial resolution of symptoms.  EXAM: MRI HEAD WITHOUT CONTRAST  MRA HEAD WITHOUT CONTRAST  TECHNIQUE: Multiplanar, multiecho pulse sequences of the brain and surrounding structures were obtained without intravenous contrast.  Angiographic images of the head were obtained using MRA technique without contrast.  COMPARISON:  CT head 04/04/2015.  FINDINGS: The patient could not cooperate to remain motionless for the entire exam. Several images/series are either motion degraded or could not be acquired.  MRI HEAD FINDINGS  Two separate foci of LEFT posterior frontal cortical and subcortical restricted diffusion consistent with acute infarction. These lie within the LEFT MCA territory.  No mass lesion, hydrocephalus, or extra-axial fluid. Advanced atrophy is present.  No acute or chronic hemorrhage is evident based on the limited pulse sequences.  MRA HEAD FINDINGS  Dolichoectatic but widely patent internal carotid arteries and basilar artery. LEFT vertebral dominant. RIGHT vertebral diminutive, appears to primarily contribute to the RIGHT inferior cerebellar circulation.  No intracranial large vessel occlusion, flow-limiting stenosis, or visible aneurysm.   IMPRESSION: Prematurely truncated and motion degraded exam demonstrating two separate foci of acute infarction in the LEFT posterior frontal cortex and subcortical white matter, LEFT MCA territory. These are nonhemorrhagic.  No large vessel occlusion is evident.  No acute or chronic hemorrhage is observed.   Electronically Signed   By: Rolla Flatten M.D.   On: 12/02/2014 15:10   Carotid Doppler  There is 1-39% bilateral ICA stenosis. Vertebral artery flow is antegrade.    PHYSICAL EXAM Frail elderly Caucasian male not in distress. . Afebrile. Head is nontraumatic. Neck is supple without bruit.    Cardiac exam no murmur or gallop. Lungs are clear to auscultation. Distal pulses are well felt. Neurological Exam : Awake and alert oriented 3. Diminished attention, registration and recall. Follows two-step commands. No aphasia or apraxia dysarthria. Extraocular movements are full range without nystagmus. Blinks to threat bilaterally. Mild right lower facial asymmetry. Tongue midline. Motor system exam mild right lower and upper extremity drift. Weakness of right grip and intrinsic hand muscles as well as right hip flexors/5. Normal strength on the left. Coordination is slow but accurate. Sensation is intact. Gait was not tested. ASSESSMENT/PLAN Aaron. TIMMOTHY [name] is a 81 y.o. male with history of CAD, MI presenting with right sided weakness. He did not receive IV t-PA due to delay in arrival.   Stroke:  Dominant 2 left MCA territory infarcts,  embolic secondary to unknown source  Resultant  Right hemiparesis  MRI  2 L MCA territory infarcts  MRA  No large vessel stenosis  Carotid Doppler  No significant stenosis   2D Echo  pending   LDL 116  HgbA1c pending  SCDs for VTE prophylaxis  Diet Heart Room service appropriate?: Yes; Fluid consistency:: Thin  aspirin 81 mg orally every day prior to admission, now on aspirin 325 mg orally every day  Ongoing aggressive stroke risk factor  management  Dr. Leonie Man discussed diagnosis, prognosis,  treatment options and plan of care with daughter. Based on condition prior to admission (memory issues), age and new stroke diagnosis, daughter and MD agree pt is not a good anticoagulation candidate. Therefore, will not pursue further cardioembolic source workup.    Therapy recommendations:  SNF vs CIR  Disposition:  pending   Hyperlipidemia  Home meds:  No statin  LDL 116, goal < 70  Intolerant to statins (severe myalgias)  Other Stroke Risk Factors  Advanced age  Former Cigarette smoker, quit smoking years ago   Coronary artery disease - non-STEMI w/ DES placement 2010 followed by Angel[facility] Form  Other Active Problems  Baseline dementia, very functional prior to admission  Acute on chronic systolic heart failure  COPD  Other Pertinent History  Hx colon cancer 2008  Hospital day # [place] for Pager information 12/03/2014 1:13 PM  I have personally examined this patient, reviewed notes, independently viewed imaging studies, participated in medical decision making and plan of care. I have made any additions or clarifications directly to the above note. Agree with note above. He presented with right hemiparesis secondary to left frontal MCA branch infarct etiology likely embolic with sutures to be determined. He remains at risk for recurrent stroke, TIA, neurological worsening and needs ongoing stroke evaluation and aggressive risk factor modification. Given his baseline dementia and now new weakness is likely a fall risk and hence not an anti-Coagulation long term candidate and hence we'll not pursue aggressive evaluation with TEE or loop recorder. Discussed with daughter and wife and answered questions for agree with the above plan.  Antony Contras, MD Medical Director [place] Pager: [phone] 12/03/2014 3:36 PM    To contact Stroke Continuity provider, please  refer to [email or web address] After hours, contact General Neurology

## 2014-12-03 NOTE — Progress Notes (Signed)
Thank you for consult on Mr. Culbreth. Chart reviewed and therapy notes from this am noted. Patient did well on evaluation and may progress to supervision level in next 1-2 days. Will monitor over next 1-2 days to help determine appropriate rehab venue.

## 2014-12-03 NOTE — Progress Notes (Signed)
Patient resting quietly with eyes closed

## 2014-12-03 NOTE — Progress Notes (Signed)
*  PRELIMINARY RESULTS* Vascular Ultrasound Carotid Duplex (Doppler) has been completed.  Preliminary findings: Bilateral:  1-39% ICA stenosis.  Vertebral artery flow is antegrade.      Landry Mellow, RDMS, RVT  12/03/2014, 3:15 PM

## 2014-12-03 NOTE — Evaluation (Addendum)
Physical Therapy Evaluation Patient Details Name: Aaron [name] MRN: [id number] DOB: 1924-12-03 Today's Date: 12/03/2014   History of Present Illness  81 yo male admitted with R side weakness, facial droop with symptoms resolving with EMS arrival. CT (-) MRI (+) L posterior Frontal cortex and L MCA PMH: TBI, dementia, COPD, CAD, CKD, MI   Clinical Impression  Patient demonstrates deficits in functional mobility as indicated below. Will need continued skilled PT to address deficits and maximize function. Will see as indicated and progress as tolerated.  At this time, patient remains very impulsive and difficult to further evaluate. Patient with significant deficits in balance and awareness, daughter present during evaluation, states that patient with history of multiple falls at home.  Daughter questions if deficits are related to medication, advised we will continue to follow and monitor progression, at this time, daughter in agreement that patient is not safe for home with spouse care under this condition.  Will see as indicated and progress as tolerated. OF NOTE: Daughter reports patient with baseline dementia. May need to consider impact of continued changes in venue and environment related to patient agitation and impulsivity.    Follow Up Recommendations SNF;Supervision/Assistance - 24 hour     Equipment Recommendations  None recommended by PT    Recommendations for Other Services       Precautions / Restrictions Precautions Precautions: Fall      Mobility  Bed Mobility Overal bed mobility: Needs Assistance Bed Mobility: Rolling;Sit to Supine Rolling: Supervision     Sit to supine: Supervision   General bed mobility comments: Supervision for safety, very impulsive poor awareness of surroundings  Transfers Overall transfer level: Needs assistance Equipment used: 1 person hand held assist Transfers: Sit to/from Stand Sit to Stand: Min assist         General transfer  comment: VCs for safety, assist for stability, patient extremely impulsive  Ambulation/Gait Ambulation/Gait assistance: Min assist;Mod assist Ambulation Distance (Feet): 60 Feet Assistive device: 1 person hand held assist (furniture surfing despite assist) Gait Pattern/deviations: Step-through pattern;Decreased stride length;Shuffle;Decreased dorsiflexion - right;Staggering left;Staggering right;Drifts right/left;Trunk flexed;Narrow base of support Gait velocity: decreased Gait velocity interpretation: Below normal speed for age/gender General Gait Details: Patient very unsteady with gait, furniture surfing despite assist provided. Multiple LOB noted requiring increased physical assist to prevent fall. Daughter present states that this is not his baseline but also states that he has has several falls in past 6 months.  Stairs            Wheelchair Mobility    Modified Rankin (Stroke Patients Only) Modified Rankin (Stroke Patients Only) Pre-Morbid Rankin Score: Moderate disability Modified Rankin: Moderately severe disability     Balance Overall balance assessment: History of Falls                                           Pertinent Vitals/Pain Pain Assessment: No/denies pain    Home Living Family/patient expects to be discharged to:: Private residence Living Arrangements: Spouse/significant other Available Help at Discharge: Family Type of Home: House         Home Equipment:  (pt non receptive to use of equipment) Additional Comments: spoke with daughter who states that in current condition, wife would not be able to care for patient safely    Prior Function  Hand Dominance   Dominant Hand: Right    Extremity/Trunk Assessment               Lower Extremity Assessment: Generalized weakness;RLE deficits/detail;Difficult to assess due to impaired cognition RLE Deficits / Details: functional weakness noted with all  aspects of mobility, unable to perform isolated testing secondary to patient impulsivity and unwillingness to perform some activities       Communication   Communication: Advanced Pain Management  Cognition Arousal/Alertness: Awake/alert Behavior During Therapy: Restless;Impulsive Overall Cognitive Status: Impaired/Different from baseline Area of Impairment: Attention;Following commands;Safety/judgement;Awareness;Problem solving   Current Attention Level: Focused Memory: Decreased recall of precautions;Decreased short-term memory Following Commands: Follows one step commands consistently Safety/Judgement: Decreased awareness of safety;Decreased awareness of deficits Awareness:  (pre-intellectual) Problem Solving: Slow processing;Decreased initiation;Difficulty sequencing;Requires verbal cues;Requires tactile cues General Comments: Patient extremely impulsive throughout session, unacceptable of cues and assist at times.     General Comments      Exercises        Assessment/Plan    PT Assessment Patient needs continued PT services  PT Diagnosis Difficulty walking;Abnormality of gait;Generalized weakness;Altered mental status   PT Problem List Decreased strength;Decreased range of motion;Decreased activity tolerance;Decreased balance;Decreased mobility;Decreased coordination;Decreased cognition;Decreased safety awareness  PT Treatment Interventions DME instruction;Gait training;Stair training;Therapeutic activities;Functional mobility training;Therapeutic exercise;Balance training;Cognitive remediation;Patient/family education   PT Goals (Current goals can be found in the Care Plan section) Acute Rehab PT Goals Patient Stated Goal: none stated (per daughter to get well enough to return home) PT Goal Formulation: With family Time For Goal Achievement: 12/17/14 Potential to Achieve Goals: Fair    Frequency Min 3X/week   Barriers to discharge Decreased caregiver support;Other (comment) per daughter,  patient too 'proud' to use assist or assistive devices that would ensure his safety with mobility.    Co-evaluation               End of Session Equipment Utilized During Treatment: Gait belt Activity Tolerance: Other (comment);Patient limited by fatigue (limited by patient impulsivity) Patient left: in bed;with call bell/phone within reach;with bed alarm set;with family/visitor present Nurse Communication: Mobility status         Time: 0750-0809 PT Time Calculation (min) (ACUTE ONLY): 19 min   Charges:   PT Evaluation $Initial PT Evaluation Tier I: 1 Procedure     PT G CodesDuncan Dull 12-14-2014, 10:36 AM Alben Deeds, PT DPT  [phone]

## 2014-12-03 NOTE — Progress Notes (Signed)
Patient has increased agitation getting out of bed unassisted insisting on going home daughter at bedside states he has dementia but change of surrounding making it worse unable to console patient, Provider notified new order  Given. Iv wrapped with kling as patient was pulling on it and wanting it to be removed. Will continue to monitor.

## 2014-12-03 NOTE — Evaluation (Signed)
Occupational Therapy Evaluation Patient Details Name: Aaron [name] MRN: [id number] DOB: Nov 07, 1924 Today's Date: 12/03/2014    History of Present Illness 81 yo male admitted with R side weakness, facial droop with symptoms resolving with EMS arrival. CT (-) MRI (+) L posterior Frontal cortex and L MCA PMH: TBI, dementia, COPD, CAD, CKD, MI    Clinical Impression   PT admitted with L MCA ( frontal infarct). Pt currently with functional limitiations due to the deficits listed below (see OT problem list). PTA living at home with wife and performing adls MOD I. Pt will benefit from skilled OT to increase their independence and safety with adls and balance to allow discharge SNF. OT discussed in length with daughter d/c recommendations and Daughter requesting SNF for placement. Daughter educated on options and after much discussion again requested SNF. Wife requesting home. SW notified of [place] request. RN notified on continuing to mobilize , use calming techniques and limit sedating medications to maximize potential with therapy during acute stay.      Follow Up Recommendations  SNF;Supervision/Assistance - 24 hour    Equipment Recommendations  Other (comment) (defer)    Recommendations for Other Services       Precautions / Restrictions Precautions Precautions: Fall      Mobility Bed Mobility               General bed mobility comments: in chair on arrival  Transfers Overall transfer level: Needs assistance Equipment used: 1 person hand held assist Transfers: Sit to/from Stand Sit to Stand: Min assist         General transfer comment: VCs for safety, assist for stability, posterior LOB with static stand. strong ankle strategy required    Balance Overall balance assessment: History of Falls                                          ADL Overall ADL's : Needs assistance/impaired     Grooming: Oral care;Minimal  assistance;Standing Grooming Details (indicate cue type and reason): cues to perform task but able to complete rinsing off dentures. pt with min (A) for balance. Pt required 1 UE (A) for balance                 Toilet Transfer: Minimal assistance;Ambulation;BSC Toilet Transfer Details (indicate cue type and reason): pt with posterior lean with initial sit<>Stand         Functional mobility during ADLs: Minimal assistance;Cueing for safety General ADL Comments: Pt verbalized "lets turn around" when attempting to further assess balance. pt with R LE fatigue. pt noted to have scissored gait and R LE drag. Daughter reports R LE dragging is much more pronounced then baseline. L LE crossing R LE is new. Pt and daughter educated on fall risk with this gait pattern.      Vision     Perception     Praxis      Pertinent Vitals/Pain Pain Assessment: No/denies pain     Hand Dominance Right   Extremity/Trunk Assessment Upper Extremity Assessment Upper Extremity Assessment: RUE deficits/detail RUE Deficits / Details: 3 out 5 MMT , drift, unable to sustain shoulder elevation. ( daughter states thats the best he has done yet) RUE Coordination: decreased fine motor;decreased gross motor   Lower Extremity Assessment Lower Extremity Assessment: Defer to PT evaluation   Cervical / Trunk Assessment Cervical /  Trunk Assessment: Kyphotic   Communication Communication Communication: HOH   Cognition Arousal/Alertness: Awake/alert Behavior During Therapy: Flat affect Overall Cognitive Status: Impaired/Different from baseline Area of Impairment: Attention;Following commands;Safety/judgement;Awareness;Problem solving;Memory   Current Attention Level: Focused Memory: Decreased recall of precautions;Decreased short-term memory Following Commands: Follows one step commands with increased time Safety/Judgement: Decreased awareness of safety;Decreased awareness of deficits Awareness:  Intellectual (pre-intellectual) Problem Solving: Slow processing;Requires verbal cues General Comments: Pt required repetition of cues due to [facility] but if pt verbalized instructions back first with much better return demo. OT had patient repeat statement then perform task.    General Comments       Exercises       Shoulder Instructions      Home Living Family/patient expects to be discharged to:: Private residence Living Arrangements: Spouse/significant other Available Help at Discharge: Family Type of Home: House             Bathroom Shower/Tub: Teaching laboratory technician Toilet: Standard     Home Equipment:  (pt non receptive to use of equipment)   Additional Comments: Spoke with daughter and wife this session who report that PTA- pt was doing all adls but needed meals prepared and cues for changing clothes at times. He could perform but wouldnt remember to do the task  Lives With: Spouse    Prior Functioning/Environment Level of Independence: Needs assistance    ADL's / Homemaking Assistance Needed: (A) with IADLs and verbal cues as reminders for task throughout the day   Comments: daughter reports pt does wonder some at night. Wife reports he goes to bed and only gets up to go to the bathroom. Daughter reports this is not true    OT Diagnosis: Generalized weakness;Cognitive deficits   OT Problem List: Decreased strength;Decreased range of motion;Decreased activity tolerance;Impaired balance (sitting and/or standing);Decreased cognition;Decreased safety awareness;Decreased knowledge of use of DME or AE;Decreased knowledge of precautions;Impaired UE functional use   OT Treatment/Interventions: Self-care/ADL training;Therapeutic exercise;DME and/or AE instruction;Therapeutic activities;Cognitive remediation/compensation;Patient/family education;Balance training    OT Goals(Current goals can be found in the care plan section) Acute Rehab OT Goals Patient Stated Goal:  none stated (per daughter to get well enough to return home) OT Goal Formulation: With patient/family Time For Goal Achievement: 12/17/14 Potential to Achieve Goals: Good  OT Frequency: Min 2X/week   Barriers to D/C: Decreased caregiver support  elderly wife ( marriede 3 yrs) daughter reports "she can not handle him at home" wife reports "he wants to be home with me" daughter reports "he is fine as long as he can see her"       Co-evaluation              End of Session Nurse Communication: Mobility status;Precautions  Activity Tolerance: Patient tolerated treatment well Patient left: in chair;with call bell/phone within reach;with chair alarm set;with nursing/sitter in room   Time: 1332-1402 OT Time Calculation (min): 30 min Charges:  OT General Charges $OT Visit: 1 Procedure OT Evaluation $Initial OT Evaluation Tier I: 1 Procedure OT Treatments $Self Care/Home Management : 8-22 mins G-Codes:    Peri Maris 12/16/14, 2:21 PM Pager: [phone] r

## 2014-12-03 NOTE — Progress Notes (Signed)
Rehab Admissions Coordinator Note:  Patient was screened by Retta Diones for appropriateness for an Inpatient Acute Rehab Consult.  At this time, we are recommending to wait until OT consult is completed.  I can then make a better determination of rehab potential and needs for inpatient rehab versus [place] therapies.    Jodell Cipro M 12/03/2014, 10:49 AM  I can be reached at [phone].

## 2014-12-03 NOTE — Progress Notes (Signed)
[facility]                                                                              Patient Demographics  Aaron [name], is a 81 y.o. male, DOB - [birth year], YKZ:[id number]  Admit date - 12/02/2014   Admitting Physician Aaron Patricia, MD  Outpatient Primary MD for the patient is Aaron Grant, MD  LOS - 1   Chief Complaint  Patient presents with  . Extremity Weakness       Brief HPI   Patient is a 81 year old male with remote history of TBI after falling off a roof for 41 years ago with subsequent right-sided deficit, also with ischemic cardiomyopathy with combined systolic and diastolic CHF EF of 93%, COPD and dyslipidemia presented to ED as his wife noticed worsening of right-sided deficits from baseline primarily the right hand from being very floppy on the morning of admission. She noticed symptoms around 7:30 AM but did not tell her daughter until 1 AM. In addition the patient fell when attempting to ambulate on the morning of admission.  No recent acute illnesses such as nausea vomiting diarrhea although patient has developed what is described as a "bad cough" recently but daughter unsure exactly when began. In ER patient was mildly febrile with a temperature of 99.3 but otherwise was hemodynamically stable, was on oxygen at 2 L/m saturating 96%. CT of the head unremarkable as was chest x-ray. Renal function at baseline with BUN 28 and creatinine 1.6. Hemoglobin stable at 15, no leukocytosis, patient did have mild thrombocytopenia 91,000. UA negative for UTI  Assessment & Plan    Principal Problem:  Acute CVA with right-sided weakness: Patient has chronic right-sided weakness, was worse at the time of admission with facial drooping or difficulty walking - MRI  brain showed 2 separate foci of acute infarction in the left posterior frontal cortex and subcortical white matter left MCA territory, nonhemorrhagic - MRA showed no large vessel occlusion -  LDL 116 - Neurology consulted, recommended aspirin 325 mg daily -2-D echo, carotid Dopplers pending - PT evaluation recommending inpatient rehabilitation, consult placed   Active Problems:  History of traumatic brain injury with the chronic right-sided deficits - Continue physical therapy   CAD, NATIVE VESSEL -No apparent ischemic symptoms -Initial troponin and EKG normal    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, NYHA class 2 -Compensated - BP currently soft 106/60, due to acute stroke, will hold Lasix   COPD (chronic obstructive pulmonary disease) with emphysema - Stable, no wheezing, continue Spiriva - Added Tessalon Perles, Robitussin for coughing   Thrombocytopenia -Chronic and at baseline of around 90,000   Dyslipidemia LDL 116, continue statin   CKD (chronic kidney disease), stage III - Unknown baseline, creatinine currently 1.6, follow closely, holding Lasix  Code Status: DO NOT RESUSCITATE   Family Communication: Discussed in detail with the patient, all imaging results, lab results explained to the patient and daughter   Disposition Plan: SNF vs CIR  Time Spent in minutes  25 minutes  Procedures  MRI/MRA  Consults   neuro  DVT Prophylaxis  SCD's  Medications  Scheduled  Meds: . aspirin  300 mg Rectal Daily   Or  . aspirin EC  325 mg Oral Daily  . benzonatate  100 mg Oral TID  . tiotropium  18 mcg Inhalation Daily   Continuous Infusions:  PRN Meds:.guaiFENesin-dextromethorphan, senna-docusate   Antibiotics   Anti-infectives    None        Subjective:   Aaron [name] was seen and examined today.  Patient denies dizziness, chest pain, shortness of breath, abdominal pain, N/V/D/C. per daughter at the bedside, was confused, did not sleep well last night   Objective:   Blood pressure 106/60, pulse 76, temperature 97.5 F (36.4 C), temperature source Oral, resp. rate 14, height 5\' 4"  (1.626 m), weight 67.4 kg (148 lb  9.4 oz), SpO2 97 %.  Wt Readings from Last 3 Encounters:  12/02/14 67.4 kg (148 lb 9.4 oz)  10/03/14 68.947 kg (152 lb)  05/24/14 68.947 kg (152 lb)     Intake/Output Summary (Last 24 hours) at 12/03/14 1132 Last data filed at 12/02/14 1758  Gross per 24 hour  Intake    360 ml  Output      0 ml  Net    360 ml    Exam  General: Alert and oriented to self, NADIntermittently follows commands   HEENT::  PERRLA, EOMI, Anicteic Sclera, mucous membranes moist.   Neck: Supple, no JVD, no masses  CVS: S1 S2 auscultated, no rubs, murmurs or gallops. Regular rate and rhythm.  Respiratory: Clear to auscultation bilaterally, no wheezing, rales or rhonchi  Abdomen: Soft, nontender, nondistended, + bowel sounds  Ext: no cyanosis clubbing or edema  Neuro:difficult to assess, patient intermittently follow commands, states tired, weakness on the right side   Skin: No rashes  Psych: Sleepy, tired, although he worked with physical therapy before my encounter   Data Review   Micro Results No results found for this or any previous visit (from the past 240 hour(s)).  Radiology Reports Dg Chest 2 View  12/02/2014   CLINICAL DATA:  Woke up this morning with right arm and hand weakness, felt like it was numb. EMS called to house for fall, which was a buckling of right leg; went down to right knee, but did not fall to floor. Last seen normal 11pm last night. Able to walk, but right leg weaker. Alert and oriented on arrival; very hard of hearing. Denies pain.  EXAM: CHEST - 2 VIEW  COMPARISON:  08/13/2009  FINDINGS: Lungs are clear. Mild cardiomegaly. Atheromatous aorta. Partially calcified pleural plaques. No pneumothorax. No effusion. Spurring in the lower thoracic spine.  IMPRESSION: Mild cardiomegaly.   Electronically Signed   By: Lucrezia Europe M.D.   On: 12/02/2014 12:03   Ct Head Wo Contrast  12/02/2014   CLINICAL DATA:  Right arm weakness starting this morning  EXAM: CT HEAD WITHOUT  CONTRAST  TECHNIQUE: Contiguous axial images were obtained from the base of the skull through the vertex without intravenous contrast.  COMPARISON:  None.  FINDINGS: No skull fracture is noted. Paranasal sinuses and mastoid air cells are unremarkable. Mild atherosclerotic calcifications of carotid siphon.  No intracranial hemorrhage, mass effect or midline shift.  Moderate cerebral atrophy. Mild periventricular white matter decreased attenuation probable due to chronic small vessel ischemic changes. Moderate cerebral atrophy. No acute cortical infarction. No mass lesion is noted on this unenhanced scan.  IMPRESSION: No acute intracranial abnormality. Moderate cerebral atrophy. Mild periventricular white matter decreased attenuation probable due to chronic small vessel ischemic changes.  No definite acute cortical infarction.   Electronically Signed   By: Lahoma Crocker M.D.   On: 12/02/2014 10:59   Mr Jodene Nam Head Wo Contrast  12/02/2014   CLINICAL DATA:  Patient with history of RIGHT-sided deficits after falling off a roof several years ago, baseline mild RIGHT-sided weakness, developed worsening RIGHT-sided weakness earlier today, with facial droop and difficulty walking. Partial resolution of symptoms.  EXAM: MRI HEAD WITHOUT CONTRAST  MRA HEAD WITHOUT CONTRAST  TECHNIQUE: Multiplanar, multiecho pulse sequences of the brain and surrounding structures were obtained without intravenous contrast. Angiographic images of the head were obtained using MRA technique without contrast.  COMPARISON:  CT head 04/04/2015.  FINDINGS: The patient could not cooperate to remain motionless for the entire exam. Several images/series are either motion degraded or could not be acquired.  MRI HEAD FINDINGS  Two separate foci of LEFT posterior frontal cortical and subcortical restricted diffusion consistent with acute infarction. These lie within the LEFT MCA territory.  No mass lesion, hydrocephalus, or extra-axial fluid. Advanced atrophy  is present.  No acute or chronic hemorrhage is evident based on the limited pulse sequences.  MRA HEAD FINDINGS  Dolichoectatic but widely patent internal carotid arteries and basilar artery. LEFT vertebral dominant. RIGHT vertebral diminutive, appears to primarily contribute to the RIGHT inferior cerebellar circulation.  No intracranial large vessel occlusion, flow-limiting stenosis, or visible aneurysm.  IMPRESSION: Prematurely truncated and motion degraded exam demonstrating two separate foci of acute infarction in the LEFT posterior frontal cortex and subcortical white matter, LEFT MCA territory. These are nonhemorrhagic.  No large vessel occlusion is evident.  No acute or chronic hemorrhage is observed.   Electronically Signed   By: Rolla Flatten M.D.   On: 12/02/2014 15:10   Mr Brain Wo Contrast  12/02/2014   CLINICAL DATA:  Patient with history of RIGHT-sided deficits after falling off a roof several years ago, baseline mild RIGHT-sided weakness, developed worsening RIGHT-sided weakness earlier today, with facial droop and difficulty walking. Partial resolution of symptoms.  EXAM: MRI HEAD WITHOUT CONTRAST  MRA HEAD WITHOUT CONTRAST  TECHNIQUE: Multiplanar, multiecho pulse sequences of the brain and surrounding structures were obtained without intravenous contrast. Angiographic images of the head were obtained using MRA technique without contrast.  COMPARISON:  CT head 04/04/2015.  FINDINGS: The patient could not cooperate to remain motionless for the entire exam. Several images/series are either motion degraded or could not be acquired.  MRI HEAD FINDINGS  Two separate foci of LEFT posterior frontal cortical and subcortical restricted diffusion consistent with acute infarction. These lie within the LEFT MCA territory.  No mass lesion, hydrocephalus, or extra-axial fluid. Advanced atrophy is present.  No acute or chronic hemorrhage is evident based on the limited pulse sequences.  MRA HEAD FINDINGS   Dolichoectatic but widely patent internal carotid arteries and basilar artery. LEFT vertebral dominant. RIGHT vertebral diminutive, appears to primarily contribute to the RIGHT inferior cerebellar circulation.  No intracranial large vessel occlusion, flow-limiting stenosis, or visible aneurysm.  IMPRESSION: Prematurely truncated and motion degraded exam demonstrating two separate foci of acute infarction in the LEFT posterior frontal cortex and subcortical white matter, LEFT MCA territory. These are nonhemorrhagic.  No large vessel occlusion is evident.  No acute or chronic hemorrhage is observed.   Electronically Signed   By: Rolla Flatten M.D.   On: 12/02/2014 15:10    CBC  Recent Labs Lab 12/02/14 1114 12/02/14 1132  WBC 7.0  --   HGB 14.1 15.3  HCT  41.6 45.0  PLT 91*  --   MCV 92.7  --   MCH 31.4  --   MCHC 33.9  --   RDW 14.3  --   LYMPHSABS 1.7  --   MONOABS 1.0  --   EOSABS 0.1  --   BASOSABS 0.0  --     Chemistries   Recent Labs Lab 12/02/14 1114 12/02/14 1132  NA 137 140  K 4.3 4.2  CL 104 104  CO2 24  --   GLUCOSE 98 99  BUN 27* 28*  CREATININE 1.68* 1.60*  CALCIUM 8.3*  --   AST 22  --   ALT 15*  --   ALKPHOS 97  --   BILITOT 0.5  --    ------------------------------------------------------------------------------------------------------------------ estimated creatinine clearance is 27.2 mL/min (by C-G formula based on Cr of 1.6). ------------------------------------------------------------------------------------------------------------------ No results for input(s): HGBA1C in the last 72 hours. ------------------------------------------------------------------------------------------------------------------  Recent Labs  12/03/14 0817  CHOL 169  HDL 41  LDLCALC 116*  TRIG 59  CHOLHDL 4.1   ------------------------------------------------------------------------------------------------------------------ No results for input(s): TSH, T4TOTAL,  T3FREE, THYROIDAB in the last 72 hours.  Invalid input(s): FREET3 ------------------------------------------------------------------------------------------------------------------ No results for input(s): VITAMINB12, FOLATE, FERRITIN, TIBC, IRON, RETICCTPCT in the last 72 hours.  Coagulation profile  Recent Labs Lab 12/02/14 1114  INR 1.06    No results for input(s): DDIMER in the last 72 hours.  Cardiac Enzymes No results for input(s): CKMB, TROPONINI, MYOGLOBIN in the last 168 hours.  Invalid input(s): CK ------------------------------------------------------------------------------------------------------------------ Invalid input(s): POCBNP  No results for input(s): GLUCAP in the last 72 hours.   Daphane Odekirk M.D. [facility] 12/03/2014, 11:32 AM  Pager: [phone]   Between 7am to 7pm - call Pager - [phone]  After 7pm go to [email or web address] - password [email or web address]  Call night coverage person covering after 7pm

## 2014-12-04 ENCOUNTER — Inpatient Hospital Stay (HOSPITAL_COMMUNITY): Payer: Medicare Other

## 2014-12-04 ENCOUNTER — Other Ambulatory Visit (HOSPITAL_COMMUNITY): Payer: Medicare Other

## 2014-12-04 DIAGNOSIS — R531 Weakness: Secondary | ICD-10-CM | POA: Insufficient documentation

## 2014-12-04 DIAGNOSIS — I6789 Other cerebrovascular disease: Secondary | ICD-10-CM

## 2014-12-04 DIAGNOSIS — I2510 Atherosclerotic heart disease of native coronary artery without angina pectoris: Secondary | ICD-10-CM

## 2014-12-04 DIAGNOSIS — M6289 Other specified disorders of muscle: Secondary | ICD-10-CM

## 2014-12-04 LAB — BASIC METABOLIC PANEL
Anion gap: 10 (ref 5–15)
BUN: 23 mg/dL — AB (ref 6–20)
CHLORIDE: 106 mmol/L (ref 101–111)
CO2: 24 mmol/L (ref 22–32)
Calcium: 8.7 mg/dL — ABNORMAL LOW (ref 8.9–10.3)
Creatinine, Ser: 1.42 mg/dL — ABNORMAL HIGH (ref 0.61–1.24)
GFR calc Af Amer: 50 mL/min — ABNORMAL LOW (ref >60)
GFR calc non Af Amer: 43 mL/min — ABNORMAL LOW (ref >60)
Glucose, Bld: 94 mg/dL (ref 65–99)
Potassium: 3.7 mmol/L (ref 3.5–5.1)
SODIUM: 140 mmol/L (ref 135–145)

## 2014-12-04 LAB — HEMOGLOBIN A1C
Hgb A1c MFr Bld: 5.8 % — ABNORMAL HIGH (ref 4.8–5.6)
Mean Plasma Glucose: 120 mg/dL

## 2014-12-04 NOTE — Progress Notes (Signed)
STROKE TEAM PROGRESS NOTE   HISTORY Aaron [name] is an 81 y.o. male hx of CAD, MI presenting to the ED with concerns of right sided weakness. LSW 2300 on 5/28. Woke up this morning 12/02/2014 and noted right sided weakness. 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 Symptoms have been slowly improving but he is not back to his baseline at this time. MRI brain imaging reviewed and shows acute infarct in the left MCA territory.   Patient was not administered TPA secondary to delay in arrival. He was admitted for further evaluation and treatment.   SUBJECTIVE (INTERVAL HISTORY) Patient up walking in hall with therapy. No complaints. Daughter interested in SNF as recommended by OT.   OBJECTIVE Temp:  [97.9 F (36.6 C)-99.6 F (37.6 C)] 99 F (37.2 C) (05/31 0910) Pulse Rate:  [75-85] 85 (05/31 0910) Cardiac Rhythm:  [-]  Resp:  [16-18] 16 (05/31 0910) BP: (102-150)/(56-73) 117/61 mmHg (05/31 0910) SpO2:  [90 %-98 %] 90 % (05/31 0910)  No results for input(s): GLUCAP in the last 168 hours.  Recent Labs Lab 12/02/14 1114 12/02/14 1132 12/03/14 1325 12/04/14 0635  NA 137 140 136 140  K 4.3 4.2 4.3 3.7  CL 104 104 106 106  CO2 24  --  24 24  GLUCOSE 98 99 186* 94  BUN 27* 28* 28* 23*  CREATININE 1.68* 1.60* 1.59* 1.42*  CALCIUM 8.3*  --  8.7* 8.7*    Recent Labs Lab 12/02/14 1114  AST 22  ALT 15*  ALKPHOS 97  BILITOT 0.5  PROT 6.1*  ALBUMIN 2.9*    Recent Labs Lab 12/02/14 1114 12/02/14 1132  WBC 7.0  --   NEUTROABS 4.2  --   HGB 14.1 15.3  HCT 41.6 45.0  MCV 92.7  --   PLT 91*  --    No results for input(s): CKTOTAL, CKMB, CKMBINDEX, TROPONINI in the last 168 hours.  Recent Labs  12/02/14 1114  LABPROT 14.0  INR 1.06    Recent Labs  12/02/14 1114  COLORURINE YELLOW  LABSPEC 1.006  PHURINE 5.0  GLUCOSEU NEGATIVE  HGBUR NEGATIVE  BILIRUBINUR NEGATIVE  KETONESUR NEGATIVE  PROTEINUR  NEGATIVE  UROBILINOGEN 0.2  NITRITE NEGATIVE  LEUKOCYTESUR NEGATIVE       Component Value Date/Time   CHOL 169 12/03/2014 0817   TRIG 59 12/03/2014 0817   TRIG 43 04/06/2009   HDL 41 12/03/2014 0817   CHOLHDL 4.1 12/03/2014 0817   VLDL 12 12/03/2014 0817   LDLCALC 116* 12/03/2014 0817   Lab Results  Component Value Date   HGBA1C 5.8* 12/03/2014   No results found for: LABOPIA, COCAINSCRNUR, LABBENZ, AMPHETMU, THCU, LABBARB   Recent Labs Lab 12/02/14 [place] <5    Dg Chest 2 View  12/02/2014   CLINICAL DATA:  Woke up this morning with right arm and hand weakness, felt like it was numb. EMS called to house for fall, which was a buckling of right leg; went down to right knee, but did not fall to floor. Last seen normal 11pm last night. Able to walk, but right leg weaker. Alert and oriented on arrival; very hard of hearing. Denies pain.  EXAM: CHEST - 2 VIEW  COMPARISON:  08/13/2009  FINDINGS: Lungs are clear. Mild cardiomegaly. Atheromatous aorta. Partially calcified pleural plaques. No pneumothorax. No effusion. Spurring in the lower thoracic spine.  IMPRESSION: Mild cardiomegaly.   Electronically Signed  By: Lucrezia Europe M.D.   On: 12/02/2014 12:03   Mr Jodene Nam Head Wo Contrast  12/02/2014   CLINICAL DATA:  Patient with history of RIGHT-sided deficits after falling off a roof several years ago, baseline mild RIGHT-sided weakness, developed worsening RIGHT-sided weakness earlier today, with facial droop and difficulty walking. Partial resolution of symptoms.  EXAM: MRI HEAD WITHOUT CONTRAST  MRA HEAD WITHOUT CONTRAST  TECHNIQUE: Multiplanar, multiecho pulse sequences of the brain and surrounding structures were obtained without intravenous contrast. Angiographic images of the head were obtained using MRA technique without contrast.  COMPARISON:  CT head 04/04/2015.  FINDINGS: The patient could not cooperate to remain motionless for the entire exam. Several images/series are either motion  degraded or could not be acquired.  MRI HEAD FINDINGS  Two separate foci of LEFT posterior frontal cortical and subcortical restricted diffusion consistent with acute infarction. These lie within the LEFT MCA territory.  No mass lesion, hydrocephalus, or extra-axial fluid. Advanced atrophy is present.  No acute or chronic hemorrhage is evident based on the limited pulse sequences.  MRA HEAD FINDINGS  Dolichoectatic but widely patent internal carotid arteries and basilar artery. LEFT vertebral dominant. RIGHT vertebral diminutive, appears to primarily contribute to the RIGHT inferior cerebellar circulation.  No intracranial large vessel occlusion, flow-limiting stenosis, or visible aneurysm.  IMPRESSION: Prematurely truncated and motion degraded exam demonstrating two separate foci of acute infarction in the LEFT posterior frontal cortex and subcortical white matter, LEFT MCA territory. These are nonhemorrhagic.  No large vessel occlusion is evident.  No acute or chronic hemorrhage is observed.   Electronically Signed   By: Rolla Flatten M.D.   On: 12/02/2014 15:10   Mr Brain Wo Contrast  12/02/2014   CLINICAL DATA:  Patient with history of RIGHT-sided deficits after falling off a roof several years ago, baseline mild RIGHT-sided weakness, developed worsening RIGHT-sided weakness earlier today, with facial droop and difficulty walking. Partial resolution of symptoms.  EXAM: MRI HEAD WITHOUT CONTRAST  MRA HEAD WITHOUT CONTRAST  TECHNIQUE: Multiplanar, multiecho pulse sequences of the brain and surrounding structures were obtained without intravenous contrast. Angiographic images of the head were obtained using MRA technique without contrast.  COMPARISON:  CT head 04/04/2015.  FINDINGS: The patient could not cooperate to remain motionless for the entire exam. Several images/series are either motion degraded or could not be acquired.  MRI HEAD FINDINGS  Two separate foci of LEFT posterior frontal cortical and  subcortical restricted diffusion consistent with acute infarction. These lie within the LEFT MCA territory.  No mass lesion, hydrocephalus, or extra-axial fluid. Advanced atrophy is present.  No acute or chronic hemorrhage is evident based on the limited pulse sequences.  MRA HEAD FINDINGS  Dolichoectatic but widely patent internal carotid arteries and basilar artery. LEFT vertebral dominant. RIGHT vertebral diminutive, appears to primarily contribute to the RIGHT inferior cerebellar circulation.  No intracranial large vessel occlusion, flow-limiting stenosis, or visible aneurysm.  IMPRESSION: Prematurely truncated and motion degraded exam demonstrating two separate foci of acute infarction in the LEFT posterior frontal cortex and subcortical white matter, LEFT MCA territory. These are nonhemorrhagic.  No large vessel occlusion is evident.  No acute or chronic hemorrhage is observed.   Electronically Signed   By: Rolla Flatten M.D.   On: 12/02/2014 15:10   Carotid Doppler  There is 1-39% bilateral ICA stenosis. Vertebral artery flow is antegrade.    PHYSICAL EXAM Frail elderly Caucasian male not in distress. . Afebrile. Head is nontraumatic. Neck is  supple without bruit.    Cardiac exam no murmur or gallop. Lungs are clear to auscultation. Distal pulses are well felt. Neurological Exam : Awake and alert oriented 3. Diminished attention, registration and recall. Follows two-step commands. No aphasia or apraxia dysarthria. Extraocular movements are full range without nystagmus. Blinks to threat bilaterally. Mild right lower facial asymmetry. Tongue midline. Motor system exam mild right lower and upper extremity drift. Weakness of right grip and intrinsic hand muscles as well as right hip flexors/5. Normal strength on the left. Coordination is slow but accurate. Sensation is intact. Gait shows dragging of the right leg with mild apraxia while turning.   ASSESSMENT/PLAN Aaron [name] is a 81 y.o. male  with history of CAD, MI presenting with right sided weakness. He did not receive IV t-PA due to delay in arrival.   Stroke:  Dominant 2 left MCA territory infarcts,  embolic secondary to unknown source  Resultant  Right hemiparesis  MRI  2 L MCA territory infarcts  MRA  No large vessel stenosis  Carotid Doppler  No significant stenosis   2D Echo  pending   LDL 116  HgbA1c 5.8  SCDs for VTE prophylaxis Diet Heart Room service appropriate?: Yes; Fluid consistency:: Thin  aspirin 81 mg orally every day prior to admission, now on aspirin 325 mg orally every day  Ongoing aggressive stroke risk factor management  Dr. Leonie Man discussed diagnosis, prognosis,  treatment options and plan of care with daughter. Based on condition prior to admission (memory issues), age and new stroke diagnosis, daughter and MD agree pt is not a good anticoagulation candidate. Therefore, will not pursue further cardioembolic source workup.    Therapy recommendations:  SNF vs CIR  Disposition:  Family prefer SNF. Daughter feels 21 days is ideal before letting him go back home.   Hyperlipidemia  Home meds:  No statin  LDL 116, goal < 70  Intolerant to statins (severe myalgias)  Other Stroke Risk Factors  Advanced age  Former Cigarette smoker, quit smoking years ago   Coronary artery disease - non-STEMI w/ DES placement 2010 followed by [facility]  Other Active Problems  Baseline dementia, very functional prior to admission  Acute on chronic systolic and diastolic heart failure  COPD  Thrombocytopenia, chronic, baseline 90  Chronic kidney disease stage 3  Other Pertinent History  Hx colon cancer 2008  Hospital day # [place] for Pager information 12/04/2014 11:10 AM  I have personally examined this patient, reviewed notes, independently viewed imaging studies, participated in medical decision making and plan of care. I have made any additions  or clarifications directly to the above note. Agree with note above. Stroke workup is pending  Antony Contras, MD Medical Director [facility] Pager: [phone] 12/04/2014 2:28 PM    To contact Stroke Continuity provider, please refer to [email or web address] After hours, contact General Neurology

## 2014-12-04 NOTE — Progress Notes (Signed)
Occupational Therapy Treatment Patient Details Name: Aaron [name] MRN: [id number] DOB: [birth year] Today's Date: 12/04/2014    History of present illness 81 yo male admitted with R side weakness, facial droop with symptoms resolving with EMS arrival. CT (-) MRI (+) L posterior Frontal cortex and L MCA PMH: TBI, dementia, COPD, CAD, CKD, MI    OT comments  Pt states need to use bathroom upon arrival. Pt impulsive, trying to get out of recliner with foot rest up. Pt required 3 attempts to stand from chair due to loss of balance. Pt lost and corrected balance when walking to bathroom. Pt able to sit <> stand from toilet without loss of balance. Cuing needed to wash hands at sink, A and cuing required to use soap. Pt drifts left when walking and looks for rail or furniture to hold onto when walking.    Follow Up Recommendations  SNF;Supervision/Assistance - 24 hour    Equipment Recommendations       Recommendations for Other Services      Precautions / Restrictions Precautions Precautions: Fall Precaution Comments: impulsive       Mobility Bed Mobility               General bed mobility comments: pt up in chair upon arrival  Transfers Overall transfer level: Needs assistance Equipment used: None Transfers: Sit to/from Stand Sit to Stand: Min guard         General transfer comment: Pt required 3 attempts to stand from chair due to loss of balance. Pt able to stand from toilet without loss of balance, using grab bars.    Balance Overall balance assessment: Needs assistance         Standing balance support: During functional activity;No upper extremity supported   Standing balance comment: Pt leaned against sink for balance during task Single Leg Stance - Right Leg:  (unable without modA) Single Leg Stance - Left Leg:  (unable without mod A)         High level balance activites: Side stepping High Level Balance Comments: pt completed 20 feet to the L and R of  side stepping focused on large steps and bringing opposite foot together in one step   ADL Overall ADL's : Needs assistance/impaired     Grooming: Wash/dry hands;Standing;Minimal assistance (cuing to use soap) Grooming Details (indicate cue type and reason): cues to perform task but able to complete rinsing and drying hands. Min A for balance.                 Toilet Transfer: Minimal assistance;Ambulation;Regular Glass blower/designer Details (indicate cue type and reason): Pt maintained balance when standing from toilet Toileting- Clothing Manipulation and Hygiene: Min guard;Sit to/from stand       Functional mobility during ADLs: Minimal assistance;Cueing for safety General ADL Comments: Pt reported need to use toilet, implusive and trying to climb out of chair with foot rest up. Pt needed cuing to use soap when washing hands, able to maintain balance when washing hands at sink.      Vision                     Perception     Praxis      Cognition   Behavior During Therapy: Flat affect Overall Cognitive Status: Impaired/Different from baseline Area of Impairment: Attention;Following commands;Safety/judgement;Awareness;Problem solving;Memory   Current Attention Level: Focused Memory: Decreased short-term memory  Following Commands: Follows one step commands with increased time;Follows one step commands  inconsistently Safety/Judgement: Decreased awareness of safety;Decreased awareness of deficits Awareness: Intellectual Problem Solving: Slow processing;Decreased initiation;Difficulty sequencing;Requires verbal cues;Requires tactile cues General Comments: Pt HOH and impulsive through session    Extremity/Trunk Assessment               Exercises     Shoulder Instructions       General Comments      Pertinent Vitals/ Pain       Pain Assessment: No/denies pain  Home Living                                          Prior  Functioning/Environment              Frequency Min 2X/week     Progress Toward Goals  OT Goals(current goals can now be found in the care plan section)  Progress towards OT goals: Progressing toward goals  Acute Rehab OT Goals Patient Stated Goal: none stated OT Goal Formulation: With patient/family Time For Goal Achievement: 12/17/14 Potential to Achieve Goals: Good ADL Goals Pt Will Perform Upper Body Bathing: with min guard assist;sitting Pt Will Perform Lower Body Bathing: with min guard assist;sit to/from stand Pt Will Transfer to Toilet: with min guard assist;ambulating;bedside commode Additional ADL Goal #1: Pt will complete adl at sink level min guard (A)  Plan Discharge plan remains appropriate    Co-evaluation                 End of Session Equipment Utilized During Treatment: Gait belt   Activity Tolerance Patient tolerated treatment well   Patient Left in chair;with call bell/phone within reach;with chair alarm set;with family/visitor present   Nurse Communication          Time: 1105-1120 OT Time Calculation (min): 15 min  Charges:    Forest Gleason 12/04/2014, 11:28 AM

## 2014-12-04 NOTE — Clinical Social Work Placement (Signed)
   CLINICAL SOCIAL WORK PLACEMENT  NOTE  Date:  12/04/2014  Patient Details  Name: KERMAN PFOST MRN: [id number] Date of Birth: 1925-05-25  Clinical Social Work is seeking post-discharge placement for this patient at the [facility] level of care (*CSW will initial, date and re-position this form in  chart as items are completed):  Yes   Patient/family provided with [facility] Work Department's list of facilities offering this level of care within the geographic area requested by the patient (or if unable, by the patient's family).  Yes   Patient/family informed of their freedom to choose among providers that offer the needed level of care, that participate in Medicare, Medicaid or managed care program needed by the patient, have an available bed and are willing to accept the patient.  Yes   Patient/family informed of [facility]'s ownership interest in [facility] and [facility], as well as of the fact that they are under no obligation to receive care at these facilities.  PASRR submitted to EDS on 12/04/14     PASRR number received on 12/04/14     Existing PASRR number confirmed on       FL2 transmitted to all facilities in geographic area requested by pt/family on 12/04/14     FL2 transmitted to all facilities within larger geographic area on       Patient informed that his/her managed care company has contracts with or will negotiate with certain facilities, including the following:        Yes   Patient/family informed of bed offers received.  Patient chooses bed at [facility], [place]     Physician recommends and patient chooses bed at      Patient to be transferred to [facility], [place] on  .  Patient to be transferred to facility by       Patient family notified on   of transfer.  Name of family member notified:        PHYSICIAN       Additional Comment:     _______________________________________________ Greta Doom, LCSW 12/04/2014, 3:01 PM

## 2014-12-04 NOTE — Progress Notes (Signed)
Physical Therapy Treatment Patient Details Name: Aaron [name] MRN: [id number] DOB: 1924-10-21 Today's Date: 12/04/2014    History of Present Illness 81 yo male admitted with R side weakness, facial droop with symptoms resolving with EMS arrival. CT (-) MRI (+) L posterior Frontal cortex and L MCA PMH: TBI, dementia, COPD, CAD, CKD, MI     PT Comments    Pt con't to demo impaired balance, overall weakness R>L, impaired processing, decreased safety awareness, impaired memory, and impulsivity. Pt at high falls risk and is unsafe to return home with spouse at this time. Recommend CIR to achieve safe mod I level of function for safe transition home with spouse.   Follow Up Recommendations  CIR     Equipment Recommendations  None recommended by PT    Recommendations for Other Services       Precautions / Restrictions Precautions Precautions: Fall Precaution Comments: impulsive    Mobility  Bed Mobility               General bed mobility comments: pt up in chair upon arrival  Transfers Overall transfer level: Needs assistance Equipment used: Hemi-walker Transfers: Sit to/from Stand Sit to Stand: Min assist         General transfer comment: max verbal cues for safe use of hemi walker, v/c's to reach back for chair  Ambulation/Gait Ambulation/Gait assistance: Min assist Ambulation Distance (Feet): 60 Feet Assistive device: Hemi-walker Gait Pattern/deviations: Step-through pattern;Decreased stride length (decreased step heigth)   Gait velocity interpretation: <1.8 ft/sec, indicative of risk for recurrent falls General Gait Details: pt unsafe with hemi-walker, unable to use safely despite max verbal and tactile cues, pt amb with narrow base of support requiring max v/c's to increase BOS. pt freq requesting to sit down   Stairs            Wheelchair Mobility    Modified Rankin (Stroke Patients Only) Modified Rankin (Stroke Patients Only) Pre-Morbid  Rankin Score: Moderate disability Modified Rankin: Moderately severe disability     Balance Overall balance assessment: Needs assistance               Single Leg Stance - Right Leg:  (unable without modA) Single Leg Stance - Left Leg:  (unable without mod A)         High level balance activites: Side stepping High Level Balance Comments: pt completed 20 feet to the L and R of side stepping focused on large steps and bringing opposite foot together in one step    Cognition Arousal/Alertness: Awake/alert Behavior During Therapy: Flat affect Overall Cognitive Status: Impaired/Different from baseline Area of Impairment: Attention;Following commands;Safety/judgement;Awareness;Problem solving;Memory   Current Attention Level: Focused Memory: Decreased short-term memory Following Commands: Follows one step commands with increased time;Follows one step commands inconsistently Safety/Judgement: Decreased awareness of safety;Decreased awareness of deficits Awareness: Intellectual Problem Solving: Slow processing;Decreased initiation;Difficulty sequencing;Requires verbal cues;Requires tactile cues General Comments: Pt extremely HOH, resistive to therapy due to fatigue. max v/c's to stay on task and to use AD properly    Exercises      General Comments        Pertinent Vitals/Pain Pain Assessment: No/denies pain    Home Living                      Prior Function            PT Goals (current goals can now be found in the care plan section) Progress towards PT goals: Progressing  toward goals    Frequency  Min 3X/week    PT Plan Current plan remains appropriate    Co-evaluation             End of Session Equipment Utilized During Treatment: Gait belt Activity Tolerance: Patient limited by fatigue Patient left: in chair;with call bell/phone within reach;with chair alarm set;with family/visitor present     Time: 1000-1029 PT Time Calculation (min)  (ACUTE ONLY): 29 min  Charges:  $Gait Training: 8-22 mins $Neuromuscular Re-education: 8-22 mins                    G Codes:      Kingsley Callander 12/04/2014, 10:55 AM   Kittie Plater, PT, DPT Pager #: [phone] Office #: [phone]

## 2014-12-04 NOTE — Progress Notes (Signed)
[facility]                                                                              Patient Demographics  Aaron [name], is a 81 y.o. male, DOB - Nov 19, 1924, GBT:[id number]  Admit date - 12/02/2014   Admitting Physician Albertine Patricia, MD  Outpatient Primary MD for the patient is [name], MD  LOS - 2   Chief Complaint  Patient presents with  . Extremity Weakness       Brief HPI   Patient is a 81 year old male with remote history of TBI after falling off a roof for 41 years ago with subsequent right-sided deficit, also with ischemic cardiomyopathy with combined systolic and diastolic CHF EF of 71%, COPD and dyslipidemia presented to ED as his wife noticed worsening of right-sided deficits from baseline primarily the right hand from being very floppy on the morning of admission. She noticed symptoms around 7:30 AM but did not tell her daughter until 31 AM. In addition the patient fell when attempting to ambulate on the morning of admission.  No recent acute illnesses such as nausea vomiting diarrhea although patient has developed what is described as a "bad cough" recently but daughter unsure exactly when began. In ER patient was mildly febrile with a temperature of 99.3 but otherwise was hemodynamically stable, was on oxygen at 2 L/m saturating 96%. CT of the head unremarkable as was chest x-ray. Renal function at baseline with BUN 28 and creatinine 1.6. Hemoglobin stable at 15, no leukocytosis, patient did have mild thrombocytopenia 91,000. UA negative for UTI  Assessment & Plan    Principal Problem:  Acute CVA with right-sided weakness: Patient has chronic right-sided weakness, was worse at the time of admission with facial drooping or difficulty walking - MRI  brain showed 2 separate foci of acute infarction in the left posterior frontal cortex and subcortical white matter left MCA territory, nonhemorrhagic - MRA showed no large vessel occlusion -  LDL 116 - Neurology consulted, recommended aspirin 325 mg daily -2-D echo pending -  carotid Dopplers showed 1-39% ICA stenosis - PT evaluation recommending inpatient rehabilitation, consult placed. Discussed with daughter at the bedside, she requested for [facility] skilled nursing facility if declined by inpatient rehabilitation.    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, NYHA class 2 -Compensated -  BP improving, may restart oral Lasix tomorrow - 2-D echo pending   COPD (chronic obstructive pulmonary disease) with emphysema - Stable, no wheezing, continue Spiriva, Continue Tessalon Perles, Robitussin for coughing   Thrombocytopenia -Chronic and at baseline of around 90,000   Dyslipidemia LDL 116, continue statin   CKD (chronic kidney disease), stage III - Unknown baseline, creatinine currently 1.6, follow closely, holding Lasix  Code Status: DO NOT RESUSCITATE   Family Communication: Discussed in detail with the patient, all imaging results, lab results explained to the patient and daughter   Disposition Plan: SNF vs CIR  Time Spent in minutes  25 minutes  Procedures  MRI/MRA  Consults   neuro  DVT Prophylaxis  SCD's  Medications  Scheduled Meds: . aspirin  300 mg Rectal Daily   Or  . aspirin EC  325 mg Oral Daily  . benzonatate  100 mg Oral TID  . tiotropium  18 mcg Inhalation Daily   Continuous Infusions:  PRN Meds:.guaiFENesin-dextromethorphan, senna-docusate   Antibiotics   Anti-infectives    None        Subjective:   Aleck Locklin was seen and examined today.Feeling better today, eating breakfast without any coughing or aspiration. Daughter at the bedside.   Patient denies dizziness, chest pain, shortness of breath,  abdominal pain, N/V/D/C.   Objective:   Blood pressure 117/61, pulse 85, temperature 99 F (37.2 C), temperature source Oral, resp. rate 16, height 5\' 4"  (1.626 m), weight 67.4 kg (148 lb 9.4 oz), SpO2 90 %.  Wt Readings from Last 3 Encounters:  12/02/14 67.4 kg (148 lb 9.4 oz)  10/03/14 68.947 kg (152 lb)  05/24/14 68.947 kg (152 lb)     Intake/Output Summary (Last 24 hours) at 12/04/14 1201 Last data filed at 12/04/14 0910  Gross per 24 hour  Intake    240 ml  Output      0 ml  Net    240 ml    Exam  General: Alert and oriented to self, NADIntermittently follows commands   HEENT::  PERRLA, EOMI  Neck: Supple, no JVD, no masses  CVS: S1 S2 auscultated, no rubs, murmurs or gallops. Regular rate and rhythm.  Respiratory: Clear to auscultation bilaterally, no wheezing, rales or rhonchi  Abdomen: Soft, nontender, nondistended, + bowel sounds  Ext: no cyanosis clubbing or edema  Neuro:  alert and oriented, right upper and lower extremity 4/5, left upper and lower extremity 5/5   Skin: No rashes  Psych: Sleepy, tired, although he worked with physical therapy before my encounter   Data Review   Micro Results No results found for this or any previous visit (from the past 240 hour(s)).  Radiology Reports Dg Chest 2 View  12/02/2014   CLINICAL DATA:  Woke up this morning with right arm and hand weakness, felt like it was numb. EMS called to house for fall, which was a buckling of right leg; went down to right knee, but did not fall to floor. Last seen normal 11pm last night. Able to walk, but right leg weaker. Alert and oriented on arrival; very hard of hearing. Denies pain.  EXAM: CHEST - 2 VIEW  COMPARISON:  08/13/2009  FINDINGS: Lungs are clear. Mild cardiomegaly. Atheromatous aorta. Partially calcified pleural plaques. No pneumothorax. No effusion. Spurring in the lower thoracic spine.  IMPRESSION: Mild cardiomegaly.   Electronically Signed   By: Lucrezia Europe M.D.   On:  12/02/2014 12:03   Ct Head Wo Contrast  12/02/2014   CLINICAL DATA:  Right arm weakness starting this morning  EXAM: CT HEAD WITHOUT CONTRAST  TECHNIQUE: Contiguous axial images were obtained from the base of the skull through the vertex without intravenous contrast.  COMPARISON:  None.  FINDINGS: No skull fracture is noted. Paranasal sinuses and mastoid air cells are unremarkable. Mild atherosclerotic calcifications of carotid siphon.  No intracranial hemorrhage, mass effect or midline shift.  Moderate cerebral atrophy. Mild periventricular white matter decreased attenuation probable due to chronic small vessel ischemic changes. Moderate cerebral atrophy. No acute cortical infarction. No mass lesion is noted on this unenhanced scan.  IMPRESSION: No acute intracranial abnormality. Moderate cerebral atrophy. Mild periventricular white matter decreased attenuation probable due to chronic small vessel ischemic changes. No definite acute cortical infarction.   Electronically Signed   By: Lahoma Crocker M.D.   On: 12/02/2014 10:59   Mr Jodene Nam Head Wo Contrast  12/02/2014   CLINICAL DATA:  Patient with history of RIGHT-sided deficits after falling off a roof several years ago, baseline mild RIGHT-sided weakness, developed worsening RIGHT-sided weakness earlier today, with facial droop and difficulty walking. Partial resolution of symptoms.  EXAM: MRI HEAD WITHOUT CONTRAST  MRA HEAD WITHOUT CONTRAST  TECHNIQUE: Multiplanar, multiecho pulse sequences of the brain and surrounding structures were obtained without intravenous contrast. Angiographic images of the head were obtained using MRA technique without contrast.  COMPARISON:  CT head 04/04/2015.  FINDINGS: The patient could not cooperate to remain motionless for the entire exam. Several images/series are either motion degraded or could not be acquired.  MRI HEAD FINDINGS  Two separate foci of LEFT posterior frontal cortical and subcortical restricted diffusion consistent  with acute infarction. These lie within the LEFT MCA territory.  No mass lesion, hydrocephalus, or extra-axial fluid. Advanced atrophy is present.  No acute or chronic hemorrhage is evident based on the limited pulse sequences.  MRA HEAD FINDINGS  Dolichoectatic but widely patent internal carotid arteries and basilar artery. LEFT vertebral dominant. RIGHT vertebral diminutive, appears to primarily contribute to the RIGHT inferior cerebellar circulation.  No intracranial large vessel occlusion, flow-limiting stenosis, or visible aneurysm.  IMPRESSION: Prematurely truncated and motion degraded exam demonstrating two separate foci of acute infarction in the LEFT posterior frontal cortex and subcortical white matter, LEFT MCA territory. These are nonhemorrhagic.  No large vessel occlusion is evident.  No acute or chronic hemorrhage is observed.   Electronically Signed   By: Rolla Flatten M.D.   On: 12/02/2014 15:10   Mr Brain Wo Contrast  12/02/2014   CLINICAL DATA:  Patient with history of RIGHT-sided deficits after falling off a roof several years ago, baseline mild RIGHT-sided weakness, developed worsening RIGHT-sided weakness earlier today, with facial droop and difficulty walking. Partial resolution of symptoms.  EXAM: MRI HEAD WITHOUT CONTRAST  MRA HEAD WITHOUT CONTRAST  TECHNIQUE: Multiplanar, multiecho pulse sequences of the brain and surrounding structures were obtained without intravenous contrast. Angiographic images of the head were obtained using MRA technique without contrast.  COMPARISON:  CT head 04/04/2015.  FINDINGS: The patient could not cooperate to remain motionless for the entire exam. Several images/series are either motion degraded or could not be acquired.  MRI HEAD FINDINGS  Two separate foci of LEFT posterior frontal cortical and subcortical restricted diffusion consistent with acute infarction. These lie within the LEFT MCA territory.  No mass lesion, hydrocephalus, or extra-axial fluid.  Advanced atrophy is present.  No acute or chronic hemorrhage is evident based on the limited pulse sequences.  MRA HEAD FINDINGS  Dolichoectatic but widely patent internal carotid arteries and basilar artery. LEFT vertebral dominant. RIGHT vertebral diminutive, appears to primarily contribute to the RIGHT inferior cerebellar circulation.  No intracranial large vessel occlusion, flow-limiting stenosis, or visible aneurysm.  IMPRESSION: Prematurely truncated and motion degraded exam demonstrating two separate foci of acute infarction in the LEFT posterior frontal cortex and subcortical white matter, LEFT MCA territory. These are nonhemorrhagic.  No large vessel occlusion is evident.  No acute or chronic hemorrhage is observed.   Electronically Signed   By: Rolla Flatten M.D.   On: 12/02/2014 15:10  CBC  Recent Labs Lab 12/02/14 1114 12/02/14 1132  WBC 7.0  --   HGB 14.1 15.3  HCT 41.6 45.0  PLT 91*  --   MCV 92.7  --   MCH 31.4  --   MCHC 33.9  --   RDW 14.3  --   LYMPHSABS 1.7  --   MONOABS 1.0  --   EOSABS 0.1  --   BASOSABS 0.0  --     Chemistries   Recent Labs Lab 12/02/14 1114 12/02/14 1132 12/03/14 1325 12/04/14 0635  NA 137 140 136 140  K 4.3 4.2 4.3 3.7  CL 104 104 106 106  CO2 24  --  24 24  GLUCOSE 98 99 186* 94  BUN 27* 28* 28* 23*  CREATININE 1.68* 1.60* 1.59* 1.42*  CALCIUM 8.3*  --  8.7* 8.7*  AST 22  --   --   --   ALT 15*  --   --   --   ALKPHOS 97  --   --   --   BILITOT 0.5  --   --   --    ------------------------------------------------------------------------------------------------------------------ estimated creatinine clearance is 30.7 mL/min (by C-G formula based on Cr of 1.42). ------------------------------------------------------------------------------------------------------------------ No results for input(s): HGBA1C in the last 72  hours. ------------------------------------------------------------------------------------------------------------------  Recent Labs  12/03/14 0817  CHOL 169  HDL 41  LDLCALC 116*  TRIG 59  CHOLHDL 4.1   ------------------------------------------------------------------------------------------------------------------ No results for input(s): TSH, T4TOTAL, T3FREE, THYROIDAB in the last 72 hours.  Invalid input(s): FREET3 ------------------------------------------------------------------------------------------------------------------ No results for input(s): VITAMINB12, FOLATE, FERRITIN, TIBC, IRON, RETICCTPCT in the last 72 hours.  Coagulation profile  Recent Labs Lab 12/02/14 1114  INR 1.06    No results for input(s): DDIMER in the last 72 hours.  Cardiac Enzymes No results for input(s): CKMB, TROPONINI, MYOGLOBIN in the last 168 hours.  Invalid input(s): CK ------------------------------------------------------------------------------------------------------------------ Invalid input(s): POCBNP  No results for input(s): GLUCAP in the last 72 hours.   Donevan Biller M.D. [facility] 12/04/2014, 12:01 PM  Pager: [phone]   Between 7am to 7pm - call Pager - [phone]  After 7pm go to [email or web address] - password [email or web address]  Call night coverage person covering after 7pm

## 2014-12-04 NOTE — Clinical Social Work Note (Signed)
Clinical Social Work Assessment  Patient Details  Name: Aaron [name] MRN: [id number] Date of Birth: 15-Jan-1925  Date of referral:  12/04/14               Reason for consult:  Facility Placement                Housing/Transportation Living arrangements for the past 2 months:  Single Family Home Source of Information:  Adult Children Aaron [name]) Patient Interpreter Needed:  None Criminal Activity/Legal Involvement Pertinent to Current Situation/Hospitalization:  No - Comment as needed Significant Relationships:  Adult Children, Spouse Lives with:  Spouse Do you feel safe going back to the place where you live?  No Need for family participation in patient care:  Yes (Comment)  Care giving concerns:  Pt's daughter Aaron [name] reported that her mother can not provided care for the pt at home alone.   Social Worker assessment / plan: CSW spoke with the pt's daughter Aaron [name]. CSW introduced self and purpose of the call. CSW discussed SNF rehab. CSW explained the SNF process. CSW explained insurance and its relation to SNF placement. Aaron [name] reported that she would like the pt to go to [facility]. CSW answered all questions in which the Aaron [name] inquired about. CSW will continue to follow this pt and assist with discharge as needed.   Employment status:  Retired Forensic scientist:  Managed Care  PT Recommendations:  [place] / Referral to community resources:  SBIRT  Patient/Family's Response to care:  Aaron [name] acknowledged the care in which the pt has received has been great.    Patient/Family's Understanding of and Emotional Response to Diagnosis, Current Treatment, and Prognosis:  Aaron [name] reported her hope for the pt is to get him back home. Aaron [name] acknowledged that the pt's current diagnosis was unexpected.   Emotional Assessment Appearance:  Unable to Assess  Attitude/Demeanor/Rapport:  Unable to Assess Affect (typically observed):   Unable to Assess Orientation:  Oriented to Self Alcohol / Substance use:  Not Applicable Psych involvement (Current and /or in the community):  No (Comment)  Discharge Needs  Concerns to be addressed:  Denies Needs/Concerns at this time Readmission within the last 30 days:  No Current discharge risk:  None Barriers to Discharge:  No Barriers Identified   Aaron Ballester, LCSW 12/04/2014, 2:51 PM

## 2014-12-04 NOTE — Progress Notes (Signed)
Chart reviewed and daughter reports another bad night due to confusion. She reports that patient has dementia at baseline, was sedentary as "sits around all day" and wife is unable to provide any assistance at discharge. Discussed CIR and length of stay. She would prefer longer length of stay to help progress to  Independent level. She would prefer therapy at [facility] SNF as is familiar with them from her mother's rehab stay there. Will defer CIR consult.

## 2014-12-04 NOTE — Progress Notes (Signed)
  Echocardiogram 2D Echocardiogram has been performed.  Donata Clay 12/04/2014, 12:32 PM

## 2014-12-05 DIAGNOSIS — N183 Chronic kidney disease, stage 3 (moderate): Secondary | ICD-10-CM

## 2014-12-05 DIAGNOSIS — E785 Hyperlipidemia, unspecified: Secondary | ICD-10-CM | POA: Diagnosis not present

## 2014-12-05 DIAGNOSIS — I6789 Other cerebrovascular disease: Secondary | ICD-10-CM | POA: Diagnosis not present

## 2014-12-05 DIAGNOSIS — R509 Fever, unspecified: Secondary | ICD-10-CM | POA: Diagnosis not present

## 2014-12-05 DIAGNOSIS — D696 Thrombocytopenia, unspecified: Secondary | ICD-10-CM | POA: Diagnosis not present

## 2014-12-05 DIAGNOSIS — I2510 Atherosclerotic heart disease of native coronary artery without angina pectoris: Secondary | ICD-10-CM | POA: Diagnosis not present

## 2014-12-05 DIAGNOSIS — G4710 Hypersomnia, unspecified: Secondary | ICD-10-CM | POA: Diagnosis not present

## 2014-12-05 DIAGNOSIS — J439 Emphysema, unspecified: Secondary | ICD-10-CM | POA: Diagnosis not present

## 2014-12-05 DIAGNOSIS — R05 Cough: Secondary | ICD-10-CM | POA: Diagnosis not present

## 2014-12-05 DIAGNOSIS — I69959 Hemiplegia and hemiparesis following unspecified cerebrovascular disease affecting unspecified side: Secondary | ICD-10-CM | POA: Diagnosis not present

## 2014-12-05 DIAGNOSIS — I255 Ischemic cardiomyopathy: Secondary | ICD-10-CM | POA: Diagnosis not present

## 2014-12-05 DIAGNOSIS — I639 Cerebral infarction, unspecified: Secondary | ICD-10-CM | POA: Diagnosis not present

## 2014-12-05 DIAGNOSIS — M6289 Other specified disorders of muscle: Secondary | ICD-10-CM | POA: Diagnosis not present

## 2014-12-05 DIAGNOSIS — I5040 Unspecified combined systolic (congestive) and diastolic (congestive) heart failure: Secondary | ICD-10-CM | POA: Diagnosis not present

## 2014-12-05 MED ORDER — BENZONATATE 100 MG PO CAPS: 100.0000 mg | ORAL_CAPSULE | Freq: Three times a day (TID) | ORAL | Status: DC | PRN

## 2014-12-05 MED ORDER — FUROSEMIDE 20 MG PO TABS: 40.0000 mg | ORAL_TABLET | Freq: Every day | ORAL | Status: DC | PRN

## 2014-12-05 MED ORDER — SENNOSIDES-DOCUSATE SODIUM 8.6-50 MG PO TABS: 1.0000 | ORAL_TABLET | Freq: Every evening | ORAL | Status: DC | PRN

## 2014-12-05 MED ORDER — FUROSEMIDE 20 MG PO TABS: 40.0000 mg | ORAL_TABLET | Freq: Every day | ORAL | Status: AC

## 2014-12-05 MED ORDER — ASPIRIN 325 MG PO TBEC: 325.0000 mg | DELAYED_RELEASE_TABLET | Freq: Every day | ORAL | Status: DC

## 2014-12-05 MED ORDER — POTASSIUM CHLORIDE CRYS ER 20 MEQ PO TBCR: 20.0000 meq | EXTENDED_RELEASE_TABLET | Freq: Every day | ORAL | Status: AC

## 2014-12-05 MED ORDER — POTASSIUM CHLORIDE CRYS ER 20 MEQ PO TBCR: 20.0000 meq | EXTENDED_RELEASE_TABLET | Freq: Every day | ORAL | Status: DC | PRN

## 2014-12-05 NOTE — Progress Notes (Signed)
Physical Therapy Treatment Patient Details Name: Aaron [name] MRN: [id number] DOB: [birth year] Today's Date: 12/05/2014    History of Present Illness 81 yo male admitted with R side weakness, facial droop with symptoms resolving with EMS arrival. CT (-) MRI (+) L posterior Frontal cortex and L MCA PMH: TBI, dementia, COPD, CAD, CKD, MI     PT Comments    Patient seen for mobility progression and gait training. Patient continues to demonstrate deficits with gait. Performed 2 trials of gait retraining this session. Patient tolerated well. Will continue to see and progress as tolerated. Daughters present throughout session.  Follow Up Recommendations  SNF     Equipment Recommendations  None recommended by PT    Recommendations for Other Services       Precautions / Restrictions Precautions Precautions: Fall Precaution Comments: impulsive    Mobility  Bed Mobility               General bed mobility comments: pt up in chair upon arrival  Transfers Overall transfer level: Needs assistance Equipment used: None Transfers: Sit to/from Stand Sit to Stand: Min guard         General transfer comment: Pt required 3 attempts to stand from chair due to loss of balance. Pt able to stand from toilet without loss of balance, using grab bars.  Ambulation/Gait Ambulation/Gait assistance: Min assist Ambulation Distance (Feet): 110 Feet (x2) Assistive device: 1 person hand held assist Gait Pattern/deviations: Step-through pattern;Decreased stride length;Shuffle;Decreased dorsiflexion - right;Staggering left;Staggering right;Drifts right/left;Trunk flexed;Narrow base of support Gait velocity: decreased Gait velocity interpretation: <1.8 ft/sec, indicative of risk for recurrent falls General Gait Details: patient continues to demonstrate difficulty with mobility, patient with continued LOB requiring assist for stability. VCs for increased gait width   Stairs             Wheelchair Mobility    Modified Rankin (Stroke Patients Only)       Balance Overall balance assessment: History of Falls         Standing balance support: During functional activity                        Cognition Arousal/Alertness: Awake/alert Behavior During Therapy: Flat affect Overall Cognitive Status: Impaired/Different from baseline Area of Impairment: Attention;Following commands;Safety/judgement;Awareness;Problem solving;Memory   Current Attention Level: Focused   Following Commands: Follows one step commands with increased time;Follows one step commands inconsistently Safety/Judgement: Decreased awareness of safety;Decreased awareness of deficits     General Comments: Pt HOH and impulsive through session    Exercises      General Comments        Pertinent Vitals/Pain  no pain at this time    Home Living                      Prior Function            PT Goals (current goals can now be found in the care plan section) Acute Rehab PT Goals Patient Stated Goal: none stated PT Goal Formulation: With family Time For Goal Achievement: 12/17/14 Potential to Achieve Goals: Fair Progress towards PT goals: Progressing toward goals    Frequency  Min 3X/week    PT Plan Current plan remains appropriate    Co-evaluation             End of Session Equipment Utilized During Treatment: Gait belt Activity Tolerance: Patient limited by fatigue Patient left: in chair;with call bell/phone  within reach;with chair alarm set;with family/visitor present     Time: 7322-0254 PT Time Calculation (min) (ACUTE ONLY): 17 min  Charges:  $Gait Training: 8-22 mins                    G CodesDuncan [name] 12/30/14, 1:42 PM Aaron [name], [place] DPT  [phone]

## 2014-12-05 NOTE — Clinical Social Work Placement (Signed)
   CLINICAL SOCIAL WORK PLACEMENT  NOTE  Date:  12/05/2014  Patient Details  Name: Aaron [name] MRN: [id number] Date of Birth: 03-06-1925  Clinical Social Work is seeking post-discharge placement for this patient at the [place] level of care (*CSW will initial, date and re-position this form in  chart as items are completed):  Yes   Patient/family provided with [facility] Work Department's list of facilities offering this level of care within the geographic area requested by the patient (or if unable, by the patient's family).  Yes   Patient/family informed of their freedom to choose among providers that offer the needed level of care, that participate in Medicare, Medicaid or managed care program needed by the patient, have an available bed and are willing to accept the patient.  Yes   Patient/family informed of [facility] ownership interest in [facility] and [facility], as well as of the fact that they are under no obligation to receive care at these facilities.  PASRR submitted to EDS on 12/04/14     PASRR number received on 12/04/14     Existing PASRR number confirmed on       FL2 transmitted to all facilities in geographic area requested by pt/family on 12/04/14     FL2 transmitted to all facilities within larger geographic area on       Patient informed that his/her managed care company has contracts with or will negotiate with certain facilities, including the following:        Yes   Patient/family informed of bed offers received.  Patient chooses bed at [place], [place]     Physician recommends and patient chooses bed at      Patient to be transferred to [place], [place] on 12/05/14.  Patient to be transferred to facility by PT[facility]      Patient family notified on 12/05/14 of transfer.  Name of family member notified:  Pt daughter Alric Quan      PHYSICIAN       Additional Comment:     _______________________________________________ Greta Doom, LCSW 12/05/2014, 12:02 PM

## 2014-12-05 NOTE — Progress Notes (Signed)
Discharge orders received, pt for discharge today to SNF.  IV and telemetry D/C.  D/C instructions and Rx in packet for receiving facility and report called to SNF.  Family at the bedside to assist with discharge. PTAR brought pt downstairs via stretcher.

## 2014-12-05 NOTE — Discharge Summary (Signed)
Physician Discharge Summary  Aaron [name] KDT:[id number] DOB: August 29, 1924 DOA: 12/02/2014  PCP: [name], MD  Admit date: 12/02/2014 Discharge date: 12/05/2014  Time spent: 35 minutes  Recommendations for Outpatient Follow-up:  1. BMp 1 week- may need adjustment of lasix 2. TO SNF  Discharge Diagnoses:  Principal Problem:   CVA (cerebral infarction) Active Problems:   Dyslipidemia   HYPERSOMNIA, PERSISTENT   CAD, NATIVE VESSEL   Chronic combined systolic (EF 99%) and diastolic CHF, NYHA class 2   COPD (chronic obstructive pulmonary disease) with emphysema   History of traumatic brain injury   CKD (chronic kidney disease), stage III   Thrombocytopenia   Right sided weakness   Discharge Condition: improved  Diet recommendation: cardiac  Filed Weights   12/02/14 2000  Weight: 67.4 kg (148 lb 9.4 oz)    History of present illness:  This is a 81 year old male patient with remote history of traumatic brain injury after falling off a roof 41 years ago with subsequent right-sided deficits. Also history of ischemic cardiomyopathy with combined systolic and diastolic heart failure with an EF of 35%, COPD and dyslipidemia. Brought to ER today by family because wife noticed worsening of right-sided deficits from baseline primarily the right hand from being very floppy this morning. She noticed symptoms around 7:30 AM but did not tell her daughter until 31 AM. In addition the patient fell when attempting to ambulate earlier this morning. No recent acute illnesses such as nausea vomiting diarrhea although patient has developed what is described as a "bad cough" recently but daughter unsure exactly when began. Patient reports has a good appetite.  In ER patient was mildly febrile with a temperature of 99.3 but otherwise was hemodynamically stable, was on oxygen at 2 L/m saturating 96%. CT of the head unremarkable as was chest x-ray. Renal function at baseline with BUN 28 and creatinine  1.6. Hemoglobin stable at 15, no leukocytosis, patient did have mild thrombus cytopenia platelets 91,000. Urinalysis was unremarkable, glucose 99, call less than 5. Neurologist evaluation pending.    Hospital Course:  Acute CVA with right-sided weakness: Patient has chronic right-sided weakness, was worse at the time of admission with facial drooping or difficulty walking - MRI brain showed 2 separate foci of acute infarction in the left posterior frontal cortex and subcortical white matter left MCA territory, nonhemorrhagic - MRA showed no large vessel occlusion - LDL 116- allergic to statin - Neurology consulted, recommended aspirin 325 mg daily -2-D echo below - carotid Dopplers showed 1-39% ICA stenosis - [place]   History of traumatic brain injury with the chronic right-sided deficits - Continue physical therapy   CAD, NATIVE VESSEL -No apparent ischemic symptoms -Initial troponin and EKG normal    Chronic combined systolic (EF 83%) and diastolic CHF, NYHA class 2 -Compensated - 2-D echo done -restart lasix   COPD (chronic obstructive pulmonary disease) with emphysema - Stable, no wheezing, continue Spiriva, Continue Tessalon Perles, Robitussin for coughing   Thrombocytopenia -Chronic and at baseline of around 90,000   Dyslipidemia LDL 116- allergic to statin   CKD (chronic kidney disease), stage III - Unknown baseline, creatinine currently 1.6, follow closely, holding Lasix  Procedures:  Echo: Study Conclusions  - Left ventricle: The cavity size was normal. Wall thickness was normal. Systolic function was moderately reduced. The estimated ejection fraction was in the range of 35% to 40%. There is inferior, inferoapical and inferoseptal akinesis, suggestive of prior inferior infarct. Doppler parameters are consistent with abnormal left ventricular  relaxation (grade 1 diastolic dysfunction). The E/e&' ratio is between 8-15,  suggesting indeterminate LV filling pressure. - Aortic valve: Sclerosis without stenosis. There was mild regurgitation. - Mitral valve: Mildly thickened leaflets . There was mild regurgitation. - Left atrium: The atrium was moderately dilated at 42 ml/m2. - Tricuspid valve: There was mild regurgitation. - Pulmonary arteries: PA peak pressure: 35 mm Hg (S). - Inferior vena cava: The vessel was normal in size. The respirophasic diameter changes were in the normal range (>= 50%), consistent with normal central venous pressure.  Impressions:  - Compared to the prior echo in 2014, there are few changes. There is evidence for prior inferior MI. LV filling pressure is borderline elevated. There is mild pulmonary hypertension, RVSP 35 mmHg.  Consultations:  neuro  Discharge Exam: Filed Vitals:   12/05/14 0612  BP: 120/64  Pulse: 86  Temp: 98.5 F (36.9 C)  Resp: 18    General: hard of hearing Cardiovascular: rrr Respiratory: clear  Discharge Instructions   Discharge Instructions    Diet - low sodium heart healthy    Complete by:  As directed      Increase activity slowly    Complete by:  As directed           Current Discharge Medication List    START taking these medications   Details  benzonatate (TESSALON) 100 MG capsule Take 1 capsule (100 mg total) by mouth 3 (three) times daily as needed for cough. Qty: 20 capsule, Refills: 0    senna-docusate (SENOKOT-S) 8.6-50 MG per tablet Take 1 tablet by mouth at bedtime as needed for mild constipation.      CONTINUE these medications which have CHANGED   Details  aspirin EC 325 MG EC tablet Take 1 tablet (325 mg total) by mouth daily. Qty: 30 tablet, Refills: 0    furosemide (LASIX) 20 MG tablet Take 2 tablets (40 mg total) by mouth daily. Or as directed Qty: 60 tablet, Refills: 11    potassium chloride SA (K-DUR,KLOR-CON) 20 MEQ tablet Take 1 tablet (20 mEq total) by mouth daily. Qty: 30 tablet,  Refills: 11      CONTINUE these medications which have NOT CHANGED   Details  Ascorbic Acid (VITAMIN C) 500 MG tablet Take 500 mg by mouth daily.     Cholecalciferol (VITAMIN D) 2000 UNIT CAPS Take 2,000 Units by mouth daily.     tiotropium (SPIRIVA HANDIHALER) 18 MCG inhalation capsule Place 1 capsule (18 mcg total) into inhaler and inhale daily. Qty: 30 capsule, Refills: 6    vitamin B-12 (CYANOCOBALAMIN) 100 MCG tablet Take 100 mcg by mouth daily.      vitamin E 400 UNIT capsule Take 400 Units by mouth daily.      Spacer/Aero-Holding Chambers (AEROCHAMBER Z-STAT PLUS CHAMBR) MISC by Does not apply route. Use as directed with MDI       Allergies  Allergen Reactions  . Statins Other (See Comments)    Severe myalgias   Follow-up Information    Follow up with [name], MD In 1 week.   Specialty:  Internal Medicine   Contact information:   [address] [place] [address] [phone]        The results of significant diagnostics from this hospitalization (including imaging, microbiology, ancillary and laboratory) are listed below for reference.    Significant Diagnostic Studies: Dg Chest 2 View  12/02/2014   CLINICAL DATA:  Woke up this morning  with right arm and hand weakness, felt like it was numb. EMS called to house for fall, which was a buckling of right leg; went down to right knee, but did not fall to floor. Last seen normal 11pm last night. Able to walk, but right leg weaker. Alert and oriented on arrival; very hard of hearing. Denies pain.  EXAM: CHEST - 2 VIEW  COMPARISON:  08/13/2009  FINDINGS: Lungs are clear. Mild cardiomegaly. Atheromatous aorta. Partially calcified pleural plaques. No pneumothorax. No effusion. Spurring in the lower thoracic spine.  IMPRESSION: Mild cardiomegaly.   Electronically Signed   By: Lucrezia Europe M.D.   On: 12/02/2014 12:03   Ct Head Wo Contrast  12/02/2014   CLINICAL DATA:  Right arm weakness  starting this morning  EXAM: CT HEAD WITHOUT CONTRAST  TECHNIQUE: Contiguous axial images were obtained from the base of the skull through the vertex without intravenous contrast.  COMPARISON:  None.  FINDINGS: No skull fracture is noted. Paranasal sinuses and mastoid air cells are unremarkable. Mild atherosclerotic calcifications of carotid siphon.  No intracranial hemorrhage, mass effect or midline shift.  Moderate cerebral atrophy. Mild periventricular white matter decreased attenuation probable due to chronic small vessel ischemic changes. Moderate cerebral atrophy. No acute cortical infarction. No mass lesion is noted on this unenhanced scan.  IMPRESSION: No acute intracranial abnormality. Moderate cerebral atrophy. Mild periventricular white matter decreased attenuation probable due to chronic small vessel ischemic changes. No definite acute cortical infarction.   Electronically Signed   By: Lahoma Crocker M.D.   On: 12/02/2014 10:59   Mr Jodene Nam Head Wo Contrast  12/02/2014   CLINICAL DATA:  Patient with history of RIGHT-sided deficits after falling off a roof several years ago, baseline mild RIGHT-sided weakness, developed worsening RIGHT-sided weakness earlier today, with facial droop and difficulty walking. Partial resolution of symptoms.  EXAM: MRI HEAD WITHOUT CONTRAST  MRA HEAD WITHOUT CONTRAST  TECHNIQUE: Multiplanar, multiecho pulse sequences of the brain and surrounding structures were obtained without intravenous contrast. Angiographic images of the head were obtained using MRA technique without contrast.  COMPARISON:  CT head 04/04/2015.  FINDINGS: The patient could not cooperate to remain motionless for the entire exam. Several images/series are either motion degraded or could not be acquired.  MRI HEAD FINDINGS  Two separate foci of LEFT posterior frontal cortical and subcortical restricted diffusion consistent with acute infarction. These lie within the LEFT MCA territory.  No mass lesion,  hydrocephalus, or extra-axial fluid. Advanced atrophy is present.  No acute or chronic hemorrhage is evident based on the limited pulse sequences.  MRA HEAD FINDINGS  Dolichoectatic but widely patent internal carotid arteries and basilar artery. LEFT vertebral dominant. RIGHT vertebral diminutive, appears to primarily contribute to the RIGHT inferior cerebellar circulation.  No intracranial large vessel occlusion, flow-limiting stenosis, or visible aneurysm.  IMPRESSION: Prematurely truncated and motion degraded exam demonstrating two separate foci of acute infarction in the LEFT posterior frontal cortex and subcortical white matter, LEFT MCA territory. These are nonhemorrhagic.  No large vessel occlusion is evident.  No acute or chronic hemorrhage is observed.   Electronically Signed   By: Rolla Flatten M.D.   On: 12/02/2014 15:10   Mr Brain Wo Contrast  12/02/2014   CLINICAL DATA:  Patient with history of RIGHT-sided deficits after falling off a roof several years ago, baseline mild RIGHT-sided weakness, developed worsening RIGHT-sided weakness earlier today, with facial droop and difficulty walking. Partial resolution of symptoms.  EXAM: MRI HEAD WITHOUT CONTRAST  MRA HEAD WITHOUT CONTRAST  TECHNIQUE: Multiplanar, multiecho pulse sequences of the brain and surrounding structures were obtained without intravenous contrast. Angiographic images of the head were obtained using MRA technique without contrast.  COMPARISON:  CT head 04/04/2015.  FINDINGS: The patient could not cooperate to remain motionless for the entire exam. Several images/series are either motion degraded or could not be acquired.  MRI HEAD FINDINGS  Two separate foci of LEFT posterior frontal cortical and subcortical restricted diffusion consistent with acute infarction. These lie within the LEFT MCA territory.  No mass lesion, hydrocephalus, or extra-axial fluid. Advanced atrophy is present.  No acute or chronic hemorrhage is evident based on  the limited pulse sequences.  MRA HEAD FINDINGS  Dolichoectatic but widely patent internal carotid arteries and basilar artery. LEFT vertebral dominant. RIGHT vertebral diminutive, appears to primarily contribute to the RIGHT inferior cerebellar circulation.  No intracranial large vessel occlusion, flow-limiting stenosis, or visible aneurysm.  IMPRESSION: Prematurely truncated and motion degraded exam demonstrating two separate foci of acute infarction in the LEFT posterior frontal cortex and subcortical white matter, LEFT MCA territory. These are nonhemorrhagic.  No large vessel occlusion is evident.  No acute or chronic hemorrhage is observed.   Electronically Signed   By: Rolla Flatten M.D.   On: 12/02/2014 15:10    Microbiology: No results found for this or any previous visit (from the past 240 hour(s)).   Labs: Basic Metabolic Panel:  Recent Labs Lab 12/02/14 1114 12/02/14 1132 12/03/14 1325 12/04/14 0635  NA 137 140 136 140  K 4.3 4.2 4.3 3.7  CL 104 104 106 106  CO2 24  --  24 24  GLUCOSE 98 99 186* 94  BUN 27* 28* 28* 23*  CREATININE 1.68* 1.60* 1.59* 1.42*  CALCIUM 8.3*  --  8.7* 8.7*   Liver Function Tests:  Recent Labs Lab 12/02/14 1114  AST 22  ALT 15*  ALKPHOS 97  BILITOT 0.5  PROT 6.1*  ALBUMIN 2.9*   No results for input(s): LIPASE, AMYLASE in the last 168 hours. No results for input(s): AMMONIA in the last 168 hours. CBC:  Recent Labs Lab 12/02/14 1114 12/02/14 1132  WBC 7.0  --   NEUTROABS 4.2  --   HGB 14.1 15.3  HCT 41.6 45.0  MCV 92.7  --   PLT 91*  --    Cardiac Enzymes: No results for input(s): CKTOTAL, CKMB, CKMBINDEX, TROPONINI in the last 168 hours. BNP: BNP (last 3 results) No results for input(s): BNP in the last 8760 hours.  ProBNP (last 3 results) No results for input(s): PROBNP in the last 8760 hours.  CBG: No results for input(s): GLUCAP in the last 168 hours.     SignedEulogio Bear  [facility] 12/05/2014,  10:04 AM

## 2014-12-05 NOTE — Progress Notes (Signed)
STROKE TEAM PROGRESS NOTE   HISTORY Aaron [name] is an 81 y.o. male hx of CAD, MI presenting to the ED with concerns of right sided weakness. LSW 2300 on 5/28. Woke up this morning 12/02/2014 and noted right sided weakness. 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 Symptoms have been slowly improving but he is not back to his baseline at this time. MRI brain imaging reviewed and shows acute infarct in the left MCA territory.   Patient was not administered TPA secondary to delay in arrival. He was admitted for further evaluation and treatment.   SUBJECTIVE (INTERVAL HISTORY) Patient doing well. No complaints. Daughter interested in SNF .   OBJECTIVE Temp:  [97.5 F (36.4 C)-98.5 F (36.9 C)] 97.9 F (36.6 C) (06/01 1042) Pulse Rate:  [76-101] 84 (06/01 1042) Cardiac Rhythm:  [-]  Resp:  [16-20] 16 (06/01 1042) BP: (112-142)/(62-72) 142/68 mmHg (06/01 1042) SpO2:  [90 %-94 %] 90 % (06/01 1042)  No results for input(s): GLUCAP in the last 168 hours.  Recent Labs Lab 12/02/14 1114 12/02/14 1132 12/03/14 1325 12/04/14 0635  NA 137 140 136 140  K 4.3 4.2 4.3 3.7  CL 104 104 106 106  CO2 24  --  24 24  GLUCOSE 98 99 186* 94  BUN 27* 28* 28* 23*  CREATININE 1.68* 1.60* 1.59* 1.42*  CALCIUM 8.3*  --  8.7* 8.7*    Recent Labs Lab 12/02/14 1114  AST 22  ALT 15*  ALKPHOS 97  BILITOT 0.5  PROT 6.1*  ALBUMIN 2.9*    Recent Labs Lab 12/02/14 1114 12/02/14 1132  WBC 7.0  --   NEUTROABS 4.2  --   HGB 14.1 15.3  HCT 41.6 45.0  MCV 92.7  --   PLT 91*  --    No results for input(s): CKTOTAL, CKMB, CKMBINDEX, TROPONINI in the last 168 hours. No results for input(s): LABPROT, INR in the last 72 hours. No results for input(s): COLORURINE, LABSPEC, [place], GLUCOSEU, HGBUR, BILIRUBINUR, KETONESUR, PROTEINUR, UROBILINOGEN, NITRITE, LEUKOCYTESUR in the last 72 hours.  Invalid input(s): APPERANCEUR     Component  Value Date/Time   CHOL 169 12/03/2014 0817   TRIG 59 12/03/2014 0817   TRIG 43 04/06/2009   HDL 41 12/03/2014 0817   CHOLHDL 4.1 12/03/2014 0817   VLDL 12 12/03/2014 0817   LDLCALC 116* 12/03/2014 0817   Lab Results  Component Value Date   HGBA1C 5.8* 12/03/2014   No results found for: LABOPIA, COCAINSCRNUR, LABBENZ, AMPHETMU, THCU, LABBARB   Recent Labs Lab 12/02/14 [place] <5    No results found. Carotid Doppler  There is 1-39% bilateral ICA stenosis. Vertebral artery flow is antegrade.    PHYSICAL EXAM Frail elderly Caucasian male not in distress. . Afebrile. Head is nontraumatic. Neck is supple without bruit.    Cardiac exam no murmur or gallop. Lungs are clear to auscultation. Distal pulses are well felt. Neurological Exam : Awake and alert oriented 3. Diminished attention, registration and recall. Follows two-step commands. No aphasia or apraxia dysarthria. Extraocular movements are full range without nystagmus. Blinks to threat bilaterally. Mild right lower facial asymmetry. Tongue midline. Motor system exam mild right lower and upper extremity drift. Weakness of right grip and intrinsic hand muscles as well as right hip flexors/5. Normal strength on the left. Coordination is slow but accurate. Sensation is intact. Gait shows dragging of the right leg with  mild apraxia  ng.   ASSESSMENT/PLAN [name] is a 81 y.o. male with history of CAD, MI presenting with right sided weakness. He did not receive IV t-PA due to delay in arrival.   Stroke:  Dominant 2 left MCA territory infarcts,  embolic secondary to unknown source  Resultant  Right hemiparesis  MRI  2 L MCA territory infarcts  MRA  No large vessel stenosis  Carotid Doppler  No significant stenosis   2D Echo  pending   LDL 116  HgbA1c 5.8  SCDs for VTE prophylaxis Diet Heart Room service appropriate?: Yes; Fluid consistency:: Thin Diet - low sodium heart healthy  aspirin 81 mg orally every day  prior to admission, now on aspirin 325 mg orally every day  Ongoing aggressive stroke risk factor management  Dr. Leonie Man discussed diagnosis, prognosis,  treatment options and plan of care with daughter. Based on condition prior to admission (memory issues), age and new stroke diagnosis, daughter and MD agree pt is not a good anticoagulation candidate. Therefore, will not pursue further cardioembolic source workup.    Therapy recommendations:  SNF vs CIR  Disposition:  Family prefer SNF. Daughter feels 21 days is ideal before letting him go back home.   Hyperlipidemia  Home meds:  No statin  LDL 116, goal < 70  Intolerant to statins (severe myalgias)  Other Stroke Risk Factors  Advanced age  Former Cigarette smoker, quit smoking years ago   Coronary artery disease - non-STEMI w/ DES placement 2010 followed by Angelena Form  Other Active Problems  Baseline dementia, very functional prior to admission  Acute on chronic systolic and diastolic heart failure  COPD  Thrombocytopenia, chronic, baseline 90  Chronic kidney disease stage 3  Other Pertinent History  Hx colon cancer 2008  Hospital day # 3 DC to  skilled nursing facility today. Discussed with daughter and answered questions. [place] for Pager information      12/05/2014 2:15 PM    To contact Stroke Continuity provider, please refer to [email or web address] After hours, contact General Neurology

## 2014-12-07 ENCOUNTER — Telehealth: Payer: Self-pay | Admitting: *Deleted

## 2014-12-07 NOTE — Telephone Encounter (Signed)
Pt was on tcm list he was d/c 12/05/14 and sent to SNF...Aaron [name]

## 2014-12-09 DIAGNOSIS — I2510 Atherosclerotic heart disease of native coronary artery without angina pectoris: Secondary | ICD-10-CM | POA: Diagnosis not present

## 2014-12-09 DIAGNOSIS — I69959 Hemiplegia and hemiparesis following unspecified cerebrovascular disease affecting unspecified side: Secondary | ICD-10-CM | POA: Diagnosis not present

## 2014-12-09 DIAGNOSIS — I255 Ischemic cardiomyopathy: Secondary | ICD-10-CM | POA: Diagnosis not present

## 2014-12-09 DIAGNOSIS — R509 Fever, unspecified: Secondary | ICD-10-CM | POA: Diagnosis not present

## 2014-12-09 DIAGNOSIS — N183 Chronic kidney disease, stage 3 (moderate): Secondary | ICD-10-CM | POA: Diagnosis not present

## 2014-12-10 ENCOUNTER — Ambulatory Visit: Payer: Medicare Other | Admitting: Podiatry

## 2014-12-13 ENCOUNTER — Ambulatory Visit: Payer: Self-pay | Admitting: Cardiovascular Disease

## 2014-12-25 ENCOUNTER — Telehealth: Payer: Self-pay | Admitting: Internal Medicine

## 2014-12-25 DIAGNOSIS — Z87820 Personal history of traumatic brain injury: Secondary | ICD-10-CM | POA: Diagnosis not present

## 2014-12-25 DIAGNOSIS — G4710 Hypersomnia, unspecified: Secondary | ICD-10-CM | POA: Diagnosis not present

## 2014-12-25 DIAGNOSIS — N183 Chronic kidney disease, stage 3 (moderate): Secondary | ICD-10-CM | POA: Diagnosis not present

## 2014-12-25 DIAGNOSIS — R269 Unspecified abnormalities of gait and mobility: Secondary | ICD-10-CM | POA: Diagnosis not present

## 2014-12-25 DIAGNOSIS — I5040 Unspecified combined systolic (congestive) and diastolic (congestive) heart failure: Secondary | ICD-10-CM | POA: Diagnosis not present

## 2014-12-25 DIAGNOSIS — J449 Chronic obstructive pulmonary disease, unspecified: Secondary | ICD-10-CM | POA: Diagnosis not present

## 2014-12-25 DIAGNOSIS — I639 Cerebral infarction, unspecified: Secondary | ICD-10-CM | POA: Diagnosis not present

## 2014-12-25 DIAGNOSIS — I255 Ischemic cardiomyopathy: Secondary | ICD-10-CM | POA: Diagnosis not present

## 2014-12-25 DIAGNOSIS — Z9181 History of falling: Secondary | ICD-10-CM | POA: Diagnosis not present

## 2014-12-25 DIAGNOSIS — I2510 Atherosclerotic heart disease of native coronary artery without angina pectoris: Secondary | ICD-10-CM | POA: Diagnosis not present

## 2014-12-25 NOTE — Telephone Encounter (Signed)
Janie Morning [phone]  Need Verbal for orders for home PT

## 2014-12-25 NOTE — Telephone Encounter (Signed)
lvm for verbal okay for Home Health PT

## 2014-12-26 ENCOUNTER — Telehealth: Payer: Self-pay | Admitting: Internal Medicine

## 2014-12-26 MED ORDER — BENZONATATE 100 MG PO CAPS: 100.0000 mg | ORAL_CAPSULE | Freq: Three times a day (TID) | ORAL | Status: DC | PRN

## 2014-12-26 NOTE — Telephone Encounter (Signed)
Pt daughter called in and said that he needs a refill on his benzonatate (TESSALON) 100 MG capsule [[id number]] .  Dr at nursing home gave this med to him.  He has COPD and this is helping him breath.  She said if it can just be filled til he sees hop July 5th??    [facility]  [phone]

## 2014-12-26 NOTE — Telephone Encounter (Signed)
erx done

## 2015-01-01 DIAGNOSIS — R269 Unspecified abnormalities of gait and mobility: Secondary | ICD-10-CM | POA: Diagnosis not present

## 2015-01-01 DIAGNOSIS — I5040 Unspecified combined systolic (congestive) and diastolic (congestive) heart failure: Secondary | ICD-10-CM | POA: Diagnosis not present

## 2015-01-01 DIAGNOSIS — Z9181 History of falling: Secondary | ICD-10-CM | POA: Diagnosis not present

## 2015-01-01 DIAGNOSIS — G4710 Hypersomnia, unspecified: Secondary | ICD-10-CM | POA: Diagnosis not present

## 2015-01-01 DIAGNOSIS — I2510 Atherosclerotic heart disease of native coronary artery without angina pectoris: Secondary | ICD-10-CM | POA: Diagnosis not present

## 2015-01-01 DIAGNOSIS — I255 Ischemic cardiomyopathy: Secondary | ICD-10-CM | POA: Diagnosis not present

## 2015-01-01 DIAGNOSIS — N183 Chronic kidney disease, stage 3 (moderate): Secondary | ICD-10-CM | POA: Diagnosis not present

## 2015-01-01 DIAGNOSIS — I639 Cerebral infarction, unspecified: Secondary | ICD-10-CM | POA: Diagnosis not present

## 2015-01-01 DIAGNOSIS — Z87820 Personal history of traumatic brain injury: Secondary | ICD-10-CM | POA: Diagnosis not present

## 2015-01-01 DIAGNOSIS — J449 Chronic obstructive pulmonary disease, unspecified: Secondary | ICD-10-CM | POA: Diagnosis not present

## 2015-01-03 DIAGNOSIS — R269 Unspecified abnormalities of gait and mobility: Secondary | ICD-10-CM | POA: Diagnosis not present

## 2015-01-03 DIAGNOSIS — I639 Cerebral infarction, unspecified: Secondary | ICD-10-CM | POA: Diagnosis not present

## 2015-01-03 DIAGNOSIS — Z87820 Personal history of traumatic brain injury: Secondary | ICD-10-CM | POA: Diagnosis not present

## 2015-01-03 DIAGNOSIS — N183 Chronic kidney disease, stage 3 (moderate): Secondary | ICD-10-CM | POA: Diagnosis not present

## 2015-01-03 DIAGNOSIS — J449 Chronic obstructive pulmonary disease, unspecified: Secondary | ICD-10-CM | POA: Diagnosis not present

## 2015-01-03 DIAGNOSIS — I5040 Unspecified combined systolic (congestive) and diastolic (congestive) heart failure: Secondary | ICD-10-CM | POA: Diagnosis not present

## 2015-01-03 DIAGNOSIS — Z9181 History of falling: Secondary | ICD-10-CM | POA: Diagnosis not present

## 2015-01-03 DIAGNOSIS — I2510 Atherosclerotic heart disease of native coronary artery without angina pectoris: Secondary | ICD-10-CM | POA: Diagnosis not present

## 2015-01-03 DIAGNOSIS — G4710 Hypersomnia, unspecified: Secondary | ICD-10-CM | POA: Diagnosis not present

## 2015-01-03 DIAGNOSIS — I255 Ischemic cardiomyopathy: Secondary | ICD-10-CM | POA: Diagnosis not present

## 2015-01-08 ENCOUNTER — Ambulatory Visit (INDEPENDENT_AMBULATORY_CARE_PROVIDER_SITE_OTHER): Payer: Medicare Other | Admitting: Internal Medicine

## 2015-01-08 ENCOUNTER — Encounter: Payer: Self-pay | Admitting: Internal Medicine

## 2015-01-08 VITALS — BP 106/60 | HR 76 | Temp 98.1°F | Resp 18 | Wt 151.0 lb

## 2015-01-08 DIAGNOSIS — N183 Chronic kidney disease, stage 3 (moderate): Secondary | ICD-10-CM | POA: Diagnosis not present

## 2015-01-08 DIAGNOSIS — I255 Ischemic cardiomyopathy: Secondary | ICD-10-CM | POA: Diagnosis not present

## 2015-01-08 DIAGNOSIS — I5040 Unspecified combined systolic (congestive) and diastolic (congestive) heart failure: Secondary | ICD-10-CM | POA: Diagnosis not present

## 2015-01-08 DIAGNOSIS — I639 Cerebral infarction, unspecified: Secondary | ICD-10-CM | POA: Diagnosis not present

## 2015-01-08 DIAGNOSIS — Z9181 History of falling: Secondary | ICD-10-CM | POA: Diagnosis not present

## 2015-01-08 DIAGNOSIS — J449 Chronic obstructive pulmonary disease, unspecified: Secondary | ICD-10-CM | POA: Diagnosis not present

## 2015-01-08 DIAGNOSIS — J432 Centrilobular emphysema: Secondary | ICD-10-CM | POA: Diagnosis not present

## 2015-01-08 DIAGNOSIS — G4710 Hypersomnia, unspecified: Secondary | ICD-10-CM | POA: Diagnosis not present

## 2015-01-08 DIAGNOSIS — R05 Cough: Secondary | ICD-10-CM | POA: Diagnosis not present

## 2015-01-08 DIAGNOSIS — R269 Unspecified abnormalities of gait and mobility: Secondary | ICD-10-CM | POA: Diagnosis not present

## 2015-01-08 DIAGNOSIS — I2510 Atherosclerotic heart disease of native coronary artery without angina pectoris: Secondary | ICD-10-CM | POA: Diagnosis not present

## 2015-01-08 DIAGNOSIS — M6289 Other specified disorders of muscle: Secondary | ICD-10-CM

## 2015-01-08 DIAGNOSIS — R059 Cough, unspecified: Secondary | ICD-10-CM

## 2015-01-08 DIAGNOSIS — Z87820 Personal history of traumatic brain injury: Secondary | ICD-10-CM | POA: Diagnosis not present

## 2015-01-08 DIAGNOSIS — R531 Weakness: Secondary | ICD-10-CM

## 2015-01-08 MED ORDER — BENZONATATE 100 MG PO CAPS: 100.0000 mg | ORAL_CAPSULE | Freq: Three times a day (TID) | ORAL | Status: DC | PRN

## 2015-01-08 NOTE — Patient Instructions (Signed)
Please make the Tessalon as needed only. If cough is persistent and recurrent; he should be reevaluated because of his obstructive lung disease. His inhalers might need to be adjusted.

## 2015-01-08 NOTE — Progress Notes (Signed)
Pre visit review using our clinic review tool, if applicable. No additional management support is needed unless otherwise documented below in the visit note. 

## 2015-01-08 NOTE — Progress Notes (Signed)
   Subjective:    Patient ID: Aaron [name], male    DOB: May 26, 1925, 81 y.o.   MRN: [id number]  HPI   He was hospitalized at [facility] 5/21-12/05/14 with acute cerebral infarctions presenting as right-sided weakness. Hospital records were reviewed.  At the rehabilitation center he was treated for pneumonia 6/6. He has continued Tessalon pearls 3 times a day for cough which is nonproductive. His daughter who is historian denies any significant postnasal drainage or reflux symptoms. The cough is worse when he first goes to bed.  His cough has continued to improve since his return home  6/17.   His daughter states these much improved in reference to the weakness in the extremities on the right. He is receiving physical therapy at home.He is using a walker and she feels is almost back to his baseline.  He did have a mechanical fall 6/30 stumbling into a dresser. There was no sequela. There was no cardiac or neurologic prodrome prior to the fall.    Review of Systems Frontal headache, facial pain , nasal purulence, dental pain, sore throat , otic pain or otic discharge denied. No fever , chills or sweats.  Unexplained weight loss, abdominal pain, significant dyspepsia, dysphagia, melena, rectal bleeding, or persistently small caliber stools are denied.     Objective:   Physical Exam  Pertinent or positive findings include: There is facial asymmetry; ptosis is greater on the right than the left. There is asymmetry of the nasolabial folds. He has complete dentures. Breath sounds are somewhat decreased. Right upper extremity is weaker than the left to grip. He does have clubbing of the nailbeds. He does ambulate with a walker.  General appearance :adequately nourished; in no distress.  Eyes: No conjunctival inflammation or scleral icterus is present.  Oral exam:  Lips and gums are healthy appearing.There is no oropharyngeal erythema or exudate noted.  Heart:  Normal rate and regular rhythm. S1  and S2 normal without gallop, murmur, click, rub or other extra sounds    Lungs:Chest clear to auscultation; no wheezes, rhonchi,rales ,or rubs present.No increased work of breathing.   Abdomen: bowel sounds normal, soft and non-tender without masses, organomegaly or hernias noted.  No guarding or rebound.   Vascular : all pulses equal ; no bruits present.  Skin:Warm & dry.  Intact without suspicious lesions or rashes ; no tenting or jaundice   Lymphatic: No lymphadenopathy is noted about the head, neck, axilla  Neuro: Strength & tone generally decreased        Assessment & Plan:  #1 status post acute infarcts 5/29; clinically he is much improved  #2 cough; there may be a component of rhinitis. It was stressed the Tessalon should be as needed. If the cough is progressive reevaluation is needed.

## 2015-01-10 DIAGNOSIS — J449 Chronic obstructive pulmonary disease, unspecified: Secondary | ICD-10-CM | POA: Diagnosis not present

## 2015-01-10 DIAGNOSIS — G4710 Hypersomnia, unspecified: Secondary | ICD-10-CM | POA: Diagnosis not present

## 2015-01-10 DIAGNOSIS — I639 Cerebral infarction, unspecified: Secondary | ICD-10-CM | POA: Diagnosis not present

## 2015-01-10 DIAGNOSIS — Z9181 History of falling: Secondary | ICD-10-CM | POA: Diagnosis not present

## 2015-01-10 DIAGNOSIS — N183 Chronic kidney disease, stage 3 (moderate): Secondary | ICD-10-CM | POA: Diagnosis not present

## 2015-01-10 DIAGNOSIS — I5040 Unspecified combined systolic (congestive) and diastolic (congestive) heart failure: Secondary | ICD-10-CM | POA: Diagnosis not present

## 2015-01-10 DIAGNOSIS — I255 Ischemic cardiomyopathy: Secondary | ICD-10-CM | POA: Diagnosis not present

## 2015-01-10 DIAGNOSIS — I2510 Atherosclerotic heart disease of native coronary artery without angina pectoris: Secondary | ICD-10-CM | POA: Diagnosis not present

## 2015-01-10 DIAGNOSIS — R269 Unspecified abnormalities of gait and mobility: Secondary | ICD-10-CM | POA: Diagnosis not present

## 2015-01-10 DIAGNOSIS — Z87820 Personal history of traumatic brain injury: Secondary | ICD-10-CM | POA: Diagnosis not present

## 2015-01-15 DIAGNOSIS — Z9181 History of falling: Secondary | ICD-10-CM | POA: Diagnosis not present

## 2015-01-15 DIAGNOSIS — I2510 Atherosclerotic heart disease of native coronary artery without angina pectoris: Secondary | ICD-10-CM | POA: Diagnosis not present

## 2015-01-15 DIAGNOSIS — Z87820 Personal history of traumatic brain injury: Secondary | ICD-10-CM | POA: Diagnosis not present

## 2015-01-15 DIAGNOSIS — I639 Cerebral infarction, unspecified: Secondary | ICD-10-CM | POA: Diagnosis not present

## 2015-01-15 DIAGNOSIS — J449 Chronic obstructive pulmonary disease, unspecified: Secondary | ICD-10-CM | POA: Diagnosis not present

## 2015-01-15 DIAGNOSIS — I5040 Unspecified combined systolic (congestive) and diastolic (congestive) heart failure: Secondary | ICD-10-CM | POA: Diagnosis not present

## 2015-01-15 DIAGNOSIS — I255 Ischemic cardiomyopathy: Secondary | ICD-10-CM | POA: Diagnosis not present

## 2015-01-15 DIAGNOSIS — N183 Chronic kidney disease, stage 3 (moderate): Secondary | ICD-10-CM | POA: Diagnosis not present

## 2015-01-15 DIAGNOSIS — G4710 Hypersomnia, unspecified: Secondary | ICD-10-CM | POA: Diagnosis not present

## 2015-01-15 DIAGNOSIS — R269 Unspecified abnormalities of gait and mobility: Secondary | ICD-10-CM | POA: Diagnosis not present

## 2015-01-16 NOTE — Progress Notes (Signed)
Chief Complaint  Patient presents with  . Cough     History of Present Illness: 81 yo male with history of CAD, ICM, COPD, BPH and HLD here today for cardiac follow up. Patient suffered a non-STEMI in 04/2009. LHC 10/10: proximal LAD 25%, CFX subtotally occluded, proximal-mid RCA 25%. PCI: Xience DES x2 to the CFX. He was not started on a beta blocker prior to discharge because of hypotension. Has not tolerated Lisinopril due to hypotension. He has tolerated low dose Lasix. He was admitted to [facility] 05/20/09 with respiratory distress felt to be secondary to COPD exacerbation as well as CHF exacerbation. He has been on Spiriva per Dr. Elsworth Soho. Statin stopped due to muscle aches. Admitted to [facility] with CVA May 2016. Echo May 2016 with LVEF=35-40%.   He is here today for follow up. He has been doing well. He denies chest pain. LE edema improved on Lasix. He has completely recovered from his stroke.   Primary Care Physician: [name]  Last Lipid Profile:Lipid Panel     Component Value Date/Time   CHOL 169 12/03/2014 0817   TRIG 59 12/03/2014 0817   TRIG 43 04/06/2009   HDL 41 12/03/2014 0817   CHOLHDL 4.1 12/03/2014 0817   VLDL 12 12/03/2014 0817   LDLCALC 116* 12/03/2014 0817    Past Medical History  Diagnosis Date  . CAD (coronary artery disease) 04/08/09    Native vessel s/p overlapoing Xience DES stent mid Circumflex  . Other specified forms of chronic ischemic heart disease   . Acute on chronic systolic heart failure   . COPD (chronic obstructive pulmonary disease) 05/2009  . Hyperlipidemia   . Colon cancer 2008    Colon  . Acute myocardial infarction, unspecified site, episode of care unspecified 10/2008    Past Surgical History  Procedure Laterality Date  . Sigmoid colectomy      Done by Dr. Armandina Gemma  . Tonsillectomy      childhood  . Appendectomy      Current Outpatient Prescriptions  Medication Sig Dispense Refill  . Ascorbic Acid (VITAMIN C) 500 MG  tablet Take 500 mg by mouth daily.     Marland Kitchen aspirin 81 MG tablet Take 81 mg by mouth daily.    . benzonatate (TESSALON) 100 MG capsule Take 1 capsule (100 mg total) by mouth 3 (three) times daily as needed for cough. 20 capsule 0  . Cholecalciferol (VITAMIN D) 2000 UNIT CAPS Take 2,000 Units by mouth daily.     . furosemide (LASIX) 20 MG tablet Take 2 tablets (40 mg total) by mouth daily. Or as directed 60 tablet 11  . potassium chloride SA (K-DUR,KLOR-CON) 20 MEQ tablet Take 1 tablet (20 mEq total) by mouth daily. 30 tablet 11  . Spacer/Aero-Holding Chambers (AEROCHAMBER Z-STAT PLUS CHAMBR) MISC by Does not apply route. Use as directed with MDI    . tiotropium (SPIRIVA HANDIHALER) 18 MCG inhalation capsule Place 1 capsule (18 mcg total) into inhaler and inhale daily. 30 capsule 6  . vitamin B-12 (CYANOCOBALAMIN) 100 MCG tablet Take 100 mcg by mouth daily.      . vitamin E 400 UNIT capsule Take 400 Units by mouth daily.       No current facility-administered medications for this visit.    Allergies  Allergen Reactions  . Statins Other (See Comments)    Severe myalgias    History   Social History  . Marital Status: Married    Spouse Name: N/A  .  Number of Children: N/A  . Years of Education: N/A   Occupational History  . Not on file.   Social History Main Topics  . Smoking status: Former Smoker    Types: Cigarettes  . Smokeless tobacco: Not on file  . Alcohol Use: No  . Drug Use: No  . Sexual Activity: Not on file   Other Topics Concern  . Not on file   Social History Narrative   Married, lives with spouse. Supportive dtr lives nearby. 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 Family history unknown: Yes    Review of Systems:  As stated in the HPI and otherwise negative.   BP 100/58 mmHg  Pulse 71  Ht 5\' 4"  (1.626 m)  Wt 149 lb 12.8 oz (67.949 kg)   BMI 25.70 kg/m2  Physical Examination: General: Well developed, well nourished, NAD HEENT: OP clear, mucus membranes moist SKIN: warm, dry. No rashes. Neuro: No focal deficits Musculoskeletal: Muscle strength 5/5 all ext Psychiatric: Mood and affect normal Neck: No JVD, no carotid bruits, no thyromegaly, no lymphadenopathy. Lungs:Clear bilaterally, no wheezes, rhonci, crackles Cardiovascular: Regular rate and rhythm. No murmurs, gallops or rubs. Abdomen:Soft. Bowel sounds present. Non-tender.  Extremities: 2+ bilateral lower extremity edema.  Echo 12/04/14: Left ventricle: The cavity size was normal. Wall thickness was normal. Systolic function was moderately reduced. The estimated ejection fraction was in the range of 35% to 40%. There is inferior, inferoapical and inferoseptal akinesis, suggestive of prior inferior infarct. Doppler parameters are consistent with abnormal left ventricular relaxation (grade 1 diastolic dysfunction). The E/e&' ratio is between 8-15, suggesting indeterminate LV filling pressure. - Aortic valve: Sclerosis without stenosis. There was mild regurgitation. - Mitral valve: Mildly thickened leaflets . There was mild regurgitation. - Left atrium: The atrium was moderately dilated at 42 ml/m2. - Tricuspid valve: There was mild regurgitation. - Pulmonary arteries: PA peak pressure: 35 mm Hg (S). - Inferior vena cava: The vessel was normal in size. The respirophasic diameter changes were in the normal range (>= 50%), consistent with normal central venous pressure.  Impressions:  - Compared to the prior echo in 2014, there are few changes. There is evidence for prior inferior MI. LV filling pressure is borderline elevated. There is mild pulmonary hypertension, RVSP 35 mmHg.  EKG:  EKG is not ordered today. The ekg ordered today demonstrates   Recent Labs: 12/02/2014: ALT 15*; Hemoglobin 15.3; Platelets 91* 12/04/2014: BUN  23*; Creatinine, Ser 1.42*; Potassium 3.7; Sodium 140   Lipid Panel    Component Value Date/Time   CHOL 169 12/03/2014 0817   TRIG 59 12/03/2014 0817   TRIG 43 04/06/2009   HDL 41 12/03/2014 0817   CHOLHDL 4.1 12/03/2014 0817   VLDL 12 12/03/2014 0817   LDLCALC 116* 12/03/2014 0817     Wt Readings from Last 3 Encounters:  01/17/15 149 lb 12.8 oz (67.949 kg)  01/08/15 151 lb (68.493 kg)  12/02/14 148 lb 9.4 oz (67.4 kg)     Other studies Reviewed: Additional studies/ records that were reviewed today include: . Review of the above records demonstrates:    Assessment and Plan:   1. CAD: No recent chest pains. No changes. He is on an ASA. He is not on a beta blocker due to bradycardia.   2. CARDIOMYOPATHY, ISCHEMIC: Stable. He is not on an Ace-inh secondary to renal insufficiency when Ace-inh  started and hypotension. He is not on a beta blocker secondary to bradycardia. Not felt to be an ICD candidate.   3. Hyperlipidemia: Statin stopped early 2016. Pt does not wish to restart.   4. Chronic systolic CHF: Continue Lasix 40 mg po QDaily. Volume well controlled.   Current medicines are reviewed at length with the patient today.  The patient does not have concerns regarding medicines.  The following changes have been made:  no change  Labs/ tests ordered today include:  No orders of the defined types were placed in this encounter.    Disposition:   FU with me in 6 months  Signed, Lauree Chandler, MD 01/17/2015 4:19 PM    [facility] [place], [place], [place]  [address] Phone: [phone]; Fax: [phone]

## 2015-01-17 ENCOUNTER — Encounter: Payer: Self-pay | Admitting: Cardiovascular Disease

## 2015-01-17 ENCOUNTER — Ambulatory Visit (INDEPENDENT_AMBULATORY_CARE_PROVIDER_SITE_OTHER): Payer: Medicare Other | Admitting: Cardiovascular Disease

## 2015-01-17 VITALS — BP 100/58 | HR 71 | Ht 64.0 in | Wt 149.8 lb

## 2015-01-17 DIAGNOSIS — I255 Ischemic cardiomyopathy: Secondary | ICD-10-CM

## 2015-01-17 DIAGNOSIS — E785 Hyperlipidemia, unspecified: Secondary | ICD-10-CM | POA: Diagnosis not present

## 2015-01-17 DIAGNOSIS — I2510 Atherosclerotic heart disease of native coronary artery without angina pectoris: Secondary | ICD-10-CM | POA: Diagnosis not present

## 2015-01-17 DIAGNOSIS — I5022 Chronic systolic (congestive) heart failure: Secondary | ICD-10-CM | POA: Diagnosis not present

## 2015-01-17 NOTE — Patient Instructions (Signed)

## 2015-02-13 ENCOUNTER — Ambulatory Visit: Payer: Self-pay | Admitting: Cardiology

## 2015-03-26 ENCOUNTER — Ambulatory Visit (INDEPENDENT_AMBULATORY_CARE_PROVIDER_SITE_OTHER): Payer: Medicare Other | Admitting: Podiatry

## 2015-03-26 ENCOUNTER — Encounter: Payer: Self-pay | Admitting: Podiatry

## 2015-03-26 DIAGNOSIS — M79676 Pain in unspecified toe(s): Secondary | ICD-10-CM

## 2015-03-26 DIAGNOSIS — B351 Tinea unguium: Secondary | ICD-10-CM

## 2015-03-26 NOTE — Progress Notes (Signed)
Patient ID: Aaron [name], male   DOB: 09/17/1924, 81 y.o.   MRN: [id number]  Subjective: This patient presents with his son-in-law today and is requesting debridement of painful toenails. Last visit for similar service 09/10/2014. Patient said CVA since that visit  Objective: Patient son-in-law present treatment room Patient nonresponsive Walks with assistance of walker The toenails are brittle, elongated, hypertrophic, discolored and tender to direct palpation 6-10 DP and PT pulses 2/4 bilaterally Atrophic skin without any open skin lesions bilaterally  Assessment: Neglected symptomatic onychomycoses 6-10  Plan: Debridement toenails 10 mechanically and electrically. Small pinpoint bleeding fourth right toe treated with topical antibiotic ointment and Band-Aid. Patient son-in-law advised to remove Band-Aid 1-2 days on the fourth right toe and continue apply topical antibiotic ointment daily until a scab forms  Reappoint 3 months

## 2015-03-26 NOTE — Patient Instructions (Signed)
Removed Band-Aid on fourth right toe 1-2 days. Apply topical antibiotic ointment to the fourth right toe daily and cover with a Band-Aid until a scab forms

## 2015-04-09 ENCOUNTER — Ambulatory Visit: Payer: Medicare Other | Admitting: Internal Medicine

## 2015-04-29 ENCOUNTER — Other Ambulatory Visit: Payer: Self-pay | Admitting: Internal Medicine

## 2015-04-29 NOTE — Telephone Encounter (Signed)
Please advise 

## 2015-04-30 ENCOUNTER — Other Ambulatory Visit: Payer: Self-pay

## 2015-04-30 DIAGNOSIS — R059 Cough, unspecified: Secondary | ICD-10-CM

## 2015-04-30 DIAGNOSIS — R05 Cough: Secondary | ICD-10-CM

## 2015-04-30 MED ORDER — BENZONATATE 100 MG PO CAPS: 100.0000 mg | ORAL_CAPSULE | Freq: Three times a day (TID) | ORAL | Status: DC | PRN

## 2015-04-30 NOTE — Telephone Encounter (Signed)
OK X1 

## 2015-05-02 ENCOUNTER — Ambulatory Visit (INDEPENDENT_AMBULATORY_CARE_PROVIDER_SITE_OTHER): Payer: Medicare Other | Admitting: Internal Medicine

## 2015-05-02 ENCOUNTER — Ambulatory Visit (INDEPENDENT_AMBULATORY_CARE_PROVIDER_SITE_OTHER)
Admission: RE | Admit: 2015-05-02 | Discharge: 2015-05-02 | Disposition: A | Discharge status: Home / Self Care | Payer: Medicare Other | Source: Ambulatory Visit | Attending: Internal Medicine | Admitting: Internal Medicine

## 2015-05-02 ENCOUNTER — Encounter: Payer: Self-pay | Admitting: Internal Medicine

## 2015-05-02 VITALS — BP 102/60 | HR 79 | Temp 98.3°F | Resp 18 | Wt 152.0 lb

## 2015-05-02 DIAGNOSIS — R059 Cough, unspecified: Secondary | ICD-10-CM

## 2015-05-02 DIAGNOSIS — R635 Abnormal weight gain: Secondary | ICD-10-CM

## 2015-05-02 DIAGNOSIS — R05 Cough: Secondary | ICD-10-CM

## 2015-05-02 MED ORDER — BENZONATATE 100 MG PO CAPS: 100.0000 mg | ORAL_CAPSULE | Freq: Three times a day (TID) | ORAL | Status: DC | PRN

## 2015-05-02 NOTE — Progress Notes (Signed)
Pre visit review using our clinic review tool, if applicable. No additional management support is needed unless otherwise documented below in the visit note. 

## 2015-05-02 NOTE — Patient Instructions (Addendum)
A chest xray was ordered and we will call you with the results  Benzoate (tessalon perles) was sent to your pharmacy for the cough  Continue to monitor your weight and if it continues to increase please call   If the cough or cold symptoms worsen please call

## 2015-05-02 NOTE — Progress Notes (Signed)
Subjective:    Patient ID: Aaron [name], male    DOB: Mar 16, 1925, 81 y.o.   MRN: [id number]  HPI His daughter brought him in today because of increased cough and increasing weight.  His daughter states that over the past 4 days he has gained 3 pounds according to his scale at home. She was concerned she may be retaining fluid, which is one of the reasons she brought him in. He never complains of anything, but he has had increased cough over these past 4 days. She denies any increased shortness of breath and she has not noticed any increased swelling in his legs. He is taking his furosemide daily as prescribed. She denies any changes in his diet or increased sodium intake.  Cough, COPD: He takes his Spiriva daily as prescribed. He does not use the as needed inhaler and he does not like it. He does have occasional chronic cough, but it has been increased over the past few days. She denies any shortness of breath. She has her occasional wheezing, but this is not new. He has been experiencing increased nasal congestion. She thought this may just be a cold, but wanted to make sure he was not developing pneumonia. He did experience pneumonia over the summer.  She denies any other concerns and he has no complaints.   Medications and allergies reviewed with patient and updated if appropriate.  Patient Active Problem List   Diagnosis Date Noted  . Right sided weakness   . History of traumatic brain injury 12/02/2014  . CVA (cerebral infarction) 12/02/2014  . CKD (chronic kidney disease), stage III 12/02/2014  . Thrombocytopenia ([place]) 12/02/2014  . Edema   . Colon cancer ([place])   . ECZEMA 05/20/2010  . TOBACCO USE, QUIT 07/03/2009  . [place], PERSISTENT 06/14/2009  . CAD, NATIVE VESSEL 04/29/2009  . Dyslipidemia 04/23/2009  . MI 04/23/2009  . Chronic combined systolic (EF 94%) and diastolic CHF, NYHA class 2 04/23/2009  . COPD (chronic obstructive pulmonary disease) with emphysema  ([place]) 04/23/2009  . BENIGN PROSTATIC HYPERTROPHY, HX OF 04/23/2009    Past Medical History  Diagnosis Date  . CAD (coronary artery disease) 04/08/09    Native vessel s/p overlapoing Xience DES stent mid Circumflex  . Other specified forms of chronic ischemic heart disease   . Acute on chronic systolic heart failure ([place])   . COPD (chronic obstructive pulmonary disease) ([place]) 05/2009  . Hyperlipidemia   . Colon cancer [facility]) 2008    Colon  . Acute myocardial infarction, unspecified site, episode of care unspecified 10/2008    Past Surgical History  Procedure Laterality Date  . Sigmoid colectomy      Done by Dr. Armandina Gemma  . Tonsillectomy      childhood  . Appendectomy      Social History   Social History  . Marital Status: Married    Spouse Name: N/A  . Number of Children: N/A  . Years of Education: N/A   Social History Main Topics  . Smoking status: Former Smoker    Types: Cigarettes  . Smokeless tobacco: None  . Alcohol Use: No  . Drug Use: No  . Sexual Activity: Not Asked   Other Topics Concern  . None   Social History Narrative   Married, lives with spouse. Supportive dtr lives nearby. 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  HENT: Positive for congestion. Negative for ear pain, sinus pressure and sore throat.   Respiratory: Positive for cough and wheezing (occasional, chronic). Negative for shortness of breath.   Cardiovascular: Positive for leg swelling (chronic, no change). Negative for chest pain.  Gastrointestinal: Negative for abdominal pain, diarrhea and constipation.  Neurological: Negative for dizziness, light-headedness and headaches.       Objective:   Filed Vitals:   05/02/15 1037  BP: 102/60  Pulse: 79  Temp: 98.3 F (36.8 C)  Resp: 18   Filed Weights   05/02/15 1037  Weight: 152  lb (68.947 kg)   Body mass index is 26.08 kg/(m^2).   Physical Exam  Constitutional: He appears well-developed and well-nourished. No distress.  HENT:  Head: Normocephalic and atraumatic.  Right Ear: External ear normal.  Left Ear: External ear normal.  Mouth/Throat: Oropharynx is clear and moist.  Eyes: Conjunctivae are normal.  Neck: Neck supple. No JVD present. No tracheal deviation present.  Cardiovascular: Normal rate, regular rhythm and normal heart sounds.   No murmur heard. Pulmonary/Chest: No respiratory distress. He has no wheezes.  Bibasilar dry crackles, overall poor inspiratory effort  Abdominal: Soft. He exhibits no distension. There is no tenderness.  Musculoskeletal: He exhibits edema (1+, nonpitting).  Lymphadenopathy:    He has no cervical adenopathy.  Neurological: He is alert.  Skin: Skin is warm and dry. He is not diaphoretic.          Assessment & Plan:   Weight gain On exam he does not appear to be retaining fluid-there is no increased shortness of breath and there is no increased leg edema The crackles on exam are dry and most likely is atelectasis Continue current dose of Lasix-40 mg daily His daughter will continue to monitor his weight and if it continues to increase she will call. At that point I would consider increasing his Lasix for a couple of days Continue low-sodium diet  Increased cough, nasal congestion Likely URI He does not appear to be having a COPD exacerbation He does have some bibasilar crackles, but they sound dry and given his respiratory efforts when I was listening to him most likely this is atelectasis Given his age, COPD and weight gain due to favor getting a chest x-ray just to confirm that he is not developing a pneumonia-ordered Will hold off on antibiotics for now If chest x-ray is normal continue symptomatic treatment. His daughter will continue to monitor closely and call if his symptoms worsen

## 2015-05-14 ENCOUNTER — Other Ambulatory Visit: Payer: Self-pay | Admitting: Cardiovascular Disease

## 2015-06-01 ENCOUNTER — Other Ambulatory Visit: Payer: Self-pay | Admitting: Internal Medicine

## 2015-06-07 NOTE — Telephone Encounter (Signed)
Pt called to check on this request, pt really need this medication today.

## 2015-07-02 ENCOUNTER — Ambulatory Visit (INDEPENDENT_AMBULATORY_CARE_PROVIDER_SITE_OTHER): Payer: Medicare Other | Admitting: Podiatry

## 2015-07-02 ENCOUNTER — Encounter: Payer: Self-pay | Admitting: Podiatry

## 2015-07-02 DIAGNOSIS — M79676 Pain in unspecified toe(s): Secondary | ICD-10-CM

## 2015-07-02 DIAGNOSIS — B351 Tinea unguium: Secondary | ICD-10-CM | POA: Diagnosis not present

## 2015-07-02 NOTE — Progress Notes (Signed)
Patient ID: Aaron [name], male   DOB: 06-17-1925, 81 y.o.   MRN: [id number]  Subjective: This patient presents for scheduled visit complaining of elongated and thickened toenails and is requesting nail debridement  Objective: Patient's daughter present to treatment room Patient delayed response to questioning The toenails are hypertrophic, elongated, deformed, discolored and tender direct palpation 6-10  Assessment: Symptomatic onychomycoses 6-10  Plan: Debridement toenails 6-10 mechanically and electronically without any bleeding  Reappoint 3 months

## 2015-07-29 ENCOUNTER — Ambulatory Visit (INDEPENDENT_AMBULATORY_CARE_PROVIDER_SITE_OTHER): Payer: Medicare Other

## 2015-07-29 DIAGNOSIS — Z111 Encounter for screening for respiratory tuberculosis: Secondary | ICD-10-CM

## 2015-07-31 ENCOUNTER — Other Ambulatory Visit (INDEPENDENT_AMBULATORY_CARE_PROVIDER_SITE_OTHER): Payer: Medicare Other

## 2015-07-31 ENCOUNTER — Ambulatory Visit: Payer: Medicare Other | Admitting: Internal Medicine

## 2015-07-31 ENCOUNTER — Encounter: Payer: Self-pay | Admitting: Internal Medicine

## 2015-07-31 ENCOUNTER — Ambulatory Visit (INDEPENDENT_AMBULATORY_CARE_PROVIDER_SITE_OTHER): Payer: Medicare Other | Admitting: Internal Medicine

## 2015-07-31 VITALS — BP 120/64 | HR 63 | Temp 98.0°F | Resp 18 | Wt 154.0 lb

## 2015-07-31 DIAGNOSIS — D696 Thrombocytopenia, unspecified: Secondary | ICD-10-CM

## 2015-07-31 DIAGNOSIS — E785 Hyperlipidemia, unspecified: Secondary | ICD-10-CM

## 2015-07-31 DIAGNOSIS — N183 Chronic kidney disease, stage 3 unspecified: Secondary | ICD-10-CM

## 2015-07-31 DIAGNOSIS — I2510 Atherosclerotic heart disease of native coronary artery without angina pectoris: Secondary | ICD-10-CM

## 2015-07-31 DIAGNOSIS — R739 Hyperglycemia, unspecified: Secondary | ICD-10-CM | POA: Insufficient documentation

## 2015-07-31 LAB — COMPREHENSIVE METABOLIC PANEL
ALT: 33 U/L (ref 0–53)
AST: 26 U/L (ref 0–37)
Albumin: 3.1 g/dL — ABNORMAL LOW (ref 3.5–5.2)
Alkaline Phosphatase: 153 U/L — ABNORMAL HIGH (ref 39–117)
BUN: 29 mg/dL — ABNORMAL HIGH (ref 6–23)
CALCIUM: 9.1 mg/dL (ref 8.4–10.5)
CHLORIDE: 108 meq/L (ref 96–112)
CO2: 28 meq/L (ref 19–32)
Creatinine, Ser: 1.57 mg/dL — ABNORMAL HIGH (ref 0.40–1.50)
GFR: 44.52 mL/min — ABNORMAL LOW (ref >60.00)
Glucose, Bld: 118 mg/dL — ABNORMAL HIGH (ref 70–99)
Potassium: 4.1 mEq/L (ref 3.5–5.1)
Sodium: 142 mEq/L (ref 135–145)
Total Bilirubin: 0.5 mg/dL (ref 0.2–1.2)
Total Protein: 6.1 g/dL (ref 6.0–8.3)

## 2015-07-31 LAB — URINALYSIS, ROUTINE W REFLEX MICROSCOPIC
Bilirubin Urine: NEGATIVE
Hgb urine dipstick: NEGATIVE
KETONES UR: NEGATIVE
Leukocytes, UA: NEGATIVE
Nitrite: NEGATIVE
RBC / HPF: NONE SEEN (ref 0–?)
SPECIFIC GRAVITY, URINE: 1.01 (ref 1.000–1.030)
TOTAL PROTEIN, URINE-UPE24: NEGATIVE
UROBILINOGEN UA: 0.2 (ref 0.0–1.0)
Urine Glucose: NEGATIVE
pH: 6 (ref 5.0–8.0)

## 2015-07-31 LAB — CBC WITH DIFFERENTIAL/PLATELET
BASOS PCT: 0.4 % (ref 0.0–3.0)
Basophils Absolute: 0 10*3/uL (ref 0.0–0.1)
Eosinophils Absolute: 0.2 10*3/uL (ref 0.0–0.7)
Eosinophils Relative: 2.7 % (ref 0.0–5.0)
HEMATOCRIT: 44.5 % (ref 39.0–52.0)
Hemoglobin: 14.5 g/dL (ref 13.0–17.0)
LYMPHS PCT: 38.5 % (ref 12.0–46.0)
Lymphs Abs: 2.3 10*3/uL (ref 0.7–4.0)
MCHC: 32.5 g/dL (ref 30.0–36.0)
MCV: 94.1 fl (ref 78.0–100.0)
MONOS PCT: 10.7 % (ref 3.0–12.0)
Monocytes Absolute: 0.6 10*3/uL (ref 0.1–1.0)
NEUTROS ABS: 2.9 10*3/uL (ref 1.4–7.7)
Neutrophils Relative %: 47.7 % (ref 43.0–77.0)
PLATELETS: 100 10*3/uL — AB (ref 150.0–400.0)
RBC: 4.73 Mil/uL (ref 4.22–5.81)
RDW: 13.7 % (ref 11.5–15.5)
WBC: 6 10*3/uL (ref 4.0–10.5)

## 2015-07-31 LAB — HEMOGLOBIN A1C: HEMOGLOBIN A1C: 5.9 % (ref 4.6–6.5)

## 2015-07-31 LAB — TB SKIN TEST
Induration: 0 mm
TB Skin Test: NEGATIVE

## 2015-07-31 NOTE — Progress Notes (Signed)
Pre visit review using our clinic review tool, if applicable. No additional management support is needed unless otherwise documented below in the visit note. 

## 2015-07-31 NOTE — Patient Instructions (Signed)
  We have reviewed your prior records including labs and tests today.  Test(s) ordered today. Your results will be released to [place] (or called to you) after review, usually within 72hours after test completion. If any changes need to be made, you will be notified at that same time.  We will send in the paperwork to [facility].

## 2015-07-31 NOTE — Progress Notes (Signed)
Subjective:    Patient ID: Aaron [name], male    DOB: August 13, 1924, 82 y.o.   MRN: [id number]  HPI He is here today for a visit to assess him for nursing home replacement.  His daughter is here with him and provides the history.  She has no concerns and she feels overall he is stable.  He denies any complaints. The main reason he is moving to the nursing home is because his wife is moving to the assisted living facility/nursing home and him moving there will allow them to stay together.  He would benefit from the assistance as well and currently cannot live alone. He may eventually need to be in the memory care section.   He is very difficulty hearing and has tried several hearing aids in the past that have not worked well. His daughter states that this does limit his ability to be social and interact.  He does have some memory difficulty as well.  His duaghter states he always knows his wife and his family. He sometimes is orientated to place, but not orientated to time.  He walks with a walker and is a fall risk, but generally does well.    Heart disease, history heart attack, dyslipidemia:  He is taking his medication daily as prescribed. He denies any chest pain, palpitations, shortness of breath, headaches and lightheadedness. He does have some chronic leg edema and takes a water pill daily.  Chronic kidney disease: His kidney disease has been stable over the years. He is not taking any NSAIDs.  Medications and allergies reviewed with patient and updated if appropriate.  Patient Active Problem List   Diagnosis Date Noted  . Right sided weakness   . History of traumatic brain injury 12/02/2014  . CVA (cerebral infarction) 12/02/2014  . CKD (chronic kidney disease), stage III 12/02/2014  . Thrombocytopenia ([place]) 12/02/2014  . Edema   . Colon cancer ([place])   . ECZEMA 05/20/2010  . TOBACCO USE, QUIT 07/03/2009  . [place], PERSISTENT 06/14/2009  . CAD, NATIVE VESSEL 04/29/2009    . Dyslipidemia 04/23/2009  . MI 04/23/2009  . Chronic combined systolic (EF [id number]) and diastolic CHF, NYHA class 2 04/23/2009  . COPD (chronic obstructive pulmonary disease) with emphysema ([place]) 04/23/2009  . BENIGN PROSTATIC HYPERTROPHY, HX OF 04/23/2009    Current Outpatient Prescriptions on File Prior to Visit  Medication Sig Dispense Refill  . Ascorbic Acid (VITAMIN C) 500 MG tablet Take 500 mg by mouth daily.     Marland Kitchen aspirin 81 MG tablet Take 81 mg by mouth daily.    . benzonatate (TESSALON) 100 MG capsule TAKE 1 CAPSULE(100 MG) BY MOUTH THREE TIMES DAILY AS NEEDED FOR COUGH 20 capsule 0  . Cholecalciferol (VITAMIN D) 2000 UNIT CAPS Take 2,000 Units by mouth daily.     . furosemide (LASIX) 20 MG tablet Take 2 tablets (40 mg total) by mouth daily. Or as directed 60 tablet 11  . potassium chloride SA (K-DUR,KLOR-CON) 20 MEQ tablet Take 1 tablet (20 mEq total) by mouth daily. 30 tablet 11  . Spacer/Aero-Holding Chambers (AEROCHAMBER Z-STAT PLUS CHAMBR) MISC by Does not apply route. Use as directed with MDI    . tiotropium (SPIRIVA HANDIHALER) 18 MCG inhalation capsule Place 1 capsule (18 mcg total) into inhaler and inhale daily. 30 capsule 6  . vitamin B-12 (CYANOCOBALAMIN) 100 MCG tablet Take 100 mcg by mouth daily.      . vitamin E 400 UNIT capsule Take 400 Units  by mouth daily.       No current facility-administered medications on file prior to visit.    Past Medical History  Diagnosis Date  . CAD (coronary artery disease) 04/08/09    Native vessel s/p overlapoing Xience DES stent mid Circumflex  . Other specified forms of chronic ischemic heart disease   . Acute on chronic systolic heart failure ([place])   . COPD (chronic obstructive pulmonary disease) ([place]) 05/2009  . Hyperlipidemia   . Colon cancer [facility]) 2008    Colon  . Acute myocardial infarction, unspecified site, episode of care unspecified 10/2008    Past Surgical History  Procedure Laterality Date  . Sigmoid colectomy       Done by Dr. Armandina Gemma  . Tonsillectomy      childhood  . Appendectomy      Social History   Social History  . Marital Status: Married    Spouse Name: N/A  . Number of Children: N/A  . Years of Education: N/A   Social History Main Topics  . Smoking status: Former Smoker    Types: Cigarettes  . Smokeless tobacco: None  . Alcohol Use: No  . Drug Use: No  . Sexual Activity: Not Asked   Other Topics Concern  . None   Social History Narrative   Married, lives with spouse. Supportive dtr lives nearby. 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 Family history unknown: Yes    Review of Systems  Constitutional: Negative for fever, chills and appetite change.  Respiratory: Negative for cough, shortness of breath and wheezing.   Cardiovascular: Positive for leg swelling. Negative for chest pain and palpitations.  Gastrointestinal: Negative for nausea and abdominal pain.  Neurological: Negative for dizziness, light-headedness and headaches.  Psychiatric/Behavioral: Negative for dysphoric mood. The patient is not nervous/anxious.        Objective:   Filed Vitals:   07/31/15 0927  BP: 120/64  Pulse: 63  Temp: 98 F (36.7 C)  Resp: 18   Filed Weights   07/31/15 0927  Weight: 154 lb (69.854 kg)   Body mass index is 26.42 kg/(m^2).   Physical Exam Constitutional: Appears well-developed and well-nourished. No distress.  Neck: Neck supple. No tracheal deviation present. No thyromegaly present.  No carotid bruit. No cervical adenopathy.   Cardiovascular: Normal rate, regular rhythm and normal heart sounds.   No murmur heard.  1+ b/l LE edema Pulmonary/Chest: Effort normal and breath sounds normal. No respiratory distress. No wheezes. Psych/Neuro: orientated x 1 (person only),  follows commands     Assessment & Plan:  Coronary artery disease, CHF,  history of heart attack: Stable, controlled Leg edema is stable Asymptomatic-no evidence of coronary ischemia clinically Continue current medications  Chronic kidney disease: Has remained stable No NSAIDs We will monitor  Hyperglycemia: Mild prediabetes Monitor only  We will check routine blood work and urinalysis per request of the assisted living facility. PPD today is negative and he will have a second PPD at the assisted living facility.  Paperwork completed He will have a primary care physician there oversee his health  Follow-up as needed

## 2015-08-01 ENCOUNTER — Telehealth: Payer: Self-pay | Admitting: Internal Medicine

## 2015-08-01 ENCOUNTER — Encounter: Payer: Self-pay | Admitting: Emergency Medicine

## 2015-08-01 NOTE — Telephone Encounter (Signed)
Facility was needing FL2 back today in order for pt to be able to stay tonight at facility. Dr Linna Darner agreed to sign FL2. Faxed to facility.

## 2015-08-01 NOTE — Telephone Encounter (Signed)
Spoke with [employer]. She will be faxing over an FL2. This was not in the packet of information brought from the facility.

## 2015-08-01 NOTE — Telephone Encounter (Signed)
Has received everything they need for patient to move in tomorrow except for FL2.  Please contact in regards.

## 2015-08-05 ENCOUNTER — Telehealth: Payer: Self-pay

## 2015-08-05 NOTE — Telephone Encounter (Signed)
FL2 form completed. Original mailed back to pt per Geni Bers, CMA. Copy sent to scan

## 2015-08-05 NOTE — Telephone Encounter (Signed)
Form also faxed back per Geni Bers, CMA

## 2015-08-06 DIAGNOSIS — I2510 Atherosclerotic heart disease of native coronary artery without angina pectoris: Secondary | ICD-10-CM | POA: Diagnosis not present

## 2015-08-06 DIAGNOSIS — N189 Chronic kidney disease, unspecified: Secondary | ICD-10-CM | POA: Diagnosis not present

## 2015-08-06 DIAGNOSIS — I5042 Chronic combined systolic (congestive) and diastolic (congestive) heart failure: Secondary | ICD-10-CM | POA: Diagnosis not present

## 2015-08-06 DIAGNOSIS — Z8673 Personal history of transient ischemic attack (TIA), and cerebral infarction without residual deficits: Secondary | ICD-10-CM | POA: Diagnosis not present

## 2015-08-06 DIAGNOSIS — D518 Other vitamin B12 deficiency anemias: Secondary | ICD-10-CM | POA: Diagnosis not present

## 2015-08-06 DIAGNOSIS — E559 Vitamin D deficiency, unspecified: Secondary | ICD-10-CM | POA: Diagnosis not present

## 2015-08-06 DIAGNOSIS — I252 Old myocardial infarction: Secondary | ICD-10-CM | POA: Diagnosis not present

## 2015-08-09 DIAGNOSIS — Z87820 Personal history of traumatic brain injury: Secondary | ICD-10-CM | POA: Diagnosis not present

## 2015-08-09 DIAGNOSIS — I69398 Other sequelae of cerebral infarction: Secondary | ICD-10-CM | POA: Diagnosis not present

## 2015-08-09 DIAGNOSIS — H905 Unspecified sensorineural hearing loss: Secondary | ICD-10-CM | POA: Diagnosis not present

## 2015-08-09 DIAGNOSIS — R262 Difficulty in walking, not elsewhere classified: Secondary | ICD-10-CM | POA: Diagnosis not present

## 2015-08-13 DIAGNOSIS — Z87820 Personal history of traumatic brain injury: Secondary | ICD-10-CM | POA: Diagnosis not present

## 2015-08-13 DIAGNOSIS — H905 Unspecified sensorineural hearing loss: Secondary | ICD-10-CM | POA: Diagnosis not present

## 2015-08-13 DIAGNOSIS — R262 Difficulty in walking, not elsewhere classified: Secondary | ICD-10-CM | POA: Diagnosis not present

## 2015-08-13 DIAGNOSIS — I69398 Other sequelae of cerebral infarction: Secondary | ICD-10-CM | POA: Diagnosis not present

## 2015-08-14 DIAGNOSIS — H905 Unspecified sensorineural hearing loss: Secondary | ICD-10-CM | POA: Diagnosis not present

## 2015-08-14 DIAGNOSIS — Z87820 Personal history of traumatic brain injury: Secondary | ICD-10-CM | POA: Diagnosis not present

## 2015-08-14 DIAGNOSIS — R262 Difficulty in walking, not elsewhere classified: Secondary | ICD-10-CM | POA: Diagnosis not present

## 2015-08-14 DIAGNOSIS — I69398 Other sequelae of cerebral infarction: Secondary | ICD-10-CM | POA: Diagnosis not present

## 2015-08-15 DIAGNOSIS — I69398 Other sequelae of cerebral infarction: Secondary | ICD-10-CM | POA: Diagnosis not present

## 2015-08-15 DIAGNOSIS — Z87820 Personal history of traumatic brain injury: Secondary | ICD-10-CM | POA: Diagnosis not present

## 2015-08-15 DIAGNOSIS — R262 Difficulty in walking, not elsewhere classified: Secondary | ICD-10-CM | POA: Diagnosis not present

## 2015-08-15 DIAGNOSIS — H905 Unspecified sensorineural hearing loss: Secondary | ICD-10-CM | POA: Diagnosis not present

## 2015-08-20 DIAGNOSIS — I69398 Other sequelae of cerebral infarction: Secondary | ICD-10-CM | POA: Diagnosis not present

## 2015-08-20 DIAGNOSIS — H905 Unspecified sensorineural hearing loss: Secondary | ICD-10-CM | POA: Diagnosis not present

## 2015-08-20 DIAGNOSIS — R262 Difficulty in walking, not elsewhere classified: Secondary | ICD-10-CM | POA: Diagnosis not present

## 2015-08-20 DIAGNOSIS — Z87820 Personal history of traumatic brain injury: Secondary | ICD-10-CM | POA: Diagnosis not present

## 2015-08-22 DIAGNOSIS — Z87820 Personal history of traumatic brain injury: Secondary | ICD-10-CM | POA: Diagnosis not present

## 2015-08-22 DIAGNOSIS — I69398 Other sequelae of cerebral infarction: Secondary | ICD-10-CM | POA: Diagnosis not present

## 2015-08-22 DIAGNOSIS — H905 Unspecified sensorineural hearing loss: Secondary | ICD-10-CM | POA: Diagnosis not present

## 2015-08-22 DIAGNOSIS — R262 Difficulty in walking, not elsewhere classified: Secondary | ICD-10-CM | POA: Diagnosis not present

## 2015-08-27 DIAGNOSIS — H905 Unspecified sensorineural hearing loss: Secondary | ICD-10-CM | POA: Diagnosis not present

## 2015-08-27 DIAGNOSIS — I69398 Other sequelae of cerebral infarction: Secondary | ICD-10-CM | POA: Diagnosis not present

## 2015-08-27 DIAGNOSIS — R262 Difficulty in walking, not elsewhere classified: Secondary | ICD-10-CM | POA: Diagnosis not present

## 2015-08-27 DIAGNOSIS — Z87820 Personal history of traumatic brain injury: Secondary | ICD-10-CM | POA: Diagnosis not present

## 2015-08-29 DIAGNOSIS — I69398 Other sequelae of cerebral infarction: Secondary | ICD-10-CM | POA: Diagnosis not present

## 2015-08-29 DIAGNOSIS — Z87820 Personal history of traumatic brain injury: Secondary | ICD-10-CM | POA: Diagnosis not present

## 2015-08-29 DIAGNOSIS — R262 Difficulty in walking, not elsewhere classified: Secondary | ICD-10-CM | POA: Diagnosis not present

## 2015-08-29 DIAGNOSIS — H905 Unspecified sensorineural hearing loss: Secondary | ICD-10-CM | POA: Diagnosis not present

## 2015-09-03 DIAGNOSIS — E559 Vitamin D deficiency, unspecified: Secondary | ICD-10-CM | POA: Diagnosis not present

## 2015-09-03 DIAGNOSIS — R739 Hyperglycemia, unspecified: Secondary | ICD-10-CM | POA: Diagnosis not present

## 2015-09-03 DIAGNOSIS — R262 Difficulty in walking, not elsewhere classified: Secondary | ICD-10-CM | POA: Diagnosis not present

## 2015-09-03 DIAGNOSIS — J449 Chronic obstructive pulmonary disease, unspecified: Secondary | ICD-10-CM | POA: Diagnosis not present

## 2015-09-03 DIAGNOSIS — Z87820 Personal history of traumatic brain injury: Secondary | ICD-10-CM | POA: Diagnosis not present

## 2015-09-03 DIAGNOSIS — I5042 Chronic combined systolic (congestive) and diastolic (congestive) heart failure: Secondary | ICD-10-CM | POA: Diagnosis not present

## 2015-09-03 DIAGNOSIS — I69398 Other sequelae of cerebral infarction: Secondary | ICD-10-CM | POA: Diagnosis not present

## 2015-09-03 DIAGNOSIS — Z8673 Personal history of transient ischemic attack (TIA), and cerebral infarction without residual deficits: Secondary | ICD-10-CM | POA: Diagnosis not present

## 2015-09-03 DIAGNOSIS — N189 Chronic kidney disease, unspecified: Secondary | ICD-10-CM | POA: Diagnosis not present

## 2015-09-03 DIAGNOSIS — I252 Old myocardial infarction: Secondary | ICD-10-CM | POA: Diagnosis not present

## 2015-09-03 DIAGNOSIS — F5111 Primary hypersomnia: Secondary | ICD-10-CM | POA: Diagnosis not present

## 2015-09-03 DIAGNOSIS — I5040 Unspecified combined systolic (congestive) and diastolic (congestive) heart failure: Secondary | ICD-10-CM | POA: Diagnosis not present

## 2015-09-03 DIAGNOSIS — C189 Malignant neoplasm of colon, unspecified: Secondary | ICD-10-CM | POA: Diagnosis not present

## 2015-09-03 DIAGNOSIS — D518 Other vitamin B12 deficiency anemias: Secondary | ICD-10-CM | POA: Diagnosis not present

## 2015-09-03 DIAGNOSIS — R400 Somnolence: Secondary | ICD-10-CM | POA: Diagnosis not present

## 2015-09-03 DIAGNOSIS — I2510 Atherosclerotic heart disease of native coronary artery without angina pectoris: Secondary | ICD-10-CM | POA: Diagnosis not present

## 2015-09-03 DIAGNOSIS — M6281 Muscle weakness (generalized): Secondary | ICD-10-CM | POA: Diagnosis not present

## 2015-09-03 DIAGNOSIS — N400 Enlarged prostate without lower urinary tract symptoms: Secondary | ICD-10-CM | POA: Diagnosis not present

## 2015-09-03 DIAGNOSIS — E785 Hyperlipidemia, unspecified: Secondary | ICD-10-CM | POA: Diagnosis not present

## 2015-09-03 DIAGNOSIS — H905 Unspecified sensorineural hearing loss: Secondary | ICD-10-CM | POA: Diagnosis not present

## 2015-09-04 DIAGNOSIS — F330 Major depressive disorder, recurrent, mild: Secondary | ICD-10-CM | POA: Diagnosis not present

## 2015-09-05 DIAGNOSIS — I5040 Unspecified combined systolic (congestive) and diastolic (congestive) heart failure: Secondary | ICD-10-CM | POA: Diagnosis not present

## 2015-09-05 DIAGNOSIS — R739 Hyperglycemia, unspecified: Secondary | ICD-10-CM | POA: Diagnosis not present

## 2015-09-05 DIAGNOSIS — N400 Enlarged prostate without lower urinary tract symptoms: Secondary | ICD-10-CM | POA: Diagnosis not present

## 2015-09-05 DIAGNOSIS — I252 Old myocardial infarction: Secondary | ICD-10-CM | POA: Diagnosis not present

## 2015-09-05 DIAGNOSIS — I2510 Atherosclerotic heart disease of native coronary artery without angina pectoris: Secondary | ICD-10-CM | POA: Diagnosis not present

## 2015-09-05 DIAGNOSIS — M6281 Muscle weakness (generalized): Secondary | ICD-10-CM | POA: Diagnosis not present

## 2015-09-05 DIAGNOSIS — I69398 Other sequelae of cerebral infarction: Secondary | ICD-10-CM | POA: Diagnosis not present

## 2015-09-05 DIAGNOSIS — Z87820 Personal history of traumatic brain injury: Secondary | ICD-10-CM | POA: Diagnosis not present

## 2015-09-05 DIAGNOSIS — R400 Somnolence: Secondary | ICD-10-CM | POA: Diagnosis not present

## 2015-09-05 DIAGNOSIS — J449 Chronic obstructive pulmonary disease, unspecified: Secondary | ICD-10-CM | POA: Diagnosis not present

## 2015-09-05 DIAGNOSIS — H905 Unspecified sensorineural hearing loss: Secondary | ICD-10-CM | POA: Diagnosis not present

## 2015-09-05 DIAGNOSIS — R262 Difficulty in walking, not elsewhere classified: Secondary | ICD-10-CM | POA: Diagnosis not present

## 2015-09-05 DIAGNOSIS — E785 Hyperlipidemia, unspecified: Secondary | ICD-10-CM | POA: Diagnosis not present

## 2015-09-10 DIAGNOSIS — J449 Chronic obstructive pulmonary disease, unspecified: Secondary | ICD-10-CM | POA: Diagnosis not present

## 2015-09-10 DIAGNOSIS — I5042 Chronic combined systolic (congestive) and diastolic (congestive) heart failure: Secondary | ICD-10-CM | POA: Diagnosis not present

## 2015-09-10 DIAGNOSIS — N189 Chronic kidney disease, unspecified: Secondary | ICD-10-CM | POA: Diagnosis not present

## 2015-09-10 DIAGNOSIS — R400 Somnolence: Secondary | ICD-10-CM | POA: Diagnosis not present

## 2015-09-10 DIAGNOSIS — I252 Old myocardial infarction: Secondary | ICD-10-CM | POA: Diagnosis not present

## 2015-09-10 DIAGNOSIS — Z87820 Personal history of traumatic brain injury: Secondary | ICD-10-CM | POA: Diagnosis not present

## 2015-09-10 DIAGNOSIS — H905 Unspecified sensorineural hearing loss: Secondary | ICD-10-CM | POA: Diagnosis not present

## 2015-09-10 DIAGNOSIS — R262 Difficulty in walking, not elsewhere classified: Secondary | ICD-10-CM | POA: Diagnosis not present

## 2015-09-10 DIAGNOSIS — I5040 Unspecified combined systolic (congestive) and diastolic (congestive) heart failure: Secondary | ICD-10-CM | POA: Diagnosis not present

## 2015-09-10 DIAGNOSIS — M6281 Muscle weakness (generalized): Secondary | ICD-10-CM | POA: Diagnosis not present

## 2015-09-10 DIAGNOSIS — R739 Hyperglycemia, unspecified: Secondary | ICD-10-CM | POA: Diagnosis not present

## 2015-09-10 DIAGNOSIS — N400 Enlarged prostate without lower urinary tract symptoms: Secondary | ICD-10-CM | POA: Diagnosis not present

## 2015-09-10 DIAGNOSIS — I2510 Atherosclerotic heart disease of native coronary artery without angina pectoris: Secondary | ICD-10-CM | POA: Diagnosis not present

## 2015-09-10 DIAGNOSIS — R600 Localized edema: Secondary | ICD-10-CM | POA: Diagnosis not present

## 2015-09-10 DIAGNOSIS — I69398 Other sequelae of cerebral infarction: Secondary | ICD-10-CM | POA: Diagnosis not present

## 2015-09-10 DIAGNOSIS — E785 Hyperlipidemia, unspecified: Secondary | ICD-10-CM | POA: Diagnosis not present

## 2015-09-12 DIAGNOSIS — R400 Somnolence: Secondary | ICD-10-CM | POA: Diagnosis not present

## 2015-09-12 DIAGNOSIS — M6281 Muscle weakness (generalized): Secondary | ICD-10-CM | POA: Diagnosis not present

## 2015-09-12 DIAGNOSIS — R739 Hyperglycemia, unspecified: Secondary | ICD-10-CM | POA: Diagnosis not present

## 2015-09-12 DIAGNOSIS — N400 Enlarged prostate without lower urinary tract symptoms: Secondary | ICD-10-CM | POA: Diagnosis not present

## 2015-09-12 DIAGNOSIS — Z87820 Personal history of traumatic brain injury: Secondary | ICD-10-CM | POA: Diagnosis not present

## 2015-09-12 DIAGNOSIS — I252 Old myocardial infarction: Secondary | ICD-10-CM | POA: Diagnosis not present

## 2015-09-12 DIAGNOSIS — J449 Chronic obstructive pulmonary disease, unspecified: Secondary | ICD-10-CM | POA: Diagnosis not present

## 2015-09-12 DIAGNOSIS — I2510 Atherosclerotic heart disease of native coronary artery without angina pectoris: Secondary | ICD-10-CM | POA: Diagnosis not present

## 2015-09-12 DIAGNOSIS — E785 Hyperlipidemia, unspecified: Secondary | ICD-10-CM | POA: Diagnosis not present

## 2015-09-12 DIAGNOSIS — R262 Difficulty in walking, not elsewhere classified: Secondary | ICD-10-CM | POA: Diagnosis not present

## 2015-09-12 DIAGNOSIS — I5040 Unspecified combined systolic (congestive) and diastolic (congestive) heart failure: Secondary | ICD-10-CM | POA: Diagnosis not present

## 2015-09-12 DIAGNOSIS — I69398 Other sequelae of cerebral infarction: Secondary | ICD-10-CM | POA: Diagnosis not present

## 2015-09-12 DIAGNOSIS — H905 Unspecified sensorineural hearing loss: Secondary | ICD-10-CM | POA: Diagnosis not present

## 2015-09-17 DIAGNOSIS — N400 Enlarged prostate without lower urinary tract symptoms: Secondary | ICD-10-CM | POA: Diagnosis not present

## 2015-09-17 DIAGNOSIS — I5040 Unspecified combined systolic (congestive) and diastolic (congestive) heart failure: Secondary | ICD-10-CM | POA: Diagnosis not present

## 2015-09-17 DIAGNOSIS — R739 Hyperglycemia, unspecified: Secondary | ICD-10-CM | POA: Diagnosis not present

## 2015-09-17 DIAGNOSIS — J449 Chronic obstructive pulmonary disease, unspecified: Secondary | ICD-10-CM | POA: Diagnosis not present

## 2015-09-17 DIAGNOSIS — H905 Unspecified sensorineural hearing loss: Secondary | ICD-10-CM | POA: Diagnosis not present

## 2015-09-17 DIAGNOSIS — R400 Somnolence: Secondary | ICD-10-CM | POA: Diagnosis not present

## 2015-09-17 DIAGNOSIS — M6281 Muscle weakness (generalized): Secondary | ICD-10-CM | POA: Diagnosis not present

## 2015-09-17 DIAGNOSIS — I69398 Other sequelae of cerebral infarction: Secondary | ICD-10-CM | POA: Diagnosis not present

## 2015-09-17 DIAGNOSIS — Z87820 Personal history of traumatic brain injury: Secondary | ICD-10-CM | POA: Diagnosis not present

## 2015-09-17 DIAGNOSIS — I252 Old myocardial infarction: Secondary | ICD-10-CM | POA: Diagnosis not present

## 2015-09-17 DIAGNOSIS — E785 Hyperlipidemia, unspecified: Secondary | ICD-10-CM | POA: Diagnosis not present

## 2015-09-17 DIAGNOSIS — I2510 Atherosclerotic heart disease of native coronary artery without angina pectoris: Secondary | ICD-10-CM | POA: Diagnosis not present

## 2015-09-17 DIAGNOSIS — R262 Difficulty in walking, not elsewhere classified: Secondary | ICD-10-CM | POA: Diagnosis not present

## 2015-09-19 DIAGNOSIS — I2510 Atherosclerotic heart disease of native coronary artery without angina pectoris: Secondary | ICD-10-CM | POA: Diagnosis not present

## 2015-09-19 DIAGNOSIS — I69398 Other sequelae of cerebral infarction: Secondary | ICD-10-CM | POA: Diagnosis not present

## 2015-09-19 DIAGNOSIS — H905 Unspecified sensorineural hearing loss: Secondary | ICD-10-CM | POA: Diagnosis not present

## 2015-09-19 DIAGNOSIS — N400 Enlarged prostate without lower urinary tract symptoms: Secondary | ICD-10-CM | POA: Diagnosis not present

## 2015-09-19 DIAGNOSIS — R262 Difficulty in walking, not elsewhere classified: Secondary | ICD-10-CM | POA: Diagnosis not present

## 2015-09-19 DIAGNOSIS — R400 Somnolence: Secondary | ICD-10-CM | POA: Diagnosis not present

## 2015-09-19 DIAGNOSIS — J449 Chronic obstructive pulmonary disease, unspecified: Secondary | ICD-10-CM | POA: Diagnosis not present

## 2015-09-19 DIAGNOSIS — M6281 Muscle weakness (generalized): Secondary | ICD-10-CM | POA: Diagnosis not present

## 2015-09-19 DIAGNOSIS — R739 Hyperglycemia, unspecified: Secondary | ICD-10-CM | POA: Diagnosis not present

## 2015-09-19 DIAGNOSIS — I5040 Unspecified combined systolic (congestive) and diastolic (congestive) heart failure: Secondary | ICD-10-CM | POA: Diagnosis not present

## 2015-09-19 DIAGNOSIS — I252 Old myocardial infarction: Secondary | ICD-10-CM | POA: Diagnosis not present

## 2015-09-19 DIAGNOSIS — Z87820 Personal history of traumatic brain injury: Secondary | ICD-10-CM | POA: Diagnosis not present

## 2015-09-19 DIAGNOSIS — E785 Hyperlipidemia, unspecified: Secondary | ICD-10-CM | POA: Diagnosis not present

## 2015-09-20 DIAGNOSIS — E039 Hypothyroidism, unspecified: Secondary | ICD-10-CM | POA: Diagnosis not present

## 2015-09-20 DIAGNOSIS — E782 Mixed hyperlipidemia: Secondary | ICD-10-CM | POA: Diagnosis not present

## 2015-09-20 DIAGNOSIS — I10 Essential (primary) hypertension: Secondary | ICD-10-CM | POA: Diagnosis not present

## 2015-09-24 ENCOUNTER — Other Ambulatory Visit: Payer: Self-pay | Admitting: *Deleted

## 2015-09-24 DIAGNOSIS — N400 Enlarged prostate without lower urinary tract symptoms: Secondary | ICD-10-CM | POA: Diagnosis not present

## 2015-09-24 DIAGNOSIS — R739 Hyperglycemia, unspecified: Secondary | ICD-10-CM | POA: Diagnosis not present

## 2015-09-24 DIAGNOSIS — J449 Chronic obstructive pulmonary disease, unspecified: Secondary | ICD-10-CM | POA: Diagnosis not present

## 2015-09-24 DIAGNOSIS — R400 Somnolence: Secondary | ICD-10-CM | POA: Diagnosis not present

## 2015-09-24 DIAGNOSIS — M6281 Muscle weakness (generalized): Secondary | ICD-10-CM | POA: Diagnosis not present

## 2015-09-24 DIAGNOSIS — I5040 Unspecified combined systolic (congestive) and diastolic (congestive) heart failure: Secondary | ICD-10-CM | POA: Diagnosis not present

## 2015-09-24 DIAGNOSIS — I2510 Atherosclerotic heart disease of native coronary artery without angina pectoris: Secondary | ICD-10-CM | POA: Diagnosis not present

## 2015-09-24 DIAGNOSIS — H905 Unspecified sensorineural hearing loss: Secondary | ICD-10-CM | POA: Diagnosis not present

## 2015-09-24 DIAGNOSIS — Z87820 Personal history of traumatic brain injury: Secondary | ICD-10-CM | POA: Diagnosis not present

## 2015-09-24 DIAGNOSIS — E785 Hyperlipidemia, unspecified: Secondary | ICD-10-CM | POA: Diagnosis not present

## 2015-09-24 DIAGNOSIS — I69398 Other sequelae of cerebral infarction: Secondary | ICD-10-CM | POA: Diagnosis not present

## 2015-09-24 DIAGNOSIS — I252 Old myocardial infarction: Secondary | ICD-10-CM | POA: Diagnosis not present

## 2015-09-24 DIAGNOSIS — R262 Difficulty in walking, not elsewhere classified: Secondary | ICD-10-CM | POA: Diagnosis not present

## 2015-09-24 NOTE — Patient Outreach (Signed)
[facility]) Care Management  09/24/2015  Aaron [name] 1924-09-13 [id number]   RN Health Coach  Attempted screening  outreach call to patient.  Patient was unavailable. Phone had been disconnected.  [facility] [phone]

## 2015-09-26 DIAGNOSIS — I5040 Unspecified combined systolic (congestive) and diastolic (congestive) heart failure: Secondary | ICD-10-CM | POA: Diagnosis not present

## 2015-09-26 DIAGNOSIS — R262 Difficulty in walking, not elsewhere classified: Secondary | ICD-10-CM | POA: Diagnosis not present

## 2015-09-26 DIAGNOSIS — M6281 Muscle weakness (generalized): Secondary | ICD-10-CM | POA: Diagnosis not present

## 2015-09-26 DIAGNOSIS — J449 Chronic obstructive pulmonary disease, unspecified: Secondary | ICD-10-CM | POA: Diagnosis not present

## 2015-09-26 DIAGNOSIS — N400 Enlarged prostate without lower urinary tract symptoms: Secondary | ICD-10-CM | POA: Diagnosis not present

## 2015-09-26 DIAGNOSIS — I252 Old myocardial infarction: Secondary | ICD-10-CM | POA: Diagnosis not present

## 2015-09-26 DIAGNOSIS — R739 Hyperglycemia, unspecified: Secondary | ICD-10-CM | POA: Diagnosis not present

## 2015-09-26 DIAGNOSIS — I2510 Atherosclerotic heart disease of native coronary artery without angina pectoris: Secondary | ICD-10-CM | POA: Diagnosis not present

## 2015-09-26 DIAGNOSIS — R400 Somnolence: Secondary | ICD-10-CM | POA: Diagnosis not present

## 2015-09-26 DIAGNOSIS — Z87820 Personal history of traumatic brain injury: Secondary | ICD-10-CM | POA: Diagnosis not present

## 2015-09-26 DIAGNOSIS — I69398 Other sequelae of cerebral infarction: Secondary | ICD-10-CM | POA: Diagnosis not present

## 2015-09-26 DIAGNOSIS — H905 Unspecified sensorineural hearing loss: Secondary | ICD-10-CM | POA: Diagnosis not present

## 2015-09-26 DIAGNOSIS — E785 Hyperlipidemia, unspecified: Secondary | ICD-10-CM | POA: Diagnosis not present

## 2015-10-01 ENCOUNTER — Ambulatory Visit: Payer: Medicare Other | Admitting: Podiatry

## 2015-10-01 DIAGNOSIS — H905 Unspecified sensorineural hearing loss: Secondary | ICD-10-CM | POA: Diagnosis not present

## 2015-10-01 DIAGNOSIS — N189 Chronic kidney disease, unspecified: Secondary | ICD-10-CM | POA: Diagnosis not present

## 2015-10-01 DIAGNOSIS — R400 Somnolence: Secondary | ICD-10-CM | POA: Diagnosis not present

## 2015-10-01 DIAGNOSIS — R262 Difficulty in walking, not elsewhere classified: Secondary | ICD-10-CM | POA: Diagnosis not present

## 2015-10-01 DIAGNOSIS — Z8673 Personal history of transient ischemic attack (TIA), and cerebral infarction without residual deficits: Secondary | ICD-10-CM | POA: Diagnosis not present

## 2015-10-01 DIAGNOSIS — J449 Chronic obstructive pulmonary disease, unspecified: Secondary | ICD-10-CM | POA: Diagnosis not present

## 2015-10-01 DIAGNOSIS — M6281 Muscle weakness (generalized): Secondary | ICD-10-CM | POA: Diagnosis not present

## 2015-10-01 DIAGNOSIS — E559 Vitamin D deficiency, unspecified: Secondary | ICD-10-CM | POA: Diagnosis not present

## 2015-10-01 DIAGNOSIS — N400 Enlarged prostate without lower urinary tract symptoms: Secondary | ICD-10-CM | POA: Diagnosis not present

## 2015-10-01 DIAGNOSIS — Z87820 Personal history of traumatic brain injury: Secondary | ICD-10-CM | POA: Diagnosis not present

## 2015-10-01 DIAGNOSIS — I69398 Other sequelae of cerebral infarction: Secondary | ICD-10-CM | POA: Diagnosis not present

## 2015-10-01 DIAGNOSIS — D518 Other vitamin B12 deficiency anemias: Secondary | ICD-10-CM | POA: Diagnosis not present

## 2015-10-01 DIAGNOSIS — I5040 Unspecified combined systolic (congestive) and diastolic (congestive) heart failure: Secondary | ICD-10-CM | POA: Diagnosis not present

## 2015-10-01 DIAGNOSIS — I2510 Atherosclerotic heart disease of native coronary artery without angina pectoris: Secondary | ICD-10-CM | POA: Diagnosis not present

## 2015-10-01 DIAGNOSIS — R739 Hyperglycemia, unspecified: Secondary | ICD-10-CM | POA: Diagnosis not present

## 2015-10-01 DIAGNOSIS — E785 Hyperlipidemia, unspecified: Secondary | ICD-10-CM | POA: Diagnosis not present

## 2015-10-01 DIAGNOSIS — I5042 Chronic combined systolic (congestive) and diastolic (congestive) heart failure: Secondary | ICD-10-CM | POA: Diagnosis not present

## 2015-10-01 DIAGNOSIS — I252 Old myocardial infarction: Secondary | ICD-10-CM | POA: Diagnosis not present

## 2015-10-03 DIAGNOSIS — I5040 Unspecified combined systolic (congestive) and diastolic (congestive) heart failure: Secondary | ICD-10-CM | POA: Diagnosis not present

## 2015-10-03 DIAGNOSIS — I252 Old myocardial infarction: Secondary | ICD-10-CM | POA: Diagnosis not present

## 2015-10-03 DIAGNOSIS — C189 Malignant neoplasm of colon, unspecified: Secondary | ICD-10-CM | POA: Diagnosis not present

## 2015-10-03 DIAGNOSIS — R400 Somnolence: Secondary | ICD-10-CM | POA: Diagnosis not present

## 2015-10-03 DIAGNOSIS — I5042 Chronic combined systolic (congestive) and diastolic (congestive) heart failure: Secondary | ICD-10-CM | POA: Diagnosis not present

## 2015-10-03 DIAGNOSIS — I69398 Other sequelae of cerebral infarction: Secondary | ICD-10-CM | POA: Diagnosis not present

## 2015-10-03 DIAGNOSIS — E785 Hyperlipidemia, unspecified: Secondary | ICD-10-CM | POA: Diagnosis not present

## 2015-10-03 DIAGNOSIS — N400 Enlarged prostate without lower urinary tract symptoms: Secondary | ICD-10-CM | POA: Diagnosis not present

## 2015-10-03 DIAGNOSIS — R739 Hyperglycemia, unspecified: Secondary | ICD-10-CM | POA: Diagnosis not present

## 2015-10-03 DIAGNOSIS — J449 Chronic obstructive pulmonary disease, unspecified: Secondary | ICD-10-CM | POA: Diagnosis not present

## 2015-10-03 DIAGNOSIS — R262 Difficulty in walking, not elsewhere classified: Secondary | ICD-10-CM | POA: Diagnosis not present

## 2015-10-03 DIAGNOSIS — M6281 Muscle weakness (generalized): Secondary | ICD-10-CM | POA: Diagnosis not present

## 2015-10-03 DIAGNOSIS — I2510 Atherosclerotic heart disease of native coronary artery without angina pectoris: Secondary | ICD-10-CM | POA: Diagnosis not present

## 2015-10-03 DIAGNOSIS — Z87820 Personal history of traumatic brain injury: Secondary | ICD-10-CM | POA: Diagnosis not present

## 2015-10-03 DIAGNOSIS — H905 Unspecified sensorineural hearing loss: Secondary | ICD-10-CM | POA: Diagnosis not present

## 2015-10-03 DIAGNOSIS — F5111 Primary hypersomnia: Secondary | ICD-10-CM | POA: Diagnosis not present

## 2015-10-04 DIAGNOSIS — E039 Hypothyroidism, unspecified: Secondary | ICD-10-CM | POA: Diagnosis not present

## 2015-10-04 DIAGNOSIS — I10 Essential (primary) hypertension: Secondary | ICD-10-CM | POA: Diagnosis not present

## 2015-10-04 DIAGNOSIS — E782 Mixed hyperlipidemia: Secondary | ICD-10-CM | POA: Diagnosis not present

## 2015-10-09 DIAGNOSIS — F330 Major depressive disorder, recurrent, mild: Secondary | ICD-10-CM | POA: Diagnosis not present

## 2015-10-16 ENCOUNTER — Encounter: Payer: Self-pay | Admitting: *Deleted

## 2015-10-16 ENCOUNTER — Other Ambulatory Visit: Payer: Self-pay | Admitting: *Deleted

## 2015-10-16 DIAGNOSIS — I2510 Atherosclerotic heart disease of native coronary artery without angina pectoris: Secondary | ICD-10-CM | POA: Diagnosis not present

## 2015-10-16 DIAGNOSIS — I5042 Chronic combined systolic (congestive) and diastolic (congestive) heart failure: Secondary | ICD-10-CM | POA: Diagnosis not present

## 2015-10-16 DIAGNOSIS — R600 Localized edema: Secondary | ICD-10-CM | POA: Diagnosis not present

## 2015-10-16 DIAGNOSIS — M2010 Hallux valgus (acquired), unspecified foot: Secondary | ICD-10-CM | POA: Diagnosis not present

## 2015-10-16 DIAGNOSIS — I252 Old myocardial infarction: Secondary | ICD-10-CM | POA: Diagnosis not present

## 2015-10-16 NOTE — Patient Outreach (Signed)
Aaron [name]) Care Management  10/16/2015  Aaron [name] October 02, 1924 [id number]  RN Health Coach telephone call to patient.   Patient main phone has been disconnected. Telephone call to Alric Quan daughter. [employer] compliance verified. Per daughter he is at [name] and they are very satisfied with his care. No needs are identified. Case closed due to consumer assessed and no further intervention needed.  [facility] Management [phone]

## 2015-10-18 DIAGNOSIS — Z66 Do not resuscitate: Secondary | ICD-10-CM | POA: Diagnosis not present

## 2015-10-18 DIAGNOSIS — R509 Fever, unspecified: Secondary | ICD-10-CM | POA: Diagnosis not present

## 2015-10-18 DIAGNOSIS — J949 Pleural condition, unspecified: Secondary | ICD-10-CM | POA: Diagnosis not present

## 2015-10-18 DIAGNOSIS — J9621 Acute and chronic respiratory failure with hypoxia: Secondary | ICD-10-CM | POA: Diagnosis not present

## 2015-10-18 DIAGNOSIS — D696 Thrombocytopenia, unspecified: Secondary | ICD-10-CM | POA: Diagnosis not present

## 2015-10-18 DIAGNOSIS — N179 Acute kidney failure, unspecified: Secondary | ICD-10-CM | POA: Diagnosis not present

## 2015-10-18 DIAGNOSIS — I2510 Atherosclerotic heart disease of native coronary artery without angina pectoris: Secondary | ICD-10-CM | POA: Diagnosis not present

## 2015-10-18 DIAGNOSIS — Z87891 Personal history of nicotine dependence: Secondary | ICD-10-CM | POA: Diagnosis not present

## 2015-10-18 DIAGNOSIS — Z79899 Other long term (current) drug therapy: Secondary | ICD-10-CM | POA: Diagnosis not present

## 2015-10-18 DIAGNOSIS — Z7982 Long term (current) use of aspirin: Secondary | ICD-10-CM | POA: Diagnosis not present

## 2015-10-18 DIAGNOSIS — J441 Chronic obstructive pulmonary disease with (acute) exacerbation: Secondary | ICD-10-CM | POA: Diagnosis not present

## 2015-10-18 DIAGNOSIS — I6782 Cerebral ischemia: Secondary | ICD-10-CM | POA: Diagnosis not present

## 2015-10-18 DIAGNOSIS — R918 Other nonspecific abnormal finding of lung field: Secondary | ICD-10-CM | POA: Diagnosis not present

## 2015-10-18 DIAGNOSIS — J440 Chronic obstructive pulmonary disease with acute lower respiratory infection: Secondary | ICD-10-CM | POA: Diagnosis not present

## 2015-10-18 DIAGNOSIS — Z8673 Personal history of transient ischemic attack (TIA), and cerebral infarction without residual deficits: Secondary | ICD-10-CM | POA: Diagnosis not present

## 2015-10-18 DIAGNOSIS — M1990 Unspecified osteoarthritis, unspecified site: Secondary | ICD-10-CM | POA: Diagnosis not present

## 2015-10-18 DIAGNOSIS — R911 Solitary pulmonary nodule: Secondary | ICD-10-CM | POA: Diagnosis not present

## 2015-10-18 DIAGNOSIS — M6281 Muscle weakness (generalized): Secondary | ICD-10-CM | POA: Diagnosis not present

## 2015-10-18 DIAGNOSIS — N183 Chronic kidney disease, stage 3 (moderate): Secondary | ICD-10-CM | POA: Diagnosis not present

## 2015-10-18 DIAGNOSIS — J90 Pleural effusion, not elsewhere classified: Secondary | ICD-10-CM | POA: Diagnosis not present

## 2015-10-18 DIAGNOSIS — Z85038 Personal history of other malignant neoplasm of large intestine: Secondary | ICD-10-CM | POA: Diagnosis not present

## 2015-10-18 DIAGNOSIS — I252 Old myocardial infarction: Secondary | ICD-10-CM | POA: Diagnosis not present

## 2015-10-18 DIAGNOSIS — J929 Pleural plaque without asbestos: Secondary | ICD-10-CM | POA: Diagnosis not present

## 2015-10-18 DIAGNOSIS — J189 Pneumonia, unspecified organism: Secondary | ICD-10-CM | POA: Diagnosis not present

## 2015-10-18 DIAGNOSIS — G309 Alzheimer's disease, unspecified: Secondary | ICD-10-CM | POA: Diagnosis not present

## 2015-10-18 DIAGNOSIS — J449 Chronic obstructive pulmonary disease, unspecified: Secondary | ICD-10-CM | POA: Diagnosis not present

## 2015-10-18 DIAGNOSIS — J9601 Acute respiratory failure with hypoxia: Secondary | ICD-10-CM | POA: Diagnosis not present

## 2015-10-18 DIAGNOSIS — R0902 Hypoxemia: Secondary | ICD-10-CM | POA: Diagnosis not present

## 2015-10-24 DIAGNOSIS — I823 Embolism and thrombosis of renal vein: Secondary | ICD-10-CM | POA: Diagnosis not present

## 2015-10-24 DIAGNOSIS — I5042 Chronic combined systolic (congestive) and diastolic (congestive) heart failure: Secondary | ICD-10-CM | POA: Diagnosis not present

## 2015-10-24 DIAGNOSIS — R918 Other nonspecific abnormal finding of lung field: Secondary | ICD-10-CM | POA: Diagnosis not present

## 2015-10-24 DIAGNOSIS — J9601 Acute respiratory failure with hypoxia: Secondary | ICD-10-CM | POA: Diagnosis not present

## 2015-10-24 DIAGNOSIS — R509 Fever, unspecified: Secondary | ICD-10-CM | POA: Diagnosis not present

## 2015-10-24 DIAGNOSIS — I252 Old myocardial infarction: Secondary | ICD-10-CM | POA: Diagnosis not present

## 2015-10-24 DIAGNOSIS — M79676 Pain in unspecified toe(s): Secondary | ICD-10-CM | POA: Diagnosis not present

## 2015-10-24 DIAGNOSIS — I10 Essential (primary) hypertension: Secondary | ICD-10-CM | POA: Diagnosis not present

## 2015-10-24 DIAGNOSIS — J189 Pneumonia, unspecified organism: Secondary | ICD-10-CM | POA: Diagnosis not present

## 2015-10-24 DIAGNOSIS — B351 Tinea unguium: Secondary | ICD-10-CM | POA: Diagnosis not present

## 2015-10-24 DIAGNOSIS — J449 Chronic obstructive pulmonary disease, unspecified: Secondary | ICD-10-CM | POA: Diagnosis not present

## 2015-10-24 DIAGNOSIS — F5111 Primary hypersomnia: Secondary | ICD-10-CM | POA: Diagnosis not present

## 2015-10-24 DIAGNOSIS — M6281 Muscle weakness (generalized): Secondary | ICD-10-CM | POA: Diagnosis not present

## 2015-10-24 DIAGNOSIS — C189 Malignant neoplasm of colon, unspecified: Secondary | ICD-10-CM | POA: Diagnosis not present

## 2015-10-24 DIAGNOSIS — J441 Chronic obstructive pulmonary disease with (acute) exacerbation: Secondary | ICD-10-CM | POA: Diagnosis not present

## 2015-10-24 DIAGNOSIS — E785 Hyperlipidemia, unspecified: Secondary | ICD-10-CM | POA: Diagnosis not present

## 2015-10-24 DIAGNOSIS — J9621 Acute and chronic respiratory failure with hypoxia: Secondary | ICD-10-CM | POA: Diagnosis not present

## 2015-10-24 DIAGNOSIS — I2510 Atherosclerotic heart disease of native coronary artery without angina pectoris: Secondary | ICD-10-CM | POA: Diagnosis not present

## 2015-10-24 DIAGNOSIS — J90 Pleural effusion, not elsewhere classified: Secondary | ICD-10-CM | POA: Diagnosis not present

## 2015-10-30 ENCOUNTER — Ambulatory Visit (INDEPENDENT_AMBULATORY_CARE_PROVIDER_SITE_OTHER): Payer: Medicare Other | Admitting: Podiatry

## 2015-10-30 ENCOUNTER — Encounter: Payer: Self-pay | Admitting: Podiatry

## 2015-10-30 DIAGNOSIS — B351 Tinea unguium: Secondary | ICD-10-CM

## 2015-10-30 DIAGNOSIS — M79676 Pain in unspecified toe(s): Secondary | ICD-10-CM | POA: Diagnosis not present

## 2015-10-31 NOTE — Progress Notes (Signed)
Patient ID: NASIEM SIDENER, male   DOB: 07-26-1924, 82 y.o.   MRN: [id number]  Subjective: This patient presents for scheduled visit complaining of elongated and thickened toenails and is requesting nail debridement.Patient's daughter present to treatment room. Patient's daughter stated that her father is living in an assisted living facility [place] and will have future podiatric care provided at the facility  Objective: Patient delayed response to questioning The toenails are hypertrophic, elongated, deformed, discolored and tender direct palpation 6-10  Assessment: Symptomatic onychomycoses 6-10  Plan: Debridement toenails 6-10 mechanically and electronically without any bleeding. No follow-up visit made today

## 2015-11-01 DIAGNOSIS — I5042 Chronic combined systolic (congestive) and diastolic (congestive) heart failure: Secondary | ICD-10-CM | POA: Diagnosis not present

## 2015-11-01 DIAGNOSIS — F5111 Primary hypersomnia: Secondary | ICD-10-CM | POA: Diagnosis not present

## 2015-11-01 DIAGNOSIS — I252 Old myocardial infarction: Secondary | ICD-10-CM | POA: Diagnosis not present

## 2015-11-01 DIAGNOSIS — E785 Hyperlipidemia, unspecified: Secondary | ICD-10-CM | POA: Diagnosis not present

## 2015-11-01 DIAGNOSIS — C189 Malignant neoplasm of colon, unspecified: Secondary | ICD-10-CM | POA: Diagnosis not present

## 2015-11-05 DIAGNOSIS — I5042 Chronic combined systolic (congestive) and diastolic (congestive) heart failure: Secondary | ICD-10-CM | POA: Diagnosis not present

## 2015-11-05 DIAGNOSIS — N189 Chronic kidney disease, unspecified: Secondary | ICD-10-CM | POA: Diagnosis not present

## 2015-11-05 DIAGNOSIS — D518 Other vitamin B12 deficiency anemias: Secondary | ICD-10-CM | POA: Diagnosis not present

## 2015-11-05 DIAGNOSIS — E559 Vitamin D deficiency, unspecified: Secondary | ICD-10-CM | POA: Diagnosis not present

## 2015-11-05 DIAGNOSIS — J189 Pneumonia, unspecified organism: Secondary | ICD-10-CM | POA: Diagnosis not present

## 2015-11-06 DIAGNOSIS — F330 Major depressive disorder, recurrent, mild: Secondary | ICD-10-CM | POA: Diagnosis not present

## 2015-11-19 DIAGNOSIS — G4700 Insomnia, unspecified: Secondary | ICD-10-CM | POA: Diagnosis not present

## 2015-11-19 DIAGNOSIS — N189 Chronic kidney disease, unspecified: Secondary | ICD-10-CM | POA: Diagnosis not present

## 2015-11-19 DIAGNOSIS — I5042 Chronic combined systolic (congestive) and diastolic (congestive) heart failure: Secondary | ICD-10-CM | POA: Diagnosis not present

## 2015-11-26 DIAGNOSIS — E559 Vitamin D deficiency, unspecified: Secondary | ICD-10-CM | POA: Diagnosis not present

## 2015-11-26 DIAGNOSIS — D518 Other vitamin B12 deficiency anemias: Secondary | ICD-10-CM | POA: Diagnosis not present

## 2015-11-26 DIAGNOSIS — I5042 Chronic combined systolic (congestive) and diastolic (congestive) heart failure: Secondary | ICD-10-CM | POA: Diagnosis not present

## 2015-11-26 DIAGNOSIS — Z8673 Personal history of transient ischemic attack (TIA), and cerebral infarction without residual deficits: Secondary | ICD-10-CM | POA: Diagnosis not present

## 2015-11-26 DIAGNOSIS — N189 Chronic kidney disease, unspecified: Secondary | ICD-10-CM | POA: Diagnosis not present

## 2015-12-02 DIAGNOSIS — C189 Malignant neoplasm of colon, unspecified: Secondary | ICD-10-CM | POA: Diagnosis not present

## 2015-12-02 DIAGNOSIS — E785 Hyperlipidemia, unspecified: Secondary | ICD-10-CM | POA: Diagnosis not present

## 2015-12-02 DIAGNOSIS — F5111 Primary hypersomnia: Secondary | ICD-10-CM | POA: Diagnosis not present

## 2015-12-02 DIAGNOSIS — I5042 Chronic combined systolic (congestive) and diastolic (congestive) heart failure: Secondary | ICD-10-CM | POA: Diagnosis not present

## 2015-12-02 DIAGNOSIS — I252 Old myocardial infarction: Secondary | ICD-10-CM | POA: Diagnosis not present

## 2015-12-03 DIAGNOSIS — R05 Cough: Secondary | ICD-10-CM | POA: Diagnosis not present

## 2015-12-04 DIAGNOSIS — F330 Major depressive disorder, recurrent, mild: Secondary | ICD-10-CM | POA: Diagnosis not present

## 2015-12-13 DIAGNOSIS — I10 Essential (primary) hypertension: Secondary | ICD-10-CM | POA: Diagnosis not present

## 2015-12-13 DIAGNOSIS — E782 Mixed hyperlipidemia: Secondary | ICD-10-CM | POA: Diagnosis not present

## 2015-12-13 DIAGNOSIS — E039 Hypothyroidism, unspecified: Secondary | ICD-10-CM | POA: Diagnosis not present

## 2015-12-13 DIAGNOSIS — E440 Moderate protein-calorie malnutrition: Secondary | ICD-10-CM | POA: Diagnosis not present

## 2015-12-13 DIAGNOSIS — E559 Vitamin D deficiency, unspecified: Secondary | ICD-10-CM | POA: Diagnosis not present

## 2015-12-13 DIAGNOSIS — R531 Weakness: Secondary | ICD-10-CM | POA: Diagnosis not present

## 2015-12-24 DIAGNOSIS — Z8673 Personal history of transient ischemic attack (TIA), and cerebral infarction without residual deficits: Secondary | ICD-10-CM | POA: Diagnosis not present

## 2015-12-24 DIAGNOSIS — I5042 Chronic combined systolic (congestive) and diastolic (congestive) heart failure: Secondary | ICD-10-CM | POA: Diagnosis not present

## 2015-12-24 DIAGNOSIS — D518 Other vitamin B12 deficiency anemias: Secondary | ICD-10-CM | POA: Diagnosis not present

## 2015-12-24 DIAGNOSIS — N189 Chronic kidney disease, unspecified: Secondary | ICD-10-CM | POA: Diagnosis not present

## 2015-12-24 DIAGNOSIS — E559 Vitamin D deficiency, unspecified: Secondary | ICD-10-CM | POA: Diagnosis not present

## 2015-12-25 DIAGNOSIS — I6931 Attention and concentration deficit following cerebral infarction: Secondary | ICD-10-CM | POA: Diagnosis not present

## 2016-01-02 DIAGNOSIS — E785 Hyperlipidemia, unspecified: Secondary | ICD-10-CM | POA: Diagnosis not present

## 2016-01-02 DIAGNOSIS — I5042 Chronic combined systolic (congestive) and diastolic (congestive) heart failure: Secondary | ICD-10-CM | POA: Diagnosis not present

## 2016-01-02 DIAGNOSIS — C189 Malignant neoplasm of colon, unspecified: Secondary | ICD-10-CM | POA: Diagnosis not present

## 2016-01-02 DIAGNOSIS — F5111 Primary hypersomnia: Secondary | ICD-10-CM | POA: Diagnosis not present

## 2016-01-02 DIAGNOSIS — I252 Old myocardial infarction: Secondary | ICD-10-CM | POA: Diagnosis not present

## 2016-01-15 DIAGNOSIS — M2010 Hallux valgus (acquired), unspecified foot: Secondary | ICD-10-CM | POA: Diagnosis not present

## 2016-01-15 DIAGNOSIS — R600 Localized edema: Secondary | ICD-10-CM | POA: Diagnosis not present

## 2016-01-15 DIAGNOSIS — B351 Tinea unguium: Secondary | ICD-10-CM | POA: Diagnosis not present

## 2016-01-15 DIAGNOSIS — L603 Nail dystrophy: Secondary | ICD-10-CM | POA: Diagnosis not present

## 2016-01-15 DIAGNOSIS — Q845 Enlarged and hypertrophic nails: Secondary | ICD-10-CM | POA: Diagnosis not present

## 2016-01-21 DIAGNOSIS — I5042 Chronic combined systolic (congestive) and diastolic (congestive) heart failure: Secondary | ICD-10-CM | POA: Diagnosis not present

## 2016-01-21 DIAGNOSIS — D518 Other vitamin B12 deficiency anemias: Secondary | ICD-10-CM | POA: Diagnosis not present

## 2016-01-21 DIAGNOSIS — N189 Chronic kidney disease, unspecified: Secondary | ICD-10-CM | POA: Diagnosis not present

## 2016-01-21 DIAGNOSIS — E559 Vitamin D deficiency, unspecified: Secondary | ICD-10-CM | POA: Diagnosis not present

## 2016-01-21 DIAGNOSIS — H1030 Unspecified acute conjunctivitis, unspecified eye: Secondary | ICD-10-CM | POA: Diagnosis not present

## 2016-01-22 DIAGNOSIS — I6931 Attention and concentration deficit following cerebral infarction: Secondary | ICD-10-CM | POA: Diagnosis not present

## 2016-02-03 DIAGNOSIS — F5111 Primary hypersomnia: Secondary | ICD-10-CM | POA: Diagnosis not present

## 2016-02-03 DIAGNOSIS — E785 Hyperlipidemia, unspecified: Secondary | ICD-10-CM | POA: Diagnosis not present

## 2016-02-03 DIAGNOSIS — I5042 Chronic combined systolic (congestive) and diastolic (congestive) heart failure: Secondary | ICD-10-CM | POA: Diagnosis not present

## 2016-02-03 DIAGNOSIS — I252 Old myocardial infarction: Secondary | ICD-10-CM | POA: Diagnosis not present

## 2016-02-03 DIAGNOSIS — C189 Malignant neoplasm of colon, unspecified: Secondary | ICD-10-CM | POA: Diagnosis not present

## 2016-02-19 DIAGNOSIS — F5101 Primary insomnia: Secondary | ICD-10-CM | POA: Diagnosis not present

## 2016-02-19 DIAGNOSIS — I6931 Attention and concentration deficit following cerebral infarction: Secondary | ICD-10-CM | POA: Diagnosis not present

## 2016-03-03 DIAGNOSIS — I5042 Chronic combined systolic (congestive) and diastolic (congestive) heart failure: Secondary | ICD-10-CM | POA: Diagnosis not present

## 2016-03-03 DIAGNOSIS — I252 Old myocardial infarction: Secondary | ICD-10-CM | POA: Diagnosis not present

## 2016-03-03 DIAGNOSIS — C189 Malignant neoplasm of colon, unspecified: Secondary | ICD-10-CM | POA: Diagnosis not present

## 2016-03-03 DIAGNOSIS — F5111 Primary hypersomnia: Secondary | ICD-10-CM | POA: Diagnosis not present

## 2016-03-03 DIAGNOSIS — E785 Hyperlipidemia, unspecified: Secondary | ICD-10-CM | POA: Diagnosis not present

## 2016-03-11 DIAGNOSIS — F5101 Primary insomnia: Secondary | ICD-10-CM | POA: Diagnosis not present

## 2016-03-11 DIAGNOSIS — I6931 Attention and concentration deficit following cerebral infarction: Secondary | ICD-10-CM | POA: Diagnosis not present

## 2016-03-13 DIAGNOSIS — I10 Essential (primary) hypertension: Secondary | ICD-10-CM | POA: Diagnosis not present

## 2016-03-13 DIAGNOSIS — E531 Pyridoxine deficiency: Secondary | ICD-10-CM | POA: Diagnosis not present

## 2016-03-13 DIAGNOSIS — R531 Weakness: Secondary | ICD-10-CM | POA: Diagnosis not present

## 2016-03-13 DIAGNOSIS — E039 Hypothyroidism, unspecified: Secondary | ICD-10-CM | POA: Diagnosis not present

## 2016-03-17 DIAGNOSIS — N189 Chronic kidney disease, unspecified: Secondary | ICD-10-CM | POA: Diagnosis not present

## 2016-03-17 DIAGNOSIS — I252 Old myocardial infarction: Secondary | ICD-10-CM | POA: Diagnosis not present

## 2016-03-17 DIAGNOSIS — Z8673 Personal history of transient ischemic attack (TIA), and cerebral infarction without residual deficits: Secondary | ICD-10-CM | POA: Diagnosis not present

## 2016-03-17 DIAGNOSIS — I2510 Atherosclerotic heart disease of native coronary artery without angina pectoris: Secondary | ICD-10-CM | POA: Diagnosis not present

## 2016-03-17 DIAGNOSIS — I5042 Chronic combined systolic (congestive) and diastolic (congestive) heart failure: Secondary | ICD-10-CM | POA: Diagnosis not present

## 2016-04-02 DIAGNOSIS — I252 Old myocardial infarction: Secondary | ICD-10-CM | POA: Diagnosis not present

## 2016-04-02 DIAGNOSIS — I5042 Chronic combined systolic (congestive) and diastolic (congestive) heart failure: Secondary | ICD-10-CM | POA: Diagnosis not present

## 2016-04-02 DIAGNOSIS — F5111 Primary hypersomnia: Secondary | ICD-10-CM | POA: Diagnosis not present

## 2016-04-02 DIAGNOSIS — C189 Malignant neoplasm of colon, unspecified: Secondary | ICD-10-CM | POA: Diagnosis not present

## 2016-04-02 DIAGNOSIS — E785 Hyperlipidemia, unspecified: Secondary | ICD-10-CM | POA: Diagnosis not present

## 2016-04-14 DIAGNOSIS — N189 Chronic kidney disease, unspecified: Secondary | ICD-10-CM | POA: Diagnosis not present

## 2016-04-14 DIAGNOSIS — I5042 Chronic combined systolic (congestive) and diastolic (congestive) heart failure: Secondary | ICD-10-CM | POA: Diagnosis not present

## 2016-04-14 DIAGNOSIS — I2510 Atherosclerotic heart disease of native coronary artery without angina pectoris: Secondary | ICD-10-CM | POA: Diagnosis not present

## 2016-04-14 DIAGNOSIS — J069 Acute upper respiratory infection, unspecified: Secondary | ICD-10-CM | POA: Diagnosis not present

## 2016-04-14 DIAGNOSIS — I252 Old myocardial infarction: Secondary | ICD-10-CM | POA: Diagnosis not present

## 2016-05-04 DIAGNOSIS — I252 Old myocardial infarction: Secondary | ICD-10-CM | POA: Diagnosis not present

## 2016-05-04 DIAGNOSIS — E785 Hyperlipidemia, unspecified: Secondary | ICD-10-CM | POA: Diagnosis not present

## 2016-05-04 DIAGNOSIS — F5101 Primary insomnia: Secondary | ICD-10-CM | POA: Diagnosis not present

## 2016-05-04 DIAGNOSIS — I5042 Chronic combined systolic (congestive) and diastolic (congestive) heart failure: Secondary | ICD-10-CM | POA: Diagnosis not present

## 2016-05-04 DIAGNOSIS — F5111 Primary hypersomnia: Secondary | ICD-10-CM | POA: Diagnosis not present

## 2016-05-04 DIAGNOSIS — C189 Malignant neoplasm of colon, unspecified: Secondary | ICD-10-CM | POA: Diagnosis not present

## 2016-05-04 DIAGNOSIS — I6931 Attention and concentration deficit following cerebral infarction: Secondary | ICD-10-CM | POA: Diagnosis not present

## 2016-06-09 DIAGNOSIS — D518 Other vitamin B12 deficiency anemias: Secondary | ICD-10-CM | POA: Diagnosis not present

## 2016-06-09 DIAGNOSIS — Z8673 Personal history of transient ischemic attack (TIA), and cerebral infarction without residual deficits: Secondary | ICD-10-CM | POA: Diagnosis not present

## 2016-06-09 DIAGNOSIS — H1030 Unspecified acute conjunctivitis, unspecified eye: Secondary | ICD-10-CM | POA: Diagnosis not present

## 2016-06-09 DIAGNOSIS — N189 Chronic kidney disease, unspecified: Secondary | ICD-10-CM | POA: Diagnosis not present

## 2016-06-09 DIAGNOSIS — I5042 Chronic combined systolic (congestive) and diastolic (congestive) heart failure: Secondary | ICD-10-CM | POA: Diagnosis not present

## 2016-06-19 DIAGNOSIS — D518 Other vitamin B12 deficiency anemias: Secondary | ICD-10-CM | POA: Diagnosis not present

## 2016-06-19 DIAGNOSIS — E559 Vitamin D deficiency, unspecified: Secondary | ICD-10-CM | POA: Diagnosis not present

## 2016-06-19 DIAGNOSIS — I10 Essential (primary) hypertension: Secondary | ICD-10-CM | POA: Diagnosis not present

## 2016-06-19 DIAGNOSIS — E039 Hypothyroidism, unspecified: Secondary | ICD-10-CM | POA: Diagnosis not present

## 2016-07-03 DIAGNOSIS — F5111 Primary hypersomnia: Secondary | ICD-10-CM | POA: Diagnosis not present

## 2016-07-03 DIAGNOSIS — C189 Malignant neoplasm of colon, unspecified: Secondary | ICD-10-CM | POA: Diagnosis not present

## 2016-07-03 DIAGNOSIS — E785 Hyperlipidemia, unspecified: Secondary | ICD-10-CM | POA: Diagnosis not present

## 2016-07-03 DIAGNOSIS — I252 Old myocardial infarction: Secondary | ICD-10-CM | POA: Diagnosis not present

## 2016-07-03 DIAGNOSIS — I5042 Chronic combined systolic (congestive) and diastolic (congestive) heart failure: Secondary | ICD-10-CM | POA: Diagnosis not present

## 2016-07-07 DIAGNOSIS — Z0000 Encounter for general adult medical examination without abnormal findings: Secondary | ICD-10-CM | POA: Diagnosis not present

## 2016-07-10 DIAGNOSIS — E119 Type 2 diabetes mellitus without complications: Secondary | ICD-10-CM | POA: Diagnosis not present

## 2016-07-26 ENCOUNTER — Encounter: Payer: Self-pay | Admitting: Student

## 2016-07-29 DIAGNOSIS — N189 Chronic kidney disease, unspecified: Secondary | ICD-10-CM | POA: Diagnosis not present

## 2016-07-29 DIAGNOSIS — D518 Other vitamin B12 deficiency anemias: Secondary | ICD-10-CM | POA: Diagnosis not present

## 2016-07-29 DIAGNOSIS — I5042 Chronic combined systolic (congestive) and diastolic (congestive) heart failure: Secondary | ICD-10-CM | POA: Diagnosis not present

## 2016-07-29 DIAGNOSIS — Z8673 Personal history of transient ischemic attack (TIA), and cerebral infarction without residual deficits: Secondary | ICD-10-CM | POA: Diagnosis not present

## 2016-07-29 DIAGNOSIS — H1030 Unspecified acute conjunctivitis, unspecified eye: Secondary | ICD-10-CM | POA: Diagnosis not present

## 2016-08-04 DIAGNOSIS — I5042 Chronic combined systolic (congestive) and diastolic (congestive) heart failure: Secondary | ICD-10-CM | POA: Diagnosis not present

## 2016-08-04 DIAGNOSIS — Z8673 Personal history of transient ischemic attack (TIA), and cerebral infarction without residual deficits: Secondary | ICD-10-CM | POA: Diagnosis not present

## 2016-08-04 DIAGNOSIS — H1030 Unspecified acute conjunctivitis, unspecified eye: Secondary | ICD-10-CM | POA: Diagnosis not present

## 2016-08-04 DIAGNOSIS — I252 Old myocardial infarction: Secondary | ICD-10-CM | POA: Diagnosis not present

## 2016-08-04 DIAGNOSIS — N189 Chronic kidney disease, unspecified: Secondary | ICD-10-CM | POA: Diagnosis not present

## 2016-08-04 DIAGNOSIS — C189 Malignant neoplasm of colon, unspecified: Secondary | ICD-10-CM | POA: Diagnosis not present

## 2016-08-04 DIAGNOSIS — D518 Other vitamin B12 deficiency anemias: Secondary | ICD-10-CM | POA: Diagnosis not present

## 2016-08-04 DIAGNOSIS — F5111 Primary hypersomnia: Secondary | ICD-10-CM | POA: Diagnosis not present

## 2016-08-04 DIAGNOSIS — E785 Hyperlipidemia, unspecified: Secondary | ICD-10-CM | POA: Diagnosis not present

## 2016-08-06 DIAGNOSIS — Q845 Enlarged and hypertrophic nails: Secondary | ICD-10-CM | POA: Diagnosis not present

## 2016-08-06 DIAGNOSIS — M2010 Hallux valgus (acquired), unspecified foot: Secondary | ICD-10-CM | POA: Diagnosis not present

## 2016-08-06 DIAGNOSIS — L603 Nail dystrophy: Secondary | ICD-10-CM | POA: Diagnosis not present

## 2016-08-06 DIAGNOSIS — B351 Tinea unguium: Secondary | ICD-10-CM | POA: Diagnosis not present

## 2016-08-06 DIAGNOSIS — R600 Localized edema: Secondary | ICD-10-CM | POA: Diagnosis not present

## 2016-09-01 DIAGNOSIS — N189 Chronic kidney disease, unspecified: Secondary | ICD-10-CM | POA: Diagnosis not present

## 2016-09-01 DIAGNOSIS — I5042 Chronic combined systolic (congestive) and diastolic (congestive) heart failure: Secondary | ICD-10-CM | POA: Diagnosis not present

## 2016-09-01 DIAGNOSIS — F5111 Primary hypersomnia: Secondary | ICD-10-CM | POA: Diagnosis not present

## 2016-09-01 DIAGNOSIS — H1030 Unspecified acute conjunctivitis, unspecified eye: Secondary | ICD-10-CM | POA: Diagnosis not present

## 2016-09-01 DIAGNOSIS — E785 Hyperlipidemia, unspecified: Secondary | ICD-10-CM | POA: Diagnosis not present

## 2016-09-01 DIAGNOSIS — Z8673 Personal history of transient ischemic attack (TIA), and cerebral infarction without residual deficits: Secondary | ICD-10-CM | POA: Diagnosis not present

## 2016-09-01 DIAGNOSIS — D518 Other vitamin B12 deficiency anemias: Secondary | ICD-10-CM | POA: Diagnosis not present

## 2016-09-01 DIAGNOSIS — C189 Malignant neoplasm of colon, unspecified: Secondary | ICD-10-CM | POA: Diagnosis not present

## 2016-09-01 DIAGNOSIS — I252 Old myocardial infarction: Secondary | ICD-10-CM | POA: Diagnosis not present

## 2016-09-21 DIAGNOSIS — H353131 Nonexudative age-related macular degeneration, bilateral, early dry stage: Secondary | ICD-10-CM | POA: Diagnosis not present

## 2016-09-21 DIAGNOSIS — H2513 Age-related nuclear cataract, bilateral: Secondary | ICD-10-CM | POA: Diagnosis not present

## 2016-09-21 DIAGNOSIS — H35033 Hypertensive retinopathy, bilateral: Secondary | ICD-10-CM | POA: Diagnosis not present

## 2016-09-21 DIAGNOSIS — H18413 Arcus senilis, bilateral: Secondary | ICD-10-CM | POA: Diagnosis not present

## 2016-09-21 DIAGNOSIS — H43813 Vitreous degeneration, bilateral: Secondary | ICD-10-CM | POA: Diagnosis not present

## 2016-10-09 DIAGNOSIS — N183 Chronic kidney disease, stage 3 (moderate): Secondary | ICD-10-CM | POA: Diagnosis not present

## 2016-10-09 DIAGNOSIS — J449 Chronic obstructive pulmonary disease, unspecified: Secondary | ICD-10-CM | POA: Diagnosis not present

## 2016-10-09 DIAGNOSIS — R600 Localized edema: Secondary | ICD-10-CM | POA: Diagnosis not present

## 2016-10-09 DIAGNOSIS — I5042 Chronic combined systolic (congestive) and diastolic (congestive) heart failure: Secondary | ICD-10-CM | POA: Diagnosis not present

## 2016-10-09 DIAGNOSIS — E876 Hypokalemia: Secondary | ICD-10-CM | POA: Diagnosis not present

## 2016-10-23 DIAGNOSIS — I5042 Chronic combined systolic (congestive) and diastolic (congestive) heart failure: Secondary | ICD-10-CM | POA: Diagnosis not present

## 2016-10-23 DIAGNOSIS — E876 Hypokalemia: Secondary | ICD-10-CM | POA: Diagnosis not present

## 2016-10-23 DIAGNOSIS — J449 Chronic obstructive pulmonary disease, unspecified: Secondary | ICD-10-CM | POA: Diagnosis not present

## 2016-10-23 DIAGNOSIS — N189 Chronic kidney disease, unspecified: Secondary | ICD-10-CM | POA: Diagnosis not present

## 2016-10-23 DIAGNOSIS — E119 Type 2 diabetes mellitus without complications: Secondary | ICD-10-CM | POA: Diagnosis not present

## 2016-10-23 DIAGNOSIS — I2510 Atherosclerotic heart disease of native coronary artery without angina pectoris: Secondary | ICD-10-CM | POA: Diagnosis not present

## 2016-10-31 DIAGNOSIS — I5042 Chronic combined systolic (congestive) and diastolic (congestive) heart failure: Secondary | ICD-10-CM | POA: Diagnosis not present

## 2016-10-31 DIAGNOSIS — I252 Old myocardial infarction: Secondary | ICD-10-CM | POA: Diagnosis not present

## 2016-10-31 DIAGNOSIS — C189 Malignant neoplasm of colon, unspecified: Secondary | ICD-10-CM | POA: Diagnosis not present

## 2016-10-31 DIAGNOSIS — F5111 Primary hypersomnia: Secondary | ICD-10-CM | POA: Diagnosis not present

## 2016-10-31 DIAGNOSIS — E785 Hyperlipidemia, unspecified: Secondary | ICD-10-CM | POA: Diagnosis not present

## 2016-11-10 DIAGNOSIS — R600 Localized edema: Secondary | ICD-10-CM | POA: Diagnosis not present

## 2016-11-10 DIAGNOSIS — N183 Chronic kidney disease, stage 3 (moderate): Secondary | ICD-10-CM | POA: Diagnosis not present

## 2016-11-10 DIAGNOSIS — E876 Hypokalemia: Secondary | ICD-10-CM | POA: Diagnosis not present

## 2016-11-10 DIAGNOSIS — J449 Chronic obstructive pulmonary disease, unspecified: Secondary | ICD-10-CM | POA: Diagnosis not present

## 2016-11-10 DIAGNOSIS — I5042 Chronic combined systolic (congestive) and diastolic (congestive) heart failure: Secondary | ICD-10-CM | POA: Diagnosis not present

## 2016-12-08 DIAGNOSIS — E876 Hypokalemia: Secondary | ICD-10-CM | POA: Diagnosis not present

## 2016-12-08 DIAGNOSIS — N183 Chronic kidney disease, stage 3 (moderate): Secondary | ICD-10-CM | POA: Diagnosis not present

## 2016-12-08 DIAGNOSIS — D518 Other vitamin B12 deficiency anemias: Secondary | ICD-10-CM | POA: Diagnosis not present

## 2016-12-08 DIAGNOSIS — J449 Chronic obstructive pulmonary disease, unspecified: Secondary | ICD-10-CM | POA: Diagnosis not present

## 2016-12-08 DIAGNOSIS — I5042 Chronic combined systolic (congestive) and diastolic (congestive) heart failure: Secondary | ICD-10-CM | POA: Diagnosis not present

## 2017-01-05 DIAGNOSIS — I5042 Chronic combined systolic (congestive) and diastolic (congestive) heart failure: Secondary | ICD-10-CM | POA: Diagnosis not present

## 2017-01-05 DIAGNOSIS — D518 Other vitamin B12 deficiency anemias: Secondary | ICD-10-CM | POA: Diagnosis not present

## 2017-01-05 DIAGNOSIS — E876 Hypokalemia: Secondary | ICD-10-CM | POA: Diagnosis not present

## 2017-01-05 DIAGNOSIS — J449 Chronic obstructive pulmonary disease, unspecified: Secondary | ICD-10-CM | POA: Diagnosis not present

## 2017-01-05 DIAGNOSIS — N183 Chronic kidney disease, stage 3 (moderate): Secondary | ICD-10-CM | POA: Diagnosis not present

## 2017-01-12 DIAGNOSIS — B351 Tinea unguium: Secondary | ICD-10-CM | POA: Diagnosis not present

## 2017-01-12 DIAGNOSIS — M79675 Pain in left toe(s): Secondary | ICD-10-CM | POA: Diagnosis not present

## 2017-01-12 DIAGNOSIS — M79674 Pain in right toe(s): Secondary | ICD-10-CM | POA: Diagnosis not present

## 2017-01-15 DIAGNOSIS — E785 Hyperlipidemia, unspecified: Secondary | ICD-10-CM | POA: Diagnosis not present

## 2017-01-15 DIAGNOSIS — E039 Hypothyroidism, unspecified: Secondary | ICD-10-CM | POA: Diagnosis not present

## 2017-01-15 DIAGNOSIS — I10 Essential (primary) hypertension: Secondary | ICD-10-CM | POA: Diagnosis not present

## 2017-01-15 DIAGNOSIS — E559 Vitamin D deficiency, unspecified: Secondary | ICD-10-CM | POA: Diagnosis not present

## 2017-01-15 DIAGNOSIS — E119 Type 2 diabetes mellitus without complications: Secondary | ICD-10-CM | POA: Diagnosis not present

## 2017-01-15 DIAGNOSIS — D510 Vitamin B12 deficiency anemia due to intrinsic factor deficiency: Secondary | ICD-10-CM | POA: Diagnosis not present

## 2017-01-26 DIAGNOSIS — H04123 Dry eye syndrome of bilateral lacrimal glands: Secondary | ICD-10-CM | POA: Diagnosis not present

## 2017-02-01 DIAGNOSIS — F5111 Primary hypersomnia: Secondary | ICD-10-CM | POA: Diagnosis not present

## 2017-02-01 DIAGNOSIS — I252 Old myocardial infarction: Secondary | ICD-10-CM | POA: Diagnosis not present

## 2017-02-01 DIAGNOSIS — I5042 Chronic combined systolic (congestive) and diastolic (congestive) heart failure: Secondary | ICD-10-CM | POA: Diagnosis not present

## 2017-02-01 DIAGNOSIS — D518 Other vitamin B12 deficiency anemias: Secondary | ICD-10-CM | POA: Diagnosis not present

## 2017-02-01 DIAGNOSIS — N183 Chronic kidney disease, stage 3 (moderate): Secondary | ICD-10-CM | POA: Diagnosis not present

## 2017-02-01 DIAGNOSIS — J449 Chronic obstructive pulmonary disease, unspecified: Secondary | ICD-10-CM | POA: Diagnosis not present

## 2017-02-01 DIAGNOSIS — E876 Hypokalemia: Secondary | ICD-10-CM | POA: Diagnosis not present

## 2017-02-01 DIAGNOSIS — E785 Hyperlipidemia, unspecified: Secondary | ICD-10-CM | POA: Diagnosis not present

## 2017-02-01 DIAGNOSIS — C189 Malignant neoplasm of colon, unspecified: Secondary | ICD-10-CM | POA: Diagnosis not present

## 2017-02-09 DIAGNOSIS — N183 Chronic kidney disease, stage 3 (moderate): Secondary | ICD-10-CM | POA: Diagnosis not present

## 2017-02-09 DIAGNOSIS — I5042 Chronic combined systolic (congestive) and diastolic (congestive) heart failure: Secondary | ICD-10-CM | POA: Diagnosis not present

## 2017-02-09 DIAGNOSIS — D518 Other vitamin B12 deficiency anemias: Secondary | ICD-10-CM | POA: Diagnosis not present

## 2017-02-09 DIAGNOSIS — J449 Chronic obstructive pulmonary disease, unspecified: Secondary | ICD-10-CM | POA: Diagnosis not present

## 2017-02-09 DIAGNOSIS — E876 Hypokalemia: Secondary | ICD-10-CM | POA: Diagnosis not present

## 2017-02-11 DIAGNOSIS — K8020 Calculus of gallbladder without cholecystitis without obstruction: Secondary | ICD-10-CM | POA: Diagnosis not present

## 2017-02-11 DIAGNOSIS — Z8673 Personal history of transient ischemic attack (TIA), and cerebral infarction without residual deficits: Secondary | ICD-10-CM | POA: Diagnosis not present

## 2017-02-11 DIAGNOSIS — Z9889 Other specified postprocedural states: Secondary | ICD-10-CM | POA: Diagnosis not present

## 2017-02-11 DIAGNOSIS — Z955 Presence of coronary angioplasty implant and graft: Secondary | ICD-10-CM | POA: Diagnosis not present

## 2017-02-11 DIAGNOSIS — N281 Cyst of kidney, acquired: Secondary | ICD-10-CM | POA: Diagnosis not present

## 2017-02-11 DIAGNOSIS — H6123 Impacted cerumen, bilateral: Secondary | ICD-10-CM | POA: Diagnosis not present

## 2017-02-11 DIAGNOSIS — Z85038 Personal history of other malignant neoplasm of large intestine: Secondary | ICD-10-CM | POA: Diagnosis not present

## 2017-02-11 DIAGNOSIS — Z79899 Other long term (current) drug therapy: Secondary | ICD-10-CM | POA: Diagnosis not present

## 2017-02-11 DIAGNOSIS — M1990 Unspecified osteoarthritis, unspecified site: Secondary | ICD-10-CM | POA: Diagnosis not present

## 2017-02-11 DIAGNOSIS — I77819 Aortic ectasia, unspecified site: Secondary | ICD-10-CM | POA: Diagnosis not present

## 2017-02-11 DIAGNOSIS — J449 Chronic obstructive pulmonary disease, unspecified: Secondary | ICD-10-CM | POA: Diagnosis not present

## 2017-02-11 DIAGNOSIS — M160 Bilateral primary osteoarthritis of hip: Secondary | ICD-10-CM | POA: Diagnosis not present

## 2017-02-11 DIAGNOSIS — R1084 Generalized abdominal pain: Secondary | ICD-10-CM | POA: Diagnosis not present

## 2017-02-11 DIAGNOSIS — Z87891 Personal history of nicotine dependence: Secondary | ICD-10-CM | POA: Diagnosis not present

## 2017-02-11 DIAGNOSIS — M25551 Pain in right hip: Secondary | ICD-10-CM | POA: Diagnosis not present

## 2017-02-11 DIAGNOSIS — I252 Old myocardial infarction: Secondary | ICD-10-CM | POA: Diagnosis not present

## 2017-02-13 DIAGNOSIS — I252 Old myocardial infarction: Secondary | ICD-10-CM | POA: Diagnosis not present

## 2017-02-13 DIAGNOSIS — M25561 Pain in right knee: Secondary | ICD-10-CM | POA: Diagnosis not present

## 2017-02-13 DIAGNOSIS — J449 Chronic obstructive pulmonary disease, unspecified: Secondary | ICD-10-CM | POA: Diagnosis not present

## 2017-02-13 DIAGNOSIS — M1711 Unilateral primary osteoarthritis, right knee: Secondary | ICD-10-CM | POA: Diagnosis not present

## 2017-02-13 DIAGNOSIS — Z7982 Long term (current) use of aspirin: Secondary | ICD-10-CM | POA: Diagnosis not present

## 2017-02-13 DIAGNOSIS — Z79899 Other long term (current) drug therapy: Secondary | ICD-10-CM | POA: Diagnosis not present

## 2017-02-13 DIAGNOSIS — Z955 Presence of coronary angioplasty implant and graft: Secondary | ICD-10-CM | POA: Diagnosis not present

## 2017-02-13 DIAGNOSIS — Z87891 Personal history of nicotine dependence: Secondary | ICD-10-CM | POA: Diagnosis not present

## 2017-02-13 DIAGNOSIS — M7061 Trochanteric bursitis, right hip: Secondary | ICD-10-CM | POA: Diagnosis not present

## 2017-02-23 DIAGNOSIS — H10023 Other mucopurulent conjunctivitis, bilateral: Secondary | ICD-10-CM | POA: Diagnosis not present

## 2017-02-25 DIAGNOSIS — B351 Tinea unguium: Secondary | ICD-10-CM | POA: Diagnosis not present

## 2017-02-25 DIAGNOSIS — I739 Peripheral vascular disease, unspecified: Secondary | ICD-10-CM | POA: Diagnosis not present

## 2017-02-25 DIAGNOSIS — R600 Localized edema: Secondary | ICD-10-CM | POA: Diagnosis not present

## 2017-02-25 DIAGNOSIS — Q845 Enlarged and hypertrophic nails: Secondary | ICD-10-CM | POA: Diagnosis not present

## 2017-02-25 DIAGNOSIS — L603 Nail dystrophy: Secondary | ICD-10-CM | POA: Diagnosis not present

## 2017-03-04 DIAGNOSIS — I5042 Chronic combined systolic (congestive) and diastolic (congestive) heart failure: Secondary | ICD-10-CM | POA: Diagnosis not present

## 2017-03-04 DIAGNOSIS — F5111 Primary hypersomnia: Secondary | ICD-10-CM | POA: Diagnosis not present

## 2017-03-04 DIAGNOSIS — E785 Hyperlipidemia, unspecified: Secondary | ICD-10-CM | POA: Diagnosis not present

## 2017-03-04 DIAGNOSIS — I252 Old myocardial infarction: Secondary | ICD-10-CM | POA: Diagnosis not present

## 2017-03-04 DIAGNOSIS — C189 Malignant neoplasm of colon, unspecified: Secondary | ICD-10-CM | POA: Diagnosis not present

## 2017-03-09 DIAGNOSIS — N183 Chronic kidney disease, stage 3 (moderate): Secondary | ICD-10-CM | POA: Diagnosis not present

## 2017-03-09 DIAGNOSIS — I5042 Chronic combined systolic (congestive) and diastolic (congestive) heart failure: Secondary | ICD-10-CM | POA: Diagnosis not present

## 2017-03-09 DIAGNOSIS — J449 Chronic obstructive pulmonary disease, unspecified: Secondary | ICD-10-CM | POA: Diagnosis not present

## 2017-03-09 DIAGNOSIS — D518 Other vitamin B12 deficiency anemias: Secondary | ICD-10-CM | POA: Diagnosis not present

## 2017-03-09 DIAGNOSIS — E876 Hypokalemia: Secondary | ICD-10-CM | POA: Diagnosis not present

## 2017-04-04 DIAGNOSIS — N183 Chronic kidney disease, stage 3 (moderate): Secondary | ICD-10-CM | POA: Diagnosis not present

## 2017-04-04 DIAGNOSIS — I5042 Chronic combined systolic (congestive) and diastolic (congestive) heart failure: Secondary | ICD-10-CM | POA: Diagnosis not present

## 2017-04-04 DIAGNOSIS — I739 Peripheral vascular disease, unspecified: Secondary | ICD-10-CM | POA: Diagnosis not present

## 2017-04-04 DIAGNOSIS — E119 Type 2 diabetes mellitus without complications: Secondary | ICD-10-CM | POA: Diagnosis not present

## 2017-04-06 DIAGNOSIS — E876 Hypokalemia: Secondary | ICD-10-CM | POA: Diagnosis not present

## 2017-04-06 DIAGNOSIS — I5042 Chronic combined systolic (congestive) and diastolic (congestive) heart failure: Secondary | ICD-10-CM | POA: Diagnosis not present

## 2017-04-06 DIAGNOSIS — N183 Chronic kidney disease, stage 3 (moderate): Secondary | ICD-10-CM | POA: Diagnosis not present

## 2017-04-06 DIAGNOSIS — J449 Chronic obstructive pulmonary disease, unspecified: Secondary | ICD-10-CM | POA: Diagnosis not present

## 2017-04-06 DIAGNOSIS — D518 Other vitamin B12 deficiency anemias: Secondary | ICD-10-CM | POA: Diagnosis not present

## 2017-04-15 DIAGNOSIS — E039 Hypothyroidism, unspecified: Secondary | ICD-10-CM | POA: Diagnosis not present

## 2017-04-15 DIAGNOSIS — E559 Vitamin D deficiency, unspecified: Secondary | ICD-10-CM | POA: Diagnosis not present

## 2017-04-15 DIAGNOSIS — D510 Vitamin B12 deficiency anemia due to intrinsic factor deficiency: Secondary | ICD-10-CM | POA: Diagnosis not present

## 2017-04-15 DIAGNOSIS — E785 Hyperlipidemia, unspecified: Secondary | ICD-10-CM | POA: Diagnosis not present

## 2017-04-15 DIAGNOSIS — E119 Type 2 diabetes mellitus without complications: Secondary | ICD-10-CM | POA: Diagnosis not present

## 2017-04-15 DIAGNOSIS — I10 Essential (primary) hypertension: Secondary | ICD-10-CM | POA: Diagnosis not present

## 2017-05-03 DIAGNOSIS — F5111 Primary hypersomnia: Secondary | ICD-10-CM | POA: Diagnosis not present

## 2017-05-03 DIAGNOSIS — E785 Hyperlipidemia, unspecified: Secondary | ICD-10-CM | POA: Diagnosis not present

## 2017-05-03 DIAGNOSIS — I252 Old myocardial infarction: Secondary | ICD-10-CM | POA: Diagnosis not present

## 2017-05-03 DIAGNOSIS — C189 Malignant neoplasm of colon, unspecified: Secondary | ICD-10-CM | POA: Diagnosis not present

## 2017-05-03 DIAGNOSIS — I5042 Chronic combined systolic (congestive) and diastolic (congestive) heart failure: Secondary | ICD-10-CM | POA: Diagnosis not present

## 2017-05-12 DIAGNOSIS — E876 Hypokalemia: Secondary | ICD-10-CM | POA: Diagnosis not present

## 2017-05-12 DIAGNOSIS — N183 Chronic kidney disease, stage 3 (moderate): Secondary | ICD-10-CM | POA: Diagnosis not present

## 2017-05-12 DIAGNOSIS — D518 Other vitamin B12 deficiency anemias: Secondary | ICD-10-CM | POA: Diagnosis not present

## 2017-05-12 DIAGNOSIS — J449 Chronic obstructive pulmonary disease, unspecified: Secondary | ICD-10-CM | POA: Diagnosis not present

## 2017-05-12 DIAGNOSIS — I5042 Chronic combined systolic (congestive) and diastolic (congestive) heart failure: Secondary | ICD-10-CM | POA: Diagnosis not present

## 2017-05-24 DIAGNOSIS — I5042 Chronic combined systolic (congestive) and diastolic (congestive) heart failure: Secondary | ICD-10-CM | POA: Diagnosis not present

## 2017-05-24 DIAGNOSIS — E876 Hypokalemia: Secondary | ICD-10-CM | POA: Diagnosis not present

## 2017-05-24 DIAGNOSIS — D518 Other vitamin B12 deficiency anemias: Secondary | ICD-10-CM | POA: Diagnosis not present

## 2017-05-24 DIAGNOSIS — J449 Chronic obstructive pulmonary disease, unspecified: Secondary | ICD-10-CM | POA: Diagnosis not present

## 2017-05-24 DIAGNOSIS — N183 Chronic kidney disease, stage 3 (moderate): Secondary | ICD-10-CM | POA: Diagnosis not present

## 2017-05-25 DIAGNOSIS — L603 Nail dystrophy: Secondary | ICD-10-CM | POA: Diagnosis not present

## 2017-05-25 DIAGNOSIS — R600 Localized edema: Secondary | ICD-10-CM | POA: Diagnosis not present

## 2017-05-25 DIAGNOSIS — I739 Peripheral vascular disease, unspecified: Secondary | ICD-10-CM | POA: Diagnosis not present

## 2017-05-25 DIAGNOSIS — Q845 Enlarged and hypertrophic nails: Secondary | ICD-10-CM | POA: Diagnosis not present

## 2017-05-25 DIAGNOSIS — B351 Tinea unguium: Secondary | ICD-10-CM | POA: Diagnosis not present

## 2017-06-02 DIAGNOSIS — H353131 Nonexudative age-related macular degeneration, bilateral, early dry stage: Secondary | ICD-10-CM | POA: Diagnosis not present

## 2017-06-02 DIAGNOSIS — H18413 Arcus senilis, bilateral: Secondary | ICD-10-CM | POA: Diagnosis not present

## 2017-06-02 DIAGNOSIS — H35033 Hypertensive retinopathy, bilateral: Secondary | ICD-10-CM | POA: Diagnosis not present

## 2017-06-02 DIAGNOSIS — H43813 Vitreous degeneration, bilateral: Secondary | ICD-10-CM | POA: Diagnosis not present

## 2017-06-02 DIAGNOSIS — H2513 Age-related nuclear cataract, bilateral: Secondary | ICD-10-CM | POA: Diagnosis not present

## 2017-06-10 DIAGNOSIS — N183 Chronic kidney disease, stage 3 (moderate): Secondary | ICD-10-CM | POA: Diagnosis not present

## 2017-06-10 DIAGNOSIS — E876 Hypokalemia: Secondary | ICD-10-CM | POA: Diagnosis not present

## 2017-06-10 DIAGNOSIS — J449 Chronic obstructive pulmonary disease, unspecified: Secondary | ICD-10-CM | POA: Diagnosis not present

## 2017-06-10 DIAGNOSIS — I5042 Chronic combined systolic (congestive) and diastolic (congestive) heart failure: Secondary | ICD-10-CM | POA: Diagnosis not present

## 2017-06-10 DIAGNOSIS — D518 Other vitamin B12 deficiency anemias: Secondary | ICD-10-CM | POA: Diagnosis not present

## 2017-07-08 DIAGNOSIS — N183 Chronic kidney disease, stage 3 (moderate): Secondary | ICD-10-CM | POA: Diagnosis not present

## 2017-07-08 DIAGNOSIS — D518 Other vitamin B12 deficiency anemias: Secondary | ICD-10-CM | POA: Diagnosis not present

## 2017-07-08 DIAGNOSIS — I5042 Chronic combined systolic (congestive) and diastolic (congestive) heart failure: Secondary | ICD-10-CM | POA: Diagnosis not present

## 2017-07-08 DIAGNOSIS — J449 Chronic obstructive pulmonary disease, unspecified: Secondary | ICD-10-CM | POA: Diagnosis not present

## 2017-07-08 DIAGNOSIS — E876 Hypokalemia: Secondary | ICD-10-CM | POA: Diagnosis not present

## 2017-07-16 DIAGNOSIS — E785 Hyperlipidemia, unspecified: Secondary | ICD-10-CM | POA: Diagnosis not present

## 2017-07-16 DIAGNOSIS — D510 Vitamin B12 deficiency anemia due to intrinsic factor deficiency: Secondary | ICD-10-CM | POA: Diagnosis not present

## 2017-07-16 DIAGNOSIS — E039 Hypothyroidism, unspecified: Secondary | ICD-10-CM | POA: Diagnosis not present

## 2017-07-16 DIAGNOSIS — E119 Type 2 diabetes mellitus without complications: Secondary | ICD-10-CM | POA: Diagnosis not present

## 2017-07-16 DIAGNOSIS — I10 Essential (primary) hypertension: Secondary | ICD-10-CM | POA: Diagnosis not present

## 2017-07-16 DIAGNOSIS — E559 Vitamin D deficiency, unspecified: Secondary | ICD-10-CM | POA: Diagnosis not present

## 2017-08-19 DIAGNOSIS — E876 Hypokalemia: Secondary | ICD-10-CM | POA: Diagnosis not present

## 2017-08-19 DIAGNOSIS — N183 Chronic kidney disease, stage 3 (moderate): Secondary | ICD-10-CM | POA: Diagnosis not present

## 2017-08-19 DIAGNOSIS — J449 Chronic obstructive pulmonary disease, unspecified: Secondary | ICD-10-CM | POA: Diagnosis not present

## 2017-08-19 DIAGNOSIS — I5042 Chronic combined systolic (congestive) and diastolic (congestive) heart failure: Secondary | ICD-10-CM | POA: Diagnosis not present

## 2017-08-19 DIAGNOSIS — D518 Other vitamin B12 deficiency anemias: Secondary | ICD-10-CM | POA: Diagnosis not present

## 2017-09-06 DIAGNOSIS — L603 Nail dystrophy: Secondary | ICD-10-CM | POA: Diagnosis not present

## 2017-09-06 DIAGNOSIS — B351 Tinea unguium: Secondary | ICD-10-CM | POA: Diagnosis not present

## 2017-09-06 DIAGNOSIS — E559 Vitamin D deficiency, unspecified: Secondary | ICD-10-CM | POA: Diagnosis not present

## 2017-09-06 DIAGNOSIS — Q845 Enlarged and hypertrophic nails: Secondary | ICD-10-CM | POA: Diagnosis not present

## 2017-09-09 DIAGNOSIS — E876 Hypokalemia: Secondary | ICD-10-CM | POA: Diagnosis not present

## 2017-09-09 DIAGNOSIS — N183 Chronic kidney disease, stage 3 (moderate): Secondary | ICD-10-CM | POA: Diagnosis not present

## 2017-09-09 DIAGNOSIS — I5042 Chronic combined systolic (congestive) and diastolic (congestive) heart failure: Secondary | ICD-10-CM | POA: Diagnosis not present

## 2017-09-09 DIAGNOSIS — J449 Chronic obstructive pulmonary disease, unspecified: Secondary | ICD-10-CM | POA: Diagnosis not present

## 2017-09-09 DIAGNOSIS — D518 Other vitamin B12 deficiency anemias: Secondary | ICD-10-CM | POA: Diagnosis not present

## 2017-09-15 DIAGNOSIS — E876 Hypokalemia: Secondary | ICD-10-CM | POA: Diagnosis not present

## 2017-09-15 DIAGNOSIS — J449 Chronic obstructive pulmonary disease, unspecified: Secondary | ICD-10-CM | POA: Diagnosis not present

## 2017-09-15 DIAGNOSIS — N183 Chronic kidney disease, stage 3 (moderate): Secondary | ICD-10-CM | POA: Diagnosis not present

## 2017-09-15 DIAGNOSIS — I5042 Chronic combined systolic (congestive) and diastolic (congestive) heart failure: Secondary | ICD-10-CM | POA: Diagnosis not present

## 2017-09-15 DIAGNOSIS — D518 Other vitamin B12 deficiency anemias: Secondary | ICD-10-CM | POA: Diagnosis not present

## 2017-10-07 DIAGNOSIS — N183 Chronic kidney disease, stage 3 (moderate): Secondary | ICD-10-CM | POA: Diagnosis not present

## 2017-10-07 DIAGNOSIS — D518 Other vitamin B12 deficiency anemias: Secondary | ICD-10-CM | POA: Diagnosis not present

## 2017-10-07 DIAGNOSIS — I5042 Chronic combined systolic (congestive) and diastolic (congestive) heart failure: Secondary | ICD-10-CM | POA: Diagnosis not present

## 2017-10-07 DIAGNOSIS — E876 Hypokalemia: Secondary | ICD-10-CM | POA: Diagnosis not present

## 2017-10-07 DIAGNOSIS — J449 Chronic obstructive pulmonary disease, unspecified: Secondary | ICD-10-CM | POA: Diagnosis not present

## 2017-10-28 DIAGNOSIS — J069 Acute upper respiratory infection, unspecified: Secondary | ICD-10-CM | POA: Diagnosis not present

## 2017-11-04 DIAGNOSIS — D518 Other vitamin B12 deficiency anemias: Secondary | ICD-10-CM | POA: Diagnosis not present

## 2017-11-04 DIAGNOSIS — N183 Chronic kidney disease, stage 3 (moderate): Secondary | ICD-10-CM | POA: Diagnosis not present

## 2017-11-04 DIAGNOSIS — I5042 Chronic combined systolic (congestive) and diastolic (congestive) heart failure: Secondary | ICD-10-CM | POA: Diagnosis not present

## 2017-11-04 DIAGNOSIS — E876 Hypokalemia: Secondary | ICD-10-CM | POA: Diagnosis not present

## 2017-11-04 DIAGNOSIS — J449 Chronic obstructive pulmonary disease, unspecified: Secondary | ICD-10-CM | POA: Diagnosis not present

## 2017-11-05 DIAGNOSIS — E785 Hyperlipidemia, unspecified: Secondary | ICD-10-CM | POA: Diagnosis not present

## 2017-11-05 DIAGNOSIS — E119 Type 2 diabetes mellitus without complications: Secondary | ICD-10-CM | POA: Diagnosis not present

## 2017-11-05 DIAGNOSIS — I10 Essential (primary) hypertension: Secondary | ICD-10-CM | POA: Diagnosis not present

## 2017-11-05 DIAGNOSIS — E559 Vitamin D deficiency, unspecified: Secondary | ICD-10-CM | POA: Diagnosis not present

## 2017-11-05 DIAGNOSIS — D510 Vitamin B12 deficiency anemia due to intrinsic factor deficiency: Secondary | ICD-10-CM | POA: Diagnosis not present

## 2017-11-05 DIAGNOSIS — E039 Hypothyroidism, unspecified: Secondary | ICD-10-CM | POA: Diagnosis not present

## 2017-11-10 DIAGNOSIS — L603 Nail dystrophy: Secondary | ICD-10-CM | POA: Diagnosis not present

## 2017-11-10 DIAGNOSIS — Q845 Enlarged and hypertrophic nails: Secondary | ICD-10-CM | POA: Diagnosis not present

## 2017-11-10 DIAGNOSIS — B351 Tinea unguium: Secondary | ICD-10-CM | POA: Diagnosis not present

## 2017-11-10 DIAGNOSIS — I739 Peripheral vascular disease, unspecified: Secondary | ICD-10-CM | POA: Diagnosis not present

## 2017-11-12 DIAGNOSIS — E559 Vitamin D deficiency, unspecified: Secondary | ICD-10-CM | POA: Diagnosis not present

## 2017-11-12 DIAGNOSIS — E785 Hyperlipidemia, unspecified: Secondary | ICD-10-CM | POA: Diagnosis not present

## 2017-11-12 DIAGNOSIS — D510 Vitamin B12 deficiency anemia due to intrinsic factor deficiency: Secondary | ICD-10-CM | POA: Diagnosis not present

## 2017-11-12 DIAGNOSIS — E119 Type 2 diabetes mellitus without complications: Secondary | ICD-10-CM | POA: Diagnosis not present

## 2017-11-12 DIAGNOSIS — I10 Essential (primary) hypertension: Secondary | ICD-10-CM | POA: Diagnosis not present

## 2017-11-12 DIAGNOSIS — E039 Hypothyroidism, unspecified: Secondary | ICD-10-CM | POA: Diagnosis not present

## 2017-11-18 DIAGNOSIS — D510 Vitamin B12 deficiency anemia due to intrinsic factor deficiency: Secondary | ICD-10-CM | POA: Diagnosis not present

## 2017-11-18 DIAGNOSIS — I130 Hypertensive heart and chronic kidney disease with heart failure and stage 1 through stage 4 chronic kidney disease, or unspecified chronic kidney disease: Secondary | ICD-10-CM | POA: Diagnosis not present

## 2017-12-09 DIAGNOSIS — D518 Other vitamin B12 deficiency anemias: Secondary | ICD-10-CM | POA: Diagnosis not present

## 2017-12-09 DIAGNOSIS — N183 Chronic kidney disease, stage 3 (moderate): Secondary | ICD-10-CM | POA: Diagnosis not present

## 2017-12-09 DIAGNOSIS — J449 Chronic obstructive pulmonary disease, unspecified: Secondary | ICD-10-CM | POA: Diagnosis not present

## 2017-12-09 DIAGNOSIS — E876 Hypokalemia: Secondary | ICD-10-CM | POA: Diagnosis not present

## 2017-12-09 DIAGNOSIS — I5042 Chronic combined systolic (congestive) and diastolic (congestive) heart failure: Secondary | ICD-10-CM | POA: Diagnosis not present

## 2017-12-16 DIAGNOSIS — W1830XA Fall on same level, unspecified, initial encounter: Secondary | ICD-10-CM | POA: Diagnosis not present

## 2017-12-16 DIAGNOSIS — S40921A Unspecified superficial injury of right upper arm, initial encounter: Secondary | ICD-10-CM | POA: Diagnosis not present

## 2018-01-06 DIAGNOSIS — N183 Chronic kidney disease, stage 3 (moderate): Secondary | ICD-10-CM | POA: Diagnosis not present

## 2018-01-06 DIAGNOSIS — E876 Hypokalemia: Secondary | ICD-10-CM | POA: Diagnosis not present

## 2018-01-06 DIAGNOSIS — I5042 Chronic combined systolic (congestive) and diastolic (congestive) heart failure: Secondary | ICD-10-CM | POA: Diagnosis not present

## 2018-01-06 DIAGNOSIS — D518 Other vitamin B12 deficiency anemias: Secondary | ICD-10-CM | POA: Diagnosis not present

## 2018-01-06 DIAGNOSIS — J449 Chronic obstructive pulmonary disease, unspecified: Secondary | ICD-10-CM | POA: Diagnosis not present

## 2018-01-13 DIAGNOSIS — D518 Other vitamin B12 deficiency anemias: Secondary | ICD-10-CM | POA: Diagnosis not present

## 2018-01-13 DIAGNOSIS — J449 Chronic obstructive pulmonary disease, unspecified: Secondary | ICD-10-CM | POA: Diagnosis not present

## 2018-01-13 DIAGNOSIS — E876 Hypokalemia: Secondary | ICD-10-CM | POA: Diagnosis not present

## 2018-01-13 DIAGNOSIS — N183 Chronic kidney disease, stage 3 (moderate): Secondary | ICD-10-CM | POA: Diagnosis not present

## 2018-01-13 DIAGNOSIS — I5042 Chronic combined systolic (congestive) and diastolic (congestive) heart failure: Secondary | ICD-10-CM | POA: Diagnosis not present

## 2018-01-28 DIAGNOSIS — F5111 Primary hypersomnia: Secondary | ICD-10-CM | POA: Diagnosis not present

## 2018-01-28 DIAGNOSIS — I252 Old myocardial infarction: Secondary | ICD-10-CM | POA: Diagnosis not present

## 2018-01-28 DIAGNOSIS — C189 Malignant neoplasm of colon, unspecified: Secondary | ICD-10-CM | POA: Diagnosis not present

## 2018-01-28 DIAGNOSIS — E785 Hyperlipidemia, unspecified: Secondary | ICD-10-CM | POA: Diagnosis not present

## 2018-01-28 DIAGNOSIS — I5042 Chronic combined systolic (congestive) and diastolic (congestive) heart failure: Secondary | ICD-10-CM | POA: Diagnosis not present

## 2018-02-10 DIAGNOSIS — I5042 Chronic combined systolic (congestive) and diastolic (congestive) heart failure: Secondary | ICD-10-CM | POA: Diagnosis not present

## 2018-02-10 DIAGNOSIS — E876 Hypokalemia: Secondary | ICD-10-CM | POA: Diagnosis not present

## 2018-02-10 DIAGNOSIS — D518 Other vitamin B12 deficiency anemias: Secondary | ICD-10-CM | POA: Diagnosis not present

## 2018-02-10 DIAGNOSIS — N183 Chronic kidney disease, stage 3 (moderate): Secondary | ICD-10-CM | POA: Diagnosis not present

## 2018-02-10 DIAGNOSIS — J449 Chronic obstructive pulmonary disease, unspecified: Secondary | ICD-10-CM | POA: Diagnosis not present

## 2018-02-28 DIAGNOSIS — N183 Chronic kidney disease, stage 3 (moderate): Secondary | ICD-10-CM | POA: Diagnosis not present

## 2018-02-28 DIAGNOSIS — J984 Other disorders of lung: Secondary | ICD-10-CM | POA: Diagnosis not present

## 2018-02-28 DIAGNOSIS — G309 Alzheimer's disease, unspecified: Secondary | ICD-10-CM | POA: Diagnosis not present

## 2018-02-28 DIAGNOSIS — R0789 Other chest pain: Secondary | ICD-10-CM | POA: Diagnosis not present

## 2018-02-28 DIAGNOSIS — R079 Chest pain, unspecified: Secondary | ICD-10-CM | POA: Diagnosis not present

## 2018-02-28 DIAGNOSIS — Z79899 Other long term (current) drug therapy: Secondary | ICD-10-CM | POA: Diagnosis not present

## 2018-02-28 DIAGNOSIS — Z87891 Personal history of nicotine dependence: Secondary | ICD-10-CM | POA: Diagnosis not present

## 2018-02-28 DIAGNOSIS — I5022 Chronic systolic (congestive) heart failure: Secondary | ICD-10-CM | POA: Diagnosis not present

## 2018-02-28 DIAGNOSIS — I252 Old myocardial infarction: Secondary | ICD-10-CM | POA: Diagnosis not present

## 2018-02-28 DIAGNOSIS — Z85038 Personal history of other malignant neoplasm of large intestine: Secondary | ICD-10-CM | POA: Diagnosis not present

## 2018-02-28 DIAGNOSIS — J9811 Atelectasis: Secondary | ICD-10-CM | POA: Diagnosis not present

## 2018-02-28 DIAGNOSIS — Z8673 Personal history of transient ischemic attack (TIA), and cerebral infarction without residual deficits: Secondary | ICD-10-CM | POA: Diagnosis not present

## 2018-02-28 DIAGNOSIS — I2510 Atherosclerotic heart disease of native coronary artery without angina pectoris: Secondary | ICD-10-CM | POA: Diagnosis not present

## 2018-02-28 DIAGNOSIS — Z7982 Long term (current) use of aspirin: Secondary | ICD-10-CM | POA: Diagnosis not present

## 2018-02-28 DIAGNOSIS — E785 Hyperlipidemia, unspecified: Secondary | ICD-10-CM | POA: Diagnosis not present

## 2018-02-28 DIAGNOSIS — I255 Ischemic cardiomyopathy: Secondary | ICD-10-CM | POA: Diagnosis not present

## 2018-02-28 DIAGNOSIS — N289 Disorder of kidney and ureter, unspecified: Secondary | ICD-10-CM | POA: Diagnosis not present

## 2018-02-28 DIAGNOSIS — I4891 Unspecified atrial fibrillation: Secondary | ICD-10-CM | POA: Diagnosis not present

## 2018-02-28 DIAGNOSIS — R918 Other nonspecific abnormal finding of lung field: Secondary | ICD-10-CM | POA: Diagnosis not present

## 2018-03-01 DIAGNOSIS — E7800 Pure hypercholesterolemia, unspecified: Secondary | ICD-10-CM | POA: Diagnosis not present

## 2018-03-01 DIAGNOSIS — N183 Chronic kidney disease, stage 3 (moderate): Secondary | ICD-10-CM | POA: Diagnosis not present

## 2018-03-01 DIAGNOSIS — I5189 Other ill-defined heart diseases: Secondary | ICD-10-CM | POA: Diagnosis not present

## 2018-03-01 DIAGNOSIS — G309 Alzheimer's disease, unspecified: Secondary | ICD-10-CM | POA: Diagnosis not present

## 2018-03-01 DIAGNOSIS — I313 Pericardial effusion (noninflammatory): Secondary | ICD-10-CM | POA: Diagnosis not present

## 2018-03-01 DIAGNOSIS — I5022 Chronic systolic (congestive) heart failure: Secondary | ICD-10-CM | POA: Diagnosis not present

## 2018-03-01 DIAGNOSIS — I517 Cardiomegaly: Secondary | ICD-10-CM | POA: Diagnosis not present

## 2018-03-01 DIAGNOSIS — I4891 Unspecified atrial fibrillation: Secondary | ICD-10-CM | POA: Diagnosis not present

## 2018-03-01 DIAGNOSIS — I083 Combined rheumatic disorders of mitral, aortic and tricuspid valves: Secondary | ICD-10-CM | POA: Diagnosis not present

## 2018-03-01 DIAGNOSIS — I2510 Atherosclerotic heart disease of native coronary artery without angina pectoris: Secondary | ICD-10-CM | POA: Diagnosis not present

## 2018-03-01 DIAGNOSIS — I255 Ischemic cardiomyopathy: Secondary | ICD-10-CM | POA: Diagnosis not present

## 2018-03-01 DIAGNOSIS — R079 Chest pain, unspecified: Secondary | ICD-10-CM | POA: Diagnosis not present

## 2018-03-01 DIAGNOSIS — E785 Hyperlipidemia, unspecified: Secondary | ICD-10-CM | POA: Diagnosis not present

## 2018-03-02 DIAGNOSIS — I4891 Unspecified atrial fibrillation: Secondary | ICD-10-CM | POA: Diagnosis not present

## 2018-03-02 DIAGNOSIS — E785 Hyperlipidemia, unspecified: Secondary | ICD-10-CM | POA: Diagnosis not present

## 2018-03-02 DIAGNOSIS — I5022 Chronic systolic (congestive) heart failure: Secondary | ICD-10-CM | POA: Diagnosis not present

## 2018-03-02 DIAGNOSIS — I480 Paroxysmal atrial fibrillation: Secondary | ICD-10-CM | POA: Diagnosis not present

## 2018-03-02 DIAGNOSIS — I255 Ischemic cardiomyopathy: Secondary | ICD-10-CM | POA: Diagnosis not present

## 2018-03-02 DIAGNOSIS — G309 Alzheimer's disease, unspecified: Secondary | ICD-10-CM | POA: Diagnosis not present

## 2018-03-02 DIAGNOSIS — N183 Chronic kidney disease, stage 3 (moderate): Secondary | ICD-10-CM | POA: Diagnosis not present

## 2018-03-02 DIAGNOSIS — R079 Chest pain, unspecified: Secondary | ICD-10-CM | POA: Diagnosis not present

## 2018-03-02 DIAGNOSIS — I2510 Atherosclerotic heart disease of native coronary artery without angina pectoris: Secondary | ICD-10-CM | POA: Diagnosis not present

## 2018-03-03 DIAGNOSIS — E7800 Pure hypercholesterolemia, unspecified: Secondary | ICD-10-CM | POA: Diagnosis not present

## 2018-03-03 DIAGNOSIS — I5022 Chronic systolic (congestive) heart failure: Secondary | ICD-10-CM | POA: Diagnosis not present

## 2018-03-03 DIAGNOSIS — N183 Chronic kidney disease, stage 3 (moderate): Secondary | ICD-10-CM | POA: Diagnosis not present

## 2018-03-03 DIAGNOSIS — I4891 Unspecified atrial fibrillation: Secondary | ICD-10-CM | POA: Diagnosis not present

## 2018-03-03 DIAGNOSIS — G309 Alzheimer's disease, unspecified: Secondary | ICD-10-CM | POA: Diagnosis not present

## 2018-03-03 DIAGNOSIS — R079 Chest pain, unspecified: Secondary | ICD-10-CM | POA: Diagnosis not present

## 2018-03-03 DIAGNOSIS — I2510 Atherosclerotic heart disease of native coronary artery without angina pectoris: Secondary | ICD-10-CM | POA: Diagnosis not present

## 2018-03-03 DIAGNOSIS — I255 Ischemic cardiomyopathy: Secondary | ICD-10-CM | POA: Diagnosis not present

## 2018-03-04 DIAGNOSIS — I2510 Atherosclerotic heart disease of native coronary artery without angina pectoris: Secondary | ICD-10-CM | POA: Diagnosis not present

## 2018-03-04 DIAGNOSIS — N183 Chronic kidney disease, stage 3 (moderate): Secondary | ICD-10-CM | POA: Diagnosis not present

## 2018-03-04 DIAGNOSIS — I5022 Chronic systolic (congestive) heart failure: Secondary | ICD-10-CM | POA: Diagnosis not present

## 2018-03-04 DIAGNOSIS — G309 Alzheimer's disease, unspecified: Secondary | ICD-10-CM | POA: Diagnosis not present

## 2018-03-04 DIAGNOSIS — R079 Chest pain, unspecified: Secondary | ICD-10-CM | POA: Diagnosis not present

## 2018-03-05 DIAGNOSIS — G309 Alzheimer's disease, unspecified: Secondary | ICD-10-CM | POA: Diagnosis not present

## 2018-03-05 DIAGNOSIS — N183 Chronic kidney disease, stage 3 (moderate): Secondary | ICD-10-CM | POA: Diagnosis not present

## 2018-03-05 DIAGNOSIS — I5022 Chronic systolic (congestive) heart failure: Secondary | ICD-10-CM | POA: Diagnosis not present

## 2018-03-05 DIAGNOSIS — R079 Chest pain, unspecified: Secondary | ICD-10-CM | POA: Diagnosis not present

## 2018-03-05 DIAGNOSIS — I2510 Atherosclerotic heart disease of native coronary artery without angina pectoris: Secondary | ICD-10-CM | POA: Diagnosis not present

## 2018-03-06 DIAGNOSIS — I2510 Atherosclerotic heart disease of native coronary artery without angina pectoris: Secondary | ICD-10-CM | POA: Diagnosis not present

## 2018-03-06 DIAGNOSIS — N183 Chronic kidney disease, stage 3 (moderate): Secondary | ICD-10-CM | POA: Diagnosis not present

## 2018-03-06 DIAGNOSIS — G309 Alzheimer's disease, unspecified: Secondary | ICD-10-CM | POA: Diagnosis not present

## 2018-03-06 DIAGNOSIS — M2550 Pain in unspecified joint: Secondary | ICD-10-CM | POA: Diagnosis not present

## 2018-03-06 DIAGNOSIS — Z7401 Bed confinement status: Secondary | ICD-10-CM | POA: Diagnosis not present

## 2018-03-06 DIAGNOSIS — R0902 Hypoxemia: Secondary | ICD-10-CM | POA: Diagnosis not present

## 2018-03-06 DIAGNOSIS — R079 Chest pain, unspecified: Secondary | ICD-10-CM | POA: Diagnosis not present

## 2018-03-06 DIAGNOSIS — R0789 Other chest pain: Secondary | ICD-10-CM | POA: Diagnosis not present

## 2018-03-06 DIAGNOSIS — I5022 Chronic systolic (congestive) heart failure: Secondary | ICD-10-CM | POA: Diagnosis not present

## 2018-03-08 MED ORDER — METOPROLOL SUCCINATE ER 25 MG PO TB24
25.00 | ORAL_TABLET | ORAL | Status: DC
Start: 2018-03-07 — End: 2018-03-08

## 2018-03-08 MED ORDER — GENERIC EXTERNAL MEDICATION
10.00 | Status: DC
Start: ? — End: 2018-03-08

## 2018-03-08 MED ORDER — SENNA-DOCUSATE SODIUM 8.6-50 MG PO TABS
1.00 | ORAL_TABLET | ORAL | Status: DC
Start: ? — End: 2018-03-08

## 2018-03-08 MED ORDER — SODIUM CHLORIDE 0.9 % IV SOLN
10.00 | INTRAVENOUS | Status: DC
Start: ? — End: 2018-03-08

## 2018-03-08 MED ORDER — ASPIRIN EC 325 MG PO TBEC
325.00 | DELAYED_RELEASE_TABLET | ORAL | Status: DC
Start: 2018-03-07 — End: 2018-03-08

## 2018-03-08 MED ORDER — MELATONIN 1 MG PO TABS
0.50 | ORAL_TABLET | ORAL | Status: DC
Start: 2018-03-06 — End: 2018-03-08

## 2018-03-08 MED ORDER — NITROGLYCERIN 0.4 MG SL SUBL
0.40 | SUBLINGUAL_TABLET | SUBLINGUAL | Status: DC
Start: ? — End: 2018-03-08

## 2018-03-08 MED ORDER — MORPHINE SULFATE (PF) 2 MG/ML IV SOLN
2.00 | INTRAVENOUS | Status: DC
Start: ? — End: 2018-03-08

## 2018-03-08 MED ORDER — SERTRALINE HCL 25 MG PO TABS
25.00 | ORAL_TABLET | ORAL | Status: DC
Start: 2018-03-07 — End: 2018-03-08

## 2018-03-08 MED ORDER — GENERIC EXTERNAL MEDICATION
650.00 | Status: DC
Start: ? — End: 2018-03-08

## 2018-03-08 MED ORDER — VITAMIN B-12 1000 MCG PO TABS
1000.00 | ORAL_TABLET | ORAL | Status: DC
Start: 2018-03-07 — End: 2018-03-08

## 2018-03-08 MED ORDER — AMIODARONE HCL 200 MG PO TABS
200.00 | ORAL_TABLET | ORAL | Status: DC
Start: 2018-03-07 — End: 2018-03-08

## 2018-03-28 ENCOUNTER — Telehealth: Payer: Self-pay | Admitting: Internal Medicine

## 2018-03-28 NOTE — Telephone Encounter (Signed)
Copied from [place] [id number]. Topic: General - Deceased Patient >> 2018-03-30  2:51 PM Sheran Luz wrote: Reason for CRM: Aaron [name]' daughter called wanting to let office know that he has passed away.

## 2018-03-28 NOTE — Telephone Encounter (Signed)
Noted.  Medical records should be notified.

## 2018-04-01 NOTE — Telephone Encounter (Signed)
I have printed obit and forwarding to HIM at [facility]

## 2018-04-05 DEATH — deceased
